# Patient Record
Sex: Female | Born: 1990
Health system: Southern US, Community
[De-identification: ages and names within clinical notes are randomized; demographics above are authoritative.]

## PROBLEM LIST (undated history)

## (undated) DIAGNOSIS — G43909 Migraine, unspecified, not intractable, without status migrainosus: Secondary | ICD-10-CM

## (undated) DIAGNOSIS — R32 Unspecified urinary incontinence: Secondary | ICD-10-CM

## (undated) DIAGNOSIS — J45909 Unspecified asthma, uncomplicated: Secondary | ICD-10-CM

## (undated) DIAGNOSIS — F32A Depression, unspecified: Secondary | ICD-10-CM

## (undated) DIAGNOSIS — F329 Major depressive disorder, single episode, unspecified: Secondary | ICD-10-CM

## (undated) DIAGNOSIS — T7840XA Allergy, unspecified, initial encounter: Secondary | ICD-10-CM

## (undated) DIAGNOSIS — R569 Unspecified convulsions: Secondary | ICD-10-CM

## (undated) DIAGNOSIS — F419 Anxiety disorder, unspecified: Secondary | ICD-10-CM

## (undated) HISTORY — DX: Allergy, unspecified, initial encounter: T78.40XA

## (undated) HISTORY — DX: Anxiety disorder, unspecified: F41.9

## (undated) HISTORY — DX: Unspecified urinary incontinence: R32

## (undated) HISTORY — DX: Major depressive disorder, single episode, unspecified: F32.9

## (undated) HISTORY — DX: Depression, unspecified: F32.A

## (undated) HISTORY — DX: Unspecified convulsions: R56.9

## (undated) HISTORY — PX: WISDOM TOOTH EXTRACTION: SHX21

## (undated) HISTORY — DX: Migraine, unspecified, not intractable, without status migrainosus: G43.909

## (undated) HISTORY — DX: Unspecified asthma, uncomplicated: J45.909

---
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
## 2012-10-18 ENCOUNTER — Other Ambulatory Visit: Payer: Self-pay | Admitting: Family Medicine

## 2012-10-18 DIAGNOSIS — R109 Unspecified abdominal pain: Secondary | ICD-10-CM

## 2012-10-19 ENCOUNTER — Ambulatory Visit
Admission: RE | Admit: 2012-10-19 | Discharge: 2012-10-19 | Disposition: A | Discharge status: Home / Self Care | Payer: BC Managed Care – PPO | Source: Ambulatory Visit | Attending: Family Medicine | Admitting: Family Medicine

## 2012-10-19 DIAGNOSIS — R109 Unspecified abdominal pain: Secondary | ICD-10-CM

## 2012-10-22 ENCOUNTER — Other Ambulatory Visit: Payer: Self-pay

## 2014-10-09 ENCOUNTER — Ambulatory Visit (HOSPITAL_COMMUNITY): Payer: Self-pay | Admitting: Psychiatry

## 2015-01-16 ENCOUNTER — Encounter: Payer: Self-pay | Admitting: Internal Medicine

## 2015-01-16 ENCOUNTER — Other Ambulatory Visit (INDEPENDENT_AMBULATORY_CARE_PROVIDER_SITE_OTHER): Payer: 59

## 2015-01-16 ENCOUNTER — Ambulatory Visit (INDEPENDENT_AMBULATORY_CARE_PROVIDER_SITE_OTHER): Payer: 59 | Admitting: Internal Medicine

## 2015-01-16 VITALS — BP 102/76 | HR 80 | Temp 97.8°F | Resp 16 | Ht 65.0 in | Wt 215.0 lb

## 2015-01-16 DIAGNOSIS — Z0000 Encounter for general adult medical examination without abnormal findings: Secondary | ICD-10-CM | POA: Diagnosis not present

## 2015-01-16 DIAGNOSIS — F4323 Adjustment disorder with mixed anxiety and depressed mood: Secondary | ICD-10-CM | POA: Diagnosis not present

## 2015-01-16 DIAGNOSIS — E669 Obesity, unspecified: Secondary | ICD-10-CM | POA: Insufficient documentation

## 2015-01-16 DIAGNOSIS — R7989 Other specified abnormal findings of blood chemistry: Secondary | ICD-10-CM | POA: Diagnosis not present

## 2015-01-16 LAB — CBC
HCT: 39.9 % (ref 36.0–46.0)
HEMOGLOBIN: 13.6 g/dL (ref 12.0–15.0)
MCHC: 34 g/dL (ref 30.0–36.0)
MCV: 82.4 fl (ref 78.0–100.0)
PLATELETS: 335 10*3/uL (ref 150.0–400.0)
RBC: 4.85 Mil/uL (ref 3.87–5.11)
RDW: 14.1 % (ref 11.5–15.5)
WBC: 9.2 10*3/uL (ref 4.0–10.5)

## 2015-01-16 LAB — COMPREHENSIVE METABOLIC PANEL
ALBUMIN: 3.8 g/dL (ref 3.5–5.2)
ALT: 12 U/L (ref 0–35)
AST: 15 U/L (ref 0–37)
Alkaline Phosphatase: 51 U/L (ref 39–117)
BUN: 8 mg/dL (ref 6–23)
CALCIUM: 9.3 mg/dL (ref 8.4–10.5)
CHLORIDE: 105 meq/L (ref 96–112)
CO2: 22 meq/L (ref 19–32)
Creatinine, Ser: 0.61 mg/dL (ref 0.40–1.20)
GFR: 128.78 mL/min (ref >60.00)
GLUCOSE: 90 mg/dL (ref 70–99)
POTASSIUM: 3.8 meq/L (ref 3.5–5.1)
Sodium: 137 mEq/L (ref 135–145)
TOTAL PROTEIN: 7.1 g/dL (ref 6.0–8.3)
Total Bilirubin: 0.3 mg/dL (ref 0.2–1.2)

## 2015-01-16 LAB — LIPID PANEL
CHOL/HDL RATIO: 4
Cholesterol: 204 mg/dL — ABNORMAL HIGH (ref 0–200)
HDL: 47.8 mg/dL (ref >39.00)
NonHDL: 156.2
Triglycerides: 358 mg/dL — ABNORMAL HIGH (ref 0.0–149.0)
VLDL: 71.6 mg/dL — AB (ref 0.0–40.0)

## 2015-01-16 LAB — LDL CHOLESTEROL, DIRECT: LDL DIRECT: 115 mg/dL

## 2015-01-16 LAB — HEMOGLOBIN A1C: Hgb A1c MFr Bld: 4.7 % (ref 4.6–6.5)

## 2015-01-16 LAB — TSH: TSH: 4.54 u[IU]/mL — ABNORMAL HIGH (ref 0.35–4.50)

## 2015-01-16 MED ORDER — BUPROPION HCL ER (SR) 150 MG PO TB12: 150.0000 mg | ORAL_TABLET | Freq: Two times a day (BID) | ORAL | Status: DC

## 2015-01-16 NOTE — Patient Instructions (Signed)
We have sent in wellbutrin for the anxiety. For the first 5 days take 1 pill in the morning with breakfast. Then take 1 pill with breakfast and 1 pill with dinner. It can take 6-8 weeks for it to get to maximum strength in your body.   It does have a very rare side effect of causing suicidal thoughts and if you get this stop taking the medicine immediately and call our office or seek help.   Come back in about 2-3 months to check on how you are doing. If you have problems or questions before then please call the office.   We will check your blood work today and call you back with the results.   Think about going back to your therapist during this stressful time.  Stress and Stress Management Stress is a normal reaction to life events. It is what you feel when life demands more than you are used to or more than you can handle. Some stress can be useful. For example, the stress reaction can help you catch the last bus of the day, study for a test, or meet a deadline at work. But stress that occurs too often or for too long can cause problems. It can affect your emotional health and interfere with relationships and normal daily activities. Too much stress can weaken your immune system and increase your risk for physical illness. If you already have a medical problem, stress can make it worse. CAUSES  All sorts of life events may cause stress. An event that causes stress for one person may not be stressful for another person. Major life events commonly cause stress. These may be positive or negative. Examples include losing your job, moving into a new home, getting married, having a baby, or losing a loved one. Less obvious life events may also cause stress, especially if they occur day after day or in combination. Examples include working long hours, driving in traffic, caring for children, being in debt, or being in a difficult relationship. SIGNS AND SYMPTOMS Stress may cause emotional symptoms  including, the following:  Anxiety. This is feeling worried, afraid, on edge, overwhelmed, or out of control.  Anger. This is feeling irritated or impatient.  Depression. This is feeling sad, down, helpless, or guilty.  Difficulty focusing, remembering, or making decisions. Stress may cause physical symptoms, including the following:   Aches and pains. These may affect your head, neck, back, stomach, or other areas of your body.  Tight muscles or clenched jaw.  Low energy or trouble sleeping. Stress may cause unhealthy behaviors, including the following:   Eating to feel better (overeating) or skipping meals.  Sleeping too little, too much, or both.  Working too much or putting off tasks (procrastination).  Smoking, drinking alcohol, or using drugs to feel better. DIAGNOSIS  Stress is diagnosed through an assessment by your health care provider. Your health care provider will ask questions about your symptoms and any stressful life events.Your health care provider will also ask about your medical history and may order blood tests or other tests. Certain medical conditions and medicine can cause physical symptoms similar to stress. Mental illness can cause emotional symptoms and unhealthy behaviors similar to stress. Your health care provider may refer you to a mental health professional for further evaluation.  TREATMENT  Stress management is the recommended treatment for stress.The goals of stress management are reducing stressful life events and coping with stress in healthy ways.  Techniques for reducing stressful life events  include the following:  Stress identification. Self-monitor for stress and identify what causes stress for you. These skills may help you to avoid some stressful events.  Time management. Set your priorities, keep a calendar of events, and learn to say "no." These tools can help you avoid making too many commitments. Techniques for coping with stress  include the following:  Rethinking the problem. Try to think realistically about stressful events rather than ignoring them or overreacting. Try to find the positives in a stressful situation rather than focusing on the negatives.  Exercise. Physical exercise can release both physical and emotional tension. The key is to find a form of exercise you enjoy and do it regularly.  Relaxation techniques. These relax the body and mind. Examples include yoga, meditation, tai chi, biofeedback, deep breathing, progressive muscle relaxation, listening to music, being out in nature, journaling, and other hobbies. Again, the key is to find one or more that you enjoy and can do regularly.  Healthy lifestyle. Eat a balanced diet, get plenty of sleep, and do not smoke. Avoid using alcohol or drugs to relax.  Strong support network. Spend time with family, friends, or other people you enjoy being around.Express your feelings and talk things over with someone you trust. Counseling or talktherapy with a mental health professional may be helpful if you are having difficulty managing stress on your own. Medicine is typically not recommended for the treatment of stress.Talk to your health care provider if you think you need medicine for symptoms of stress. HOME CARE INSTRUCTIONS  Keep all follow-up visits as directed by your health care provider.  Take all medicines as directed by your health care provider. SEEK MEDICAL CARE IF:  Your symptoms get worse or you start having new symptoms.  You feel overwhelmed by your problems and can no longer manage them on your own. SEEK IMMEDIATE MEDICAL CARE IF:  You feel like hurting yourself or someone else. Document Released: 01/07/2001 Document Revised: 11/28/2013 Document Reviewed: 03/08/2013 [facility] Patient Information 2015 [place], [place]. This information is not intended to replace advice given to you by your health care provider. Make sure you discuss any  questions you have with your health care provider.

## 2015-01-16 NOTE — Assessment & Plan Note (Signed)
Checking labs today for signs of complications from her weight. BP okay today.

## 2015-01-16 NOTE — Progress Notes (Signed)
Pre visit review using our clinic review tool, if applicable. No additional management support is needed unless otherwise documented below in the visit note. 

## 2015-01-16 NOTE — Assessment & Plan Note (Addendum)
The patient has tried several agents in the past. Will try wellbutrin as she wishes to avoid weight gain. Advised her that she needs to go back to counseling as she is going through a lot right now. Talked to her about the need to avoid medications like xanax and other benzos as she is young and these can be habit forming medicines.

## 2015-01-16 NOTE — Progress Notes (Signed)
   Subjective:    Patient ID: Carolyn Miranda, female    DOB: 1990-08-24, 24 y.o.   MRN: [id number]  HPI The patient is a 24 YO female here for anxiety. She is dealing with several new life stressors right now including loss of a friend, moving back in with her mom, and being jobless right now. She has been tried on zoloft in the past which made her feel like a zombie and prozac which did not do well. See has seen a therapist in the past but has not been in more than 6 months. It has been helpful to her in the past. She does get increasing thoughts and anxiety from life events and sometimes it keeps her from sleeping.   PMH, [facility], social history reviewed and updated during visit.   Review of Systems  Constitutional: Negative for fever, activity change and fatigue.  HENT: Negative.   Eyes: Negative.   Respiratory: Negative for cough, chest tightness, shortness of breath and wheezing.   Cardiovascular: Negative for chest pain, palpitations and leg swelling.  Gastrointestinal: Negative for nausea, abdominal pain, diarrhea, constipation and abdominal distention.  Musculoskeletal: Negative.   Skin: Negative.   Neurological: Negative.   Psychiatric/Behavioral: Positive for sleep disturbance, dysphoric mood and agitation. Negative for suicidal ideas, behavioral problems, self-injury and decreased concentration. The patient is nervous/anxious and is hyperactive.       Objective:   Physical Exam  Constitutional: She is oriented to person, place, and time. She appears well-developed and well-nourished.  Overweight  HENT:  Head: Normocephalic and atraumatic.  Eyes: EOM are normal.  Neck: Normal range of motion.  Cardiovascular: Normal rate and regular rhythm.   Pulmonary/Chest: Effort normal. No respiratory distress. She has no wheezes. She has no rales.  Abdominal: Soft. She exhibits no distension. There is no tenderness. There is no rebound.  Musculoskeletal: She exhibits no edema.    Neurological: She is alert and oriented to person, place, and time. Coordination normal.  Skin: Skin is warm and dry.  Psychiatric: She has a normal mood and affect.   Filed Vitals:   01/16/15 0856  BP: 102/76  Pulse: 80  Temp: 97.8 F (36.6 C)  TempSrc: Oral  Resp: 16  Height: 5\' 5"  (1.651 m)  Weight: 215 lb (97.523 kg)  SpO2: 98%      Assessment & Plan:

## 2015-01-19 ENCOUNTER — Other Ambulatory Visit: Payer: Self-pay | Admitting: Internal Medicine

## 2015-01-19 ENCOUNTER — Telehealth: Payer: Self-pay | Admitting: Internal Medicine

## 2015-01-19 DIAGNOSIS — R7989 Other specified abnormal findings of blood chemistry: Secondary | ICD-10-CM

## 2015-01-19 NOTE — Telephone Encounter (Signed)
Please call patient with her lab results.

## 2015-01-25 ENCOUNTER — Other Ambulatory Visit (INDEPENDENT_AMBULATORY_CARE_PROVIDER_SITE_OTHER): Payer: 59

## 2015-01-25 DIAGNOSIS — R7989 Other specified abnormal findings of blood chemistry: Secondary | ICD-10-CM

## 2015-01-25 LAB — T4, FREE: FREE T4: 0.59 ng/dL — AB (ref 0.60–1.60)

## 2015-02-18 ENCOUNTER — Other Ambulatory Visit: Payer: Self-pay | Admitting: Internal Medicine

## 2015-02-19 ENCOUNTER — Telehealth: Payer: Self-pay | Admitting: Internal Medicine

## 2015-02-19 NOTE — Telephone Encounter (Signed)
Pharmacy has sent a request in for Trinessa. Is this the same as the ortho tri cyclen that she always take? Please advise

## 2015-03-16 ENCOUNTER — Other Ambulatory Visit: Payer: Self-pay | Admitting: Internal Medicine

## 2015-03-19 ENCOUNTER — Ambulatory Visit: Payer: 59 | Admitting: Internal Medicine

## 2015-03-21 ENCOUNTER — Ambulatory Visit: Payer: 59 | Admitting: Internal Medicine

## 2015-04-03 ENCOUNTER — Ambulatory Visit: Payer: 59 | Admitting: Internal Medicine

## 2015-04-12 ENCOUNTER — Other Ambulatory Visit: Payer: Self-pay | Admitting: Internal Medicine

## 2015-04-12 ENCOUNTER — Ambulatory Visit: Payer: 59 | Admitting: Internal Medicine

## 2015-04-20 ENCOUNTER — Encounter: Payer: Self-pay | Admitting: Family

## 2015-04-20 ENCOUNTER — Ambulatory Visit (INDEPENDENT_AMBULATORY_CARE_PROVIDER_SITE_OTHER): Payer: 59 | Admitting: Family

## 2015-04-20 VITALS — BP 138/88 | HR 99 | Temp 97.6°F | Ht 65.0 in | Wt 207.0 lb

## 2015-04-20 DIAGNOSIS — J4520 Mild intermittent asthma, uncomplicated: Secondary | ICD-10-CM | POA: Diagnosis not present

## 2015-04-20 DIAGNOSIS — J069 Acute upper respiratory infection, unspecified: Secondary | ICD-10-CM

## 2015-04-20 DIAGNOSIS — J45909 Unspecified asthma, uncomplicated: Secondary | ICD-10-CM | POA: Insufficient documentation

## 2015-04-20 MED ORDER — ALBUTEROL SULFATE HFA 108 (90 BASE) MCG/ACT IN AERS: 2.0000 | INHALATION_SPRAY | Freq: Four times a day (QID) | RESPIRATORY_TRACT | Status: DC | PRN

## 2015-04-20 MED ORDER — AZITHROMYCIN 250 MG PO TABS
ORAL_TABLET | ORAL | Status: DC
Start: 2015-04-20 — End: 2015-08-24

## 2015-04-20 NOTE — Progress Notes (Signed)
Pre visit review using our clinic review tool, if applicable. No additional management support is needed unless otherwise documented below in the visit note. 

## 2015-04-20 NOTE — Patient Instructions (Addendum)
Thank you for choosing [facility].  Summary/Instructions:  Your prescription(s) have been submitted to your pharmacy or been printed and provided for you. Please take as directed and contact our office if you believe you are having problem(s) with the medication(s) or have any questions.  If your symptoms worsen or fail to improve, please contact our office for further instruction, or in case of emergency go directly to the emergency room at the closest medical facility.   General Recommendations:    Please drink plenty of fluids.  Get plenty of rest   Sleep in humidified air  Use saline nasal sprays  Netti pot   OTC Medications:  Decongestants - helps relieve congestion   Flonase (generic fluticasone) or Nasacort (generic triamcinolone) - please make sure to use the "cross-over" technique at a 45 degree angle towards the opposite eye as opposed to straight up the nasal passageway.   Sudafed (generic pseudoephedrine - Note this is the one that is available behind the pharmacy counter); Products with phenylephrine (-PE) may also be used but is often not as effective as pseudoephedrine.   If you have HIGH BLOOD PRESSURE - Coricidin HBP; AVOID any product that is -D as this contains pseudoephedrine which may increase your blood pressure.  Afrin (oxymetazoline) every 6-8 hours for up to 3 days.   Allergies - helps relieve runny nose, itchy eyes and sneezing   Claritin (generic loratidine), Allegra (fexofenidine), or Zyrtec (generic cyrterizine) for runny nose. These medications should not cause drowsiness.  Note - Benadryl (generic diphenhydramine) may be used however may cause drowsiness  Cough -   Delsym or Robitussin (generic dextromethorphan)  Expectorants - helps loosen mucus to ease removal   Mucinex (generic guaifenesin) as directed on the package.  Headaches / General Aches   Tylenol (generic acetaminophen) - DO NOT EXCEED 3 grams (3,000 mg) in a 24  hour time period  Advil/Motrin (generic ibuprofen)   Sore Throat -   Salt water gargle   Chloraseptic (generic benzocaine) spray or lozenges / Sucrets (generic dyclonine)    Upper Respiratory Infection, Adult An upper respiratory infection (URI) is also sometimes known as the common cold. The upper respiratory tract includes the nose, sinuses, throat, trachea, and bronchi. Bronchi are the airways leading to the lungs. Most people improve within 1 week, but symptoms can last up to 2 weeks. A residual cough may last even longer.  CAUSES Many different viruses can infect the tissues lining the upper respiratory tract. The tissues become irritated and inflamed and often become very moist. Mucus production is also common. A cold is contagious. You can easily spread the virus to others by oral contact. This includes kissing, sharing a glass, coughing, or sneezing. Touching your mouth or nose and then touching a surface, which is then touched by another person, can also spread the virus. SYMPTOMS  Symptoms typically develop 1 to 3 days after you come in contact with a cold virus. Symptoms vary from person to person. They may include:  Runny nose.  Sneezing.  Nasal congestion.  Sinus irritation.  Sore throat.  Loss of voice (laryngitis).  Cough.  Fatigue.  Muscle aches.  Loss of appetite.  Headache.  Low-grade fever. DIAGNOSIS  You might diagnose your own cold based on familiar symptoms, since most people get a cold 2 to 3 times a year. Your caregiver can confirm this based on your exam. Most importantly, your caregiver can check that your symptoms are not due to another disease such as   strep throat, sinusitis, pneumonia, asthma, or epiglottitis. Blood tests, throat tests, and X-rays are not necessary to diagnose a common cold, but they may sometimes be helpful in excluding other more serious diseases. Your caregiver will decide if any further tests are required. RISKS AND  COMPLICATIONS  You may be at risk for a more severe case of the common cold if you smoke cigarettes, have chronic heart disease (such as heart failure) or lung disease (such as asthma), or if you have a weakened immune system. The very young and very old are also at risk for more serious infections. Bacterial sinusitis, middle ear infections, and bacterial pneumonia can complicate the common cold. The common cold can worsen asthma and chronic obstructive pulmonary disease (COPD). Sometimes, these complications can require emergency medical care and may be life-threatening. PREVENTION  The best way to protect against getting a cold is to practice good hygiene. Avoid oral or hand contact with people with cold symptoms. Wash your hands often if contact occurs. There is no clear evidence that vitamin C, vitamin E, echinacea, or exercise reduces the chance of developing a cold. However, it is always recommended to get plenty of rest and practice good nutrition. TREATMENT  Treatment is directed at relieving symptoms. There is no cure. Antibiotics are not effective, because the infection is caused by a virus, not by bacteria. Treatment may include:  Increased fluid intake. Sports drinks offer valuable electrolytes, sugars, and fluids.  Breathing heated mist or steam (vaporizer or shower).  Eating chicken soup or other clear broths, and maintaining good nutrition.  Getting plenty of rest.  Using gargles or lozenges for comfort.  Controlling fevers with ibuprofen or acetaminophen as directed by your caregiver.  Increasing usage of your inhaler if you have asthma. Zinc gel and zinc lozenges, taken in the first 24 hours of the common cold, can shorten the duration and lessen the severity of symptoms. Pain medicines may help with fever, muscle aches, and throat pain. A variety of non-prescription medicines are available to treat congestion and runny nose. Your caregiver can make recommendations and may  suggest nasal or lung inhalers for other symptoms.  HOME CARE INSTRUCTIONS   Only take over-the-counter or prescription medicines for pain, discomfort, or fever as directed by your caregiver.  Use a warm mist humidifier or inhale steam from a shower to increase air moisture. This may keep secretions moist and make it easier to breathe.  Drink enough water and fluids to keep your urine clear or pale yellow.  Rest as needed.  Return to work when your temperature has returned to normal or as your caregiver advises. You may need to stay home longer to avoid infecting others. You can also use a face mask and careful hand washing to prevent spread of the virus. SEEK MEDICAL CARE IF:   After the first few days, you feel you are getting worse rather than better.  You need your caregiver's advice about medicines to control symptoms.  You develop chills, worsening shortness of breath, or brown or red sputum. These may be signs of pneumonia.  You develop yellow or brown nasal discharge or pain in the face, especially when you bend forward. These may be signs of sinusitis.  You develop a fever, swollen neck glands, pain with swallowing, or white areas in the back of your throat. These may be signs of strep throat. SEEK IMMEDIATE MEDICAL CARE IF:   You have a fever.  You develop severe or persistent headache, ear pain,   sinus pain, or chest pain.  You develop wheezing, a prolonged cough, cough up blood, or have a change in your usual mucus (if you have chronic lung disease).  You develop sore muscles or a stiff neck. Document Released: 01/07/2001 Document Revised: 10/06/2011 Document Reviewed: 10/19/2013 [employer] Patient Information 2015 [employer]. This information is not intended to replace advice given to you by your health care provider. Make sure you discuss any questions you have with your health care provider.   

## 2015-04-20 NOTE — Assessment & Plan Note (Signed)
Symptoms and exam consistent with acute upper respiratory infection most likely viral. Prescription for azithromycin provided to patient with instructions for watchful waiting. Continue over-the-counter medications as needed for symptom relief and supportive care. Start antibiotic if symptoms worsen. Follow up if symptoms worsen or fail to improve.

## 2015-04-20 NOTE — Assessment & Plan Note (Signed)
Requests refill of albuterol. Prescription sent to pharmacy.

## 2015-04-20 NOTE — Progress Notes (Signed)
Subjective:    Patient ID: Carolyn Miranda, female    DOB: 11-29-1990, 24 y.o.   MRN: [id number]  Chief Complaint  Patient presents with  . Cough    Congestion, fatigue, and cough x 1 week     HPI:  Carolyn Miranda is a 24 y.o. female who  has a past medical history of Anxiety; Asthma; and Depression. and presents today for an acute office visit.   This is a new problem. Associated symptoms fatigue, congestion, and mild cough that has been going on for about 1 week. Modifying factors include Dayquil which did not help very much. Course of the illness is with improvement since the weekend. Timing of the symptoms is worse in the morning with coughing. Believes it is effecting her asthma which has been worse. Denies any recent antibiotic use.   No Known Allergies   Current Outpatient Prescriptions on File Prior to Visit  Medication Sig Dispense Refill  . ORTHO TRI-CYCLEN, 28, 0.18/0.215/0.25 MG-35 MCG tablet USE AS DIRECTED, NEED APPOINTMENT FOR ADDITONAL REFILLS 28 tablet 0   No current facility-administered medications on file prior to visit.    No past surgical history on file.   Review of Systems  Constitutional: Negative for fever and chills.  HENT: Positive for congestion, rhinorrhea and sneezing. Negative for ear pain, sinus pressure and sore throat.   Respiratory: Positive for shortness of breath. Negative for chest tightness and wheezing.   Cardiovascular: Negative for chest pain, palpitations and leg swelling.  Neurological: Negative for headaches.      Objective:    BP 138/88 mmHg  Pulse 99  Temp(Src) 97.6 F (36.4 C) (Oral)  Ht  (1.651 m)  Wt 207 lb (93.895 kg)  BMI 34.45 kg/m2  SpO2 97%  LMP 04/16/2015 Nursing note and vital signs reviewed.  Physical Exam  Constitutional: She is oriented to person, place, and time. She appears well-developed and well-nourished. No distress.  HENT:  Right Ear: Hearing, tympanic membrane, external ear and ear  canal normal.  Left Ear: Hearing, tympanic membrane, external ear and ear canal normal.  Nose: Nose normal. Right sinus exhibits no maxillary sinus tenderness and no frontal sinus tenderness. Left sinus exhibits no maxillary sinus tenderness and no frontal sinus tenderness.  Mouth/Throat: Uvula is midline, oropharynx is clear and moist and mucous membranes are normal.  Neck: Neck supple.  Cardiovascular: Normal rate, regular rhythm, normal heart sounds and intact distal pulses.   Pulmonary/Chest: Effort normal and breath sounds normal. She has no wheezes.  Lymphadenopathy:    She has no cervical adenopathy.  Neurological: She is alert and oriented to person, place, and time.  Skin: Skin is warm and dry.  Psychiatric: She has a normal mood and affect. Her behavior is normal. Judgment and thought content normal.       Assessment & Plan:   Problem List Items Addressed This Visit      Respiratory   Acute upper respiratory infection - Primary    Symptoms and exam consistent with acute upper respiratory infection most likely viral. Prescription for azithromycin provided to patient with instructions for watchful waiting. Continue over-the-counter medications as needed for symptom relief and supportive care. Start antibiotic if symptoms worsen. Follow up if symptoms worsen or fail to improve.      Relevant Medications   azithromycin (ZITHROMAX) 250 MG tablet   Asthma    Requests refill of albuterol. Prescription sent to pharmacy.       Relevant Medications  albuterol (PROVENTIL HFA;VENTOLIN HFA) 108 (90 BASE) MCG/ACT inhaler

## 2015-04-27 ENCOUNTER — Telehealth: Payer: Self-pay | Admitting: *Deleted

## 2015-04-27 NOTE — Telephone Encounter (Signed)
Called pt again still no answer LMOM if needing please return call. Closing encounter...Raechel Chute

## 2015-04-27 NOTE — Telephone Encounter (Signed)
Left msg on triage Thursday afternoon @ 4:45pm stating not feeling any better wanting to speak with nurse. Called pt this am no answer LMOM RTC...Raechel Chute

## 2015-05-12 ENCOUNTER — Other Ambulatory Visit: Payer: Self-pay | Admitting: Internal Medicine

## 2015-06-10 ENCOUNTER — Other Ambulatory Visit: Payer: Self-pay | Admitting: Internal Medicine

## 2015-08-22 ENCOUNTER — Other Ambulatory Visit: Payer: Self-pay | Admitting: Internal Medicine

## 2015-08-24 ENCOUNTER — Encounter: Payer: Self-pay | Admitting: Internal Medicine

## 2015-08-24 ENCOUNTER — Ambulatory Visit (INDEPENDENT_AMBULATORY_CARE_PROVIDER_SITE_OTHER): Payer: BLUE CROSS/BLUE SHIELD | Admitting: Internal Medicine

## 2015-08-24 VITALS — BP 120/80 | HR 73 | Temp 97.4°F | Ht 65.0 in | Wt 202.0 lb

## 2015-08-24 DIAGNOSIS — Z308 Encounter for other contraceptive management: Secondary | ICD-10-CM | POA: Diagnosis not present

## 2015-08-24 DIAGNOSIS — F4323 Adjustment disorder with mixed anxiety and depressed mood: Secondary | ICD-10-CM | POA: Diagnosis not present

## 2015-08-24 DIAGNOSIS — Z309 Encounter for contraceptive management, unspecified: Secondary | ICD-10-CM | POA: Insufficient documentation

## 2015-08-24 MED ORDER — BUSPIRONE HCL 5 MG PO TABS: 5.0000 mg | ORAL_TABLET | Freq: Two times a day (BID) | ORAL | Status: DC | PRN

## 2015-08-24 NOTE — Progress Notes (Signed)
   Subjective:    Patient ID: Carolyn Miranda, female    DOB: 21-Apr-1991, 25 y.o.   MRN: [id number]  HPI The patient is a 25 YO female coming in to talk about birth control. She is taking OCP pills right now and has a hard time remembering to take them. She has heard about mirena from a a friend and wants to know more about it. Not currently sexually active. No concerns about pregnancy.   Review of Systems  Constitutional: Negative for fever, activity change and fatigue.  Respiratory: Negative for cough, chest tightness, shortness of breath and wheezing.   Cardiovascular: Negative for chest pain, palpitations and leg swelling.  Gastrointestinal: Negative for nausea, abdominal pain, diarrhea, constipation and abdominal distention.  Musculoskeletal: Negative.   Skin: Negative.   Neurological: Negative.   Psychiatric/Behavioral: Positive for dysphoric mood. Negative for suicidal ideas, behavioral problems, self-injury and decreased concentration. The patient is nervous/anxious.       Objective:   Physical Exam  Constitutional: She is oriented to person, place, and time. She appears well-developed and well-nourished.  Overweight  HENT:  Head: Normocephalic and atraumatic.  Eyes: EOM are normal.  Neck: Normal range of motion.  Cardiovascular: Normal rate and regular rhythm.   Pulmonary/Chest: Effort normal. No respiratory distress. She has no wheezes. She has no rales.  Abdominal: Soft. She exhibits no distension. There is no tenderness. There is no rebound.  Musculoskeletal: She exhibits no edema.  Neurological: She is alert and oriented to person, place, and time. Coordination normal.  Skin: Skin is warm and dry.  Psychiatric: She has a normal mood and affect.   Filed Vitals:   08/24/15 1115  BP: 120/80  Pulse: 73  Temp: 97.4 F (36.3 C)  TempSrc: Oral  Height:  (1.651 m)  Weight: 202 lb (91.627 kg)  SpO2: 95%      Assessment & Plan:

## 2015-08-24 NOTE — Progress Notes (Signed)
Pre visit review using our clinic review tool, if applicable. No additional management support is needed unless otherwise documented below in the visit note. 

## 2015-08-24 NOTE — Assessment & Plan Note (Signed)
Currently taking OCP pills but has a hard time remembering to take daily. Talked to her about various other options and information provided. Recommended that she see a gynecologist who could do the IUD or implant if she decides to switch. She will do that.

## 2015-08-24 NOTE — Patient Instructions (Signed)
We have sent in the buspar which you can take as needed for anxiety.   We have given you information about birth control options. It would be a good idea to get a gynecologist to talk to birth controls. [facility] and [facility] are good groups that are taking new patients. They both have websites that have pictures of the doctors.

## 2015-08-24 NOTE — Assessment & Plan Note (Signed)
Did not do well with wellbutrin and does not wish to try additional agent at this time. Rx for buspar for as needed. Recommended to start seeing her counselor again for help.

## 2015-08-27 ENCOUNTER — Telehealth: Payer: Self-pay | Admitting: Internal Medicine

## 2015-08-27 ENCOUNTER — Other Ambulatory Visit: Payer: Self-pay

## 2015-08-27 ENCOUNTER — Other Ambulatory Visit: Payer: Self-pay | Admitting: Internal Medicine

## 2015-08-27 MED ORDER — ORTHO TRI-CYCLEN (28) 0.18/0.215/0.25 MG-35 MCG PO TABS: 1.0000 | ORAL_TABLET | Freq: Every day | ORAL | Status: DC

## 2015-08-27 NOTE — Telephone Encounter (Signed)
erx sent.   My chart message also sent.

## 2015-08-27 NOTE — Telephone Encounter (Signed)
Pt mother called in and said that there birth control needs to called in to pharmacy for the generic and also her med for anxiety   Pharmacy - [facility] [address]

## 2015-08-27 NOTE — Telephone Encounter (Signed)
Medications have already been sent.  My chart message sent stating the same.

## 2015-08-27 NOTE — Telephone Encounter (Signed)
erx resent.   My chart sent with same information.

## 2015-08-27 NOTE — Telephone Encounter (Signed)
Patient called to request that we send buspar and ortho tricyclen to [facility] on [address]. She also states that they would not allow her to get the generics because of the way that we send them in.

## 2015-09-14 ENCOUNTER — Ambulatory Visit (INDEPENDENT_AMBULATORY_CARE_PROVIDER_SITE_OTHER): Payer: BLUE CROSS/BLUE SHIELD | Admitting: Nurse Practitioner

## 2015-09-14 ENCOUNTER — Encounter: Payer: Self-pay | Admitting: Nurse Practitioner

## 2015-09-14 VITALS — BP 124/88 | HR 96 | Temp 97.8°F | Resp 18 | Wt 200.0 lb

## 2015-09-14 DIAGNOSIS — J4520 Mild intermittent asthma, uncomplicated: Secondary | ICD-10-CM | POA: Diagnosis not present

## 2015-09-14 DIAGNOSIS — J069 Acute upper respiratory infection, unspecified: Secondary | ICD-10-CM | POA: Diagnosis not present

## 2015-09-14 MED ORDER — ALBUTEROL SULFATE HFA 108 (90 BASE) MCG/ACT IN AERS: 2.0000 | INHALATION_SPRAY | Freq: Four times a day (QID) | RESPIRATORY_TRACT | Status: DC | PRN

## 2015-09-14 MED ORDER — AZITHROMYCIN 250 MG PO TABS: ORAL_TABLET | ORAL | Status: DC

## 2015-09-14 NOTE — Patient Instructions (Addendum)
Inhaler first!  Z-pack if fever of 100.5 or greater and cough gets worse (Use back up birth control if use the antibiotic)   Continue OTC Nyquil/Dayquil   Seek emergency care if you worsen into an asthma attack or can't complete sentences.

## 2015-09-14 NOTE — Assessment & Plan Note (Addendum)
New onset Will treat conservatively due to probable viral nature Inhaler sent to pharmacy  Mucinex plain and benadryl at night encouraged  Antibiotic prescription sent to pharmacy in case of no improvement or fever within 48 hours. Pt verbalized understanding of need to hold.  FU prn worsening/failure to improve.

## 2015-09-14 NOTE — Progress Notes (Signed)
Patient ID: Carolyn Miranda, female    DOB: 12/14/1990  Age: 25 y.o. MRN: [id number]  CC: Sore Throat   HPI Carolyn Miranda presents for ST x 4 days.   1) Pt reports sneezing, ST Cough- no production  H/o Asthma- needs inhaler refilled  Sleeping okay now  Treatment to date: OTC allergy medications not helpful  [facility] Nyquil/dayniquil   Sick contacts: Denies, maybe 1 friend  History Carolyn Miranda has a past medical history of Anxiety; Asthma; and Depression.   Carolyn Miranda has no past surgical history on file.   Her family history includes Mental illness in her mother.Carolyn Miranda reports that Carolyn Miranda has never smoked. Carolyn Miranda does not have any smokeless tobacco history on file. Carolyn Miranda reports that Carolyn Miranda does not drink alcohol or use illicit drugs.  Outpatient Prescriptions Prior to Visit  Medication Sig Dispense Refill  . busPIRone (BUSPAR) 5 MG tablet Take 1 tablet (5 mg total) by mouth 2 (two) times daily as needed. 60 tablet 0  . ORTHO TRI-CYCLEN, 28, 0.18/0.215/0.25 MG-35 MCG tablet Take 1 tablet by mouth daily. 28 tablet 5  . albuterol (PROVENTIL HFA;VENTOLIN HFA) 108 (90 BASE) MCG/ACT inhaler Inhale 2 puffs into the lungs every 6 (six) hours as needed for wheezing or shortness of breath. 1 Inhaler 1   No facility-administered medications prior to visit.    ROS Review of Systems  Constitutional: Positive for fatigue. Negative for fever, chills and diaphoresis.  HENT: Positive for congestion, postnasal drip, rhinorrhea, sneezing and sore throat. Negative for ear pain, trouble swallowing and voice change.   Eyes: Negative for visual disturbance.  Respiratory: Positive for cough and wheezing. Negative for chest tightness and shortness of breath.   Cardiovascular: Negative for chest pain, palpitations and leg swelling.  Gastrointestinal: Negative for nausea, vomiting and diarrhea.  Skin: Negative for rash.  Neurological: Negative for dizziness and headaches.    Objective:  BP 124/88 mmHg  Pulse 96   Temp(Src) 97.8 F (36.6 C) (Oral)  Resp 18  Wt 200 lb (90.719 kg)  SpO2 97%  Physical Exam  Constitutional: Carolyn Miranda is oriented to person, place, and time. Carolyn Miranda appears well-developed and well-nourished. No distress.  HENT:  Head: Normocephalic and atraumatic.  Right Ear: External ear normal.  Left Ear: External ear normal.  Mouth/Throat: Oropharynx is clear and moist. No oropharyngeal exudate.  TM's clear bilaterally  Eyes: EOM are normal. Pupils are equal, round, and reactive to light. Right eye exhibits no discharge. Left eye exhibits no discharge. No scleral icterus.  Neck: Normal range of motion. Neck supple.  Cardiovascular: Normal rate, regular rhythm and normal heart sounds.  Exam reveals no gallop and no friction rub.   No murmur heard. Pulmonary/Chest: Effort normal. No respiratory distress. Carolyn Miranda has wheezes. Carolyn Miranda has no rales. Carolyn Miranda exhibits no tenderness.  Lymphadenopathy:    Carolyn Miranda has no cervical adenopathy.  Neurological: Carolyn Miranda is alert and oriented to person, place, and time.  Skin: Skin is warm and dry. No rash noted. Carolyn Miranda is not diaphoretic.  Psychiatric: Carolyn Miranda has a normal mood and affect. Her behavior is normal. Judgment and thought content normal.   Assessment & Plan:   Carolyn Miranda was seen today for sore throat.  Diagnoses and all orders for this visit:  Asthma, mild intermittent, uncomplicated -     albuterol (PROVENTIL HFA;VENTOLIN HFA) 108 (90 Base) MCG/ACT inhaler; Inhale 2 puffs into the lungs every 6 (six) hours as needed for wheezing or shortness of breath.  Acute upper respiratory infection  Other orders -  azithromycin (ZITHROMAX) 250 MG tablet; Take 2 tablets by mouth on day 1, take 1 tablet by mouth each day after for 4 days.   I am having Carolyn Miranda start on azithromycin. I am also having her maintain her busPIRone, ORTHO TRI-CYCLEN (28), and albuterol.  Meds ordered this encounter  Medications  . albuterol (PROVENTIL HFA;VENTOLIN HFA) 108 (90 Base)  MCG/ACT inhaler    Sig: Inhale 2 puffs into the lungs every 6 (six) hours as needed for wheezing or shortness of breath.    Dispense:  1 Inhaler    Refill:  1    Order Specific Question:  Supervising Provider    Answer:  Darrick Huntsman, TERESA L [[id number]]  . azithromycin (ZITHROMAX) 250 MG tablet    Sig: Take 2 tablets by mouth on day 1, take 1 tablet by mouth each day after for 4 days.    Dispense:  6 each    Refill:  0    Order Specific Question:  Supervising Provider    Answer:  Sherlene Shams [[id number]]     Follow-up: Return if symptoms worsen or fail to improve.

## 2015-09-14 NOTE — Progress Notes (Signed)
Pre visit review using our clinic review tool, if applicable. No additional management support is needed unless otherwise documented below in the visit note. 

## 2015-11-29 ENCOUNTER — Encounter: Payer: Self-pay | Admitting: Family

## 2015-11-29 ENCOUNTER — Ambulatory Visit (INDEPENDENT_AMBULATORY_CARE_PROVIDER_SITE_OTHER)
Admission: RE | Admit: 2015-11-29 | Discharge: 2015-11-29 | Disposition: A | Discharge status: Home / Self Care | Payer: BLUE CROSS/BLUE SHIELD | Source: Ambulatory Visit | Attending: Family | Admitting: Family

## 2015-11-29 ENCOUNTER — Ambulatory Visit (INDEPENDENT_AMBULATORY_CARE_PROVIDER_SITE_OTHER): Payer: BLUE CROSS/BLUE SHIELD | Admitting: Family

## 2015-11-29 VITALS — BP 112/68 | HR 72 | Temp 98.3°F | Resp 14 | Ht 65.0 in | Wt 197.4 lb

## 2015-11-29 DIAGNOSIS — M542 Cervicalgia: Secondary | ICD-10-CM | POA: Diagnosis not present

## 2015-11-29 MED ORDER — CYCLOBENZAPRINE HCL 10 MG PO TABS: 10.0000 mg | ORAL_TABLET | Freq: Every evening | ORAL | Status: DC | PRN

## 2015-11-29 NOTE — Progress Notes (Signed)
Pre visit review using our clinic review tool, if applicable. No additional management support is needed unless otherwise documented below in the visit note. 

## 2015-11-29 NOTE — Progress Notes (Signed)
Subjective:    Patient ID: Carolyn Miranda, female    DOB: 27-Aug-1990, 25 y.o.   MRN: [id number]   Carolyn Miranda is a 25 y.o. female who presents today for an acute visit.    HPI Comments: Patient here for evaluation of neck pain/sprain, improved, for 4 days. Worse at the end of the day and hurts to turn neck side to side. [place] [employer] [place], denies injury, and pain started the following day. Worries that it is posture related from large breasts. C/o back pain from breasts and would like breast reduction at some point. Doesn't think she sleep wrong on her neck. Heat and ice, NSAIDs, have helped with neck pain.    Does not drink alcohol. No history of diabetes.  Past Medical History  Diagnosis Date  . Anxiety   . Asthma   . Depression    Allergies: Review of patient's allergies indicates no known allergies. Current Outpatient Prescriptions on File Prior to Visit  Medication Sig Dispense Refill  . albuterol (PROVENTIL HFA;VENTOLIN HFA) 108 (90 Base) MCG/ACT inhaler Inhale 2 puffs into the lungs every 6 (six) hours as needed for wheezing or shortness of breath. 1 Inhaler 1  . ORTHO TRI-CYCLEN, 28, 0.18/0.215/0.25 MG-35 MCG tablet Take 1 tablet by mouth daily. 28 tablet 5  . azithromycin (ZITHROMAX) 250 MG tablet Take 2 tablets by mouth on day 1, take 1 tablet by mouth each day after for 4 days. (Patient not taking: Reported on 11/29/2015) 6 each 0  . busPIRone (BUSPAR) 5 MG tablet Take 1 tablet (5 mg total) by mouth 2 (two) times daily as needed. (Patient not taking: Reported on 11/29/2015) 60 tablet 0   No current facility-administered medications on file prior to visit.    Social History  Substance Use Topics  . Smoking status: Never Smoker   . Smokeless tobacco: None  . Alcohol Use: No    Review of Systems  Constitutional: Negative for fever and chills.  HENT: Negative for congestion.   Respiratory: Negative for cough.   Musculoskeletal: Positive for  neck pain and neck stiffness.  Neurological: Negative for dizziness, syncope, light-headedness and headaches.      Objective:    BP 112/68 mmHg  Pulse 72  Temp(Src) 98.3 F (36.8 C) (Oral)  Resp 14  Ht 5\' 5"  (1.651 m)  Wt 197 lb 6.4 oz (89.54 kg)  BMI 32.85 kg/m2  SpO2 98%  LMP 11/22/2015   Physical Exam  Constitutional: She appears well-developed and well-nourished.  Eyes: Conjunctivae are normal.  Neck: Normal range of motion and full passive range of motion without pain. Neck supple. Muscular tenderness present. No spinous process tenderness present. No rigidity. No edema, no erythema and normal range of motion present.    Tenderness with palpation right trapezius. Mild lordosis of cervical spine.  Cardiovascular: Normal rate, regular rhythm, normal heart sounds and normal pulses.   Pulmonary/Chest: Effort normal and breath sounds normal. She has no wheezes. She has no rhonchi. She has no rales.  Neurological: She is alert.  Skin: Skin is warm and dry.  Psychiatric: She has a normal mood and affect. Her speech is normal and behavior is normal. Thought content normal.  Vitals reviewed.      Assessment & Plan:   1. Neck pain Working diagnosis of muscle spasm. Patient and I agreed to treat with trial of muscle relaxant at bedtime and exercise. We decided to get cervical spine x-rays due to suspected lordosis  of cervical spine seen on physical exam, likely exacerbated by large breasts.  -Cervical spine x-rays - cyclobenzaprine (FLEXERIL) 10 MG tablet; Take 1 tablet (10 mg total) by mouth at bedtime as needed for muscle spasms.  Dispense: 14 tablet; Refill: 0 - [facility] Cervical Spine Complete; Future   I am having Ms. Morre maintain her busPIRone, ORTHO TRI-CYCLEN (28), albuterol, and azithromycin.   No orders of the defined types were placed in this encounter.     Start medications as prescribed and explained to patient on After Visit Summary ( AVS). Risks, benefits,  and alternatives of the medications and treatment plan prescribed today were discussed, and patient expressed understanding.   Education regarding symptom management and diagnosis given to patient.   Follow-up:Plan follow-up as discussed or as needed if any worsening symptoms or change in condition. No Follow-up on file. Follow-up with PCP for discussion of breast reduction.  Continue to follow with Myrlene Broker, MD for routine health maintenance.   Carolyn How and I agreed with plan.   Rennie Plowman, FNP

## 2015-11-29 NOTE — Patient Instructions (Addendum)
Schedule appt with PCP for discussion of breast reduction.   If there is no improvement in your symptoms, or if there is any worsening of symptoms, or if you have any additional concerns, please return for re-evaluation; or, if we are closed, consider going to the Emergency Room for evaluation if symptoms urgent.  Muscle Cramps and Spasms Muscle cramps and spasms occur when a muscle or muscles tighten and you have no control over this tightening (involuntary muscle contraction). They are a common problem and can develop in any muscle. The most common place is in the calf muscles of the leg. Both muscle cramps and muscle spasms are involuntary muscle contractions, but they also have differences:   Muscle cramps are sporadic and painful. They may last a few seconds to a quarter of an hour. Muscle cramps are often more forceful and last longer than muscle spasms.  Muscle spasms may or may not be painful. They may also last just a few seconds or much longer. CAUSES  It is uncommon for cramps or spasms to be due to a serious underlying problem. In many cases, the cause of cramps or spasms is unknown. Some common causes are:   Overexertion.   Overuse from repetitive motions (doing the same thing over and over).   Remaining in a certain position for a long period of time.   Improper preparation, form, or technique while performing a sport or activity.   Dehydration.   Injury.   Side effects of some medicines.   Abnormally low levels of the salts and ions in your blood (electrolytes), especially potassium and calcium. This could happen if you are taking water pills (diuretics) or you are pregnant.  Some underlying medical problems can make it more likely to develop cramps or spasms. These include, but are not limited to:   Diabetes.   Parkinson disease.   Hormone disorders, such as thyroid problems.   Alcohol abuse.   Diseases specific to muscles, joints, and bones.    Blood vessel disease where not enough blood is getting to the muscles.  HOME CARE INSTRUCTIONS   Stay well hydrated. Drink enough water and fluids to keep your urine clear or pale yellow.  It may be helpful to massage, stretch, and relax the affected muscle.  For tight or tense muscles, use a warm towel, heating pad, or hot shower water directed to the affected area.  If you are sore or have pain after a cramp or spasm, applying ice to the affected area may relieve discomfort.  Put ice in a plastic bag.  Place a towel between your skin and the bag.  Leave the ice on for 15-20 minutes, 03-04 times a day.  Medicines used to treat a known cause of cramps or spasms may help reduce their frequency or severity. Only take over-the-counter or prescription medicines as directed by your caregiver. SEEK MEDICAL CARE IF:  Your cramps or spasms get more severe, more frequent, or do not improve over time.  MAKE SURE YOU:   Understand these instructions.  Will watch your condition.  Will get help right away if you are not doing well or get worse.   This information is not intended to replace advice given to you by your health care provider. Make sure you discuss any questions you have with your health care provider.   Document Released: 01/03/2002 Document Revised: 11/08/2012 Document Reviewed: 06/30/2012 Elsevier Interactive Patient Education 2016 [employer] Cervical Strain and Sprain With Rehab Cervical strain and sprain  are injuries that commonly occur with "whiplash" injuries. Whiplash occurs when the neck is forcefully whipped backward or forward, such as during a motor vehicle accident or during contact sports. The muscles, ligaments, tendons, discs, and nerves of the neck are susceptible to injury when this occurs. RISK FACTORS Risk of having a whiplash injury increases if:  Osteoarthritis of the spine.  Situations that make head or neck accidents or trauma more  likely.  High-risk sports (football, rugby, wrestling, hockey, auto racing, gymnastics, diving, contact karate, or boxing).  Poor strength and flexibility of the neck.  Previous neck injury.  Poor tackling technique.  Improperly fitted or padded equipment. SYMPTOMS   Pain or stiffness in the front or back of neck or both.  Symptoms may present immediately or up to 24 hours after injury.  Dizziness, headache, nausea, and vomiting.  Muscle spasm with soreness and stiffness in the neck.  Tenderness and swelling at the injury site. PREVENTION  Learn and use proper technique (avoid tackling with the head, spearing, and head-butting; use proper falling techniques to avoid landing on the head).  Warm up and stretch properly before activity.  Maintain physical fitness:  Strength, flexibility, and endurance.  Cardiovascular fitness.  Wear properly fitted and padded protective equipment, such as padded soft collars, for participation in contact sports. PROGNOSIS  Recovery from cervical strain and sprain injuries is dependent on the extent of the injury. These injuries are usually curable in 1 week to 3 months with appropriate treatment.  RELATED COMPLICATIONS   Temporary numbness and weakness may occur if the nerve roots are damaged, and this may persist until the nerve has completely healed.  Chronic pain due to frequent recurrence of symptoms.  Prolonged healing, especially if activity is resumed too soon (before complete recovery). TREATMENT  Treatment initially involves the use of ice and medication to help reduce pain and inflammation. It is also important to perform strengthening and stretching exercises and modify activities that worsen symptoms so the injury does not get worse. These exercises may be performed at home or with a therapist. For patients who experience severe symptoms, a soft, padded collar may be recommended to be worn around the neck.  Improving your  posture may help reduce symptoms. Posture improvement includes pulling your chin and abdomen in while sitting or standing. If you are sitting, sit in a firm chair with your buttocks against the back of the chair. While sleeping, try replacing your pillow with a small towel rolled to 2 inches in diameter, or use a cervical pillow or soft cervical collar. Poor sleeping positions delay healing.  For patients with nerve root damage, which causes numbness or weakness, the use of a cervical traction apparatus may be recommended. Surgery is rarely necessary for these injuries. However, cervical strain and sprains that are present at birth (congenital) may require surgery. MEDICATION   If pain medication is necessary, nonsteroidal anti-inflammatory medications, such as aspirin and ibuprofen, or other minor pain relievers, such as acetaminophen, are often recommended.  Do not take pain medication for 7 days before surgery.  Prescription pain relievers may be given if deemed necessary by your caregiver. Use only as directed and only as much as you need. HEAT AND COLD:   Cold treatment (icing) relieves pain and reduces inflammation. Cold treatment should be applied for 10 to 15 minutes every 2 to 3 hours for inflammation and pain and immediately after any activity that aggravates your symptoms. Use ice packs or an ice massage.  Heat treatment may be used prior to performing the stretching and strengthening activities prescribed by your caregiver, physical therapist, or athletic trainer. Use a heat pack or a warm soak. SEEK MEDICAL CARE IF:   Symptoms get worse or do not improve in 2 weeks despite treatment.  New, unexplained symptoms develop (drugs used in treatment may produce side effects). EXERCISES RANGE OF MOTION (ROM) AND STRETCHING EXERCISES - Cervical Strain and Sprain These exercises may help you when beginning to rehabilitate your injury. In order to successfully resolve your symptoms, you must  improve your posture. These exercises are designed to help reduce the forward-head and rounded-shoulder posture which contributes to this condition. Your symptoms may resolve with or without further involvement from your physician, physical therapist or athletic trainer. While completing these exercises, remember:   Restoring tissue flexibility helps normal motion to return to the joints. This allows healthier, less painful movement and activity.  An effective stretch should be held for at least 20 seconds, although you may need to begin with shorter hold times for comfort.  A stretch should never be painful. You should only feel a gentle lengthening or release in the stretched tissue. STRETCH- Axial Extensors  Lie on your back on the floor. You may bend your knees for comfort. Place a rolled-up hand towel or dish towel, about 2 inches in diameter, under the part of your head that makes contact with the floor.  Gently tuck your chin, as if trying to make a "double chin," until you feel a gentle stretch at the base of your head.  Hold __________ seconds. Repeat __________ times. Complete this exercise __________ times per day.  STRETCH - Axial Extension   Stand or sit on a firm surface. Assume a good posture: chest up, shoulders drawn back, abdominal muscles slightly tense, knees unlocked (if standing) and feet hip width apart.  Slowly retract your chin so your head slides back and your chin slightly lowers. Continue to look straight ahead.  You should feel a gentle stretch in the back of your head. Be certain not to feel an aggressive stretch since this can cause headaches later.  Hold for __________ seconds. Repeat __________ times. Complete this exercise __________ times per day. STRETCH - Cervical Side Bend   Stand or sit on a firm surface. Assume a good posture: chest up, shoulders drawn back, abdominal muscles slightly tense, knees unlocked (if standing) and feet hip width  apart.  Without letting your nose or shoulders move, slowly tip your right / left ear to your shoulder until your feel a gentle stretch in the muscles on the opposite side of your neck.  Hold __________ seconds. Repeat __________ times. Complete this exercise __________ times per day. STRETCH - Cervical Rotators   Stand or sit on a firm surface. Assume a good posture: chest up, shoulders drawn back, abdominal muscles slightly tense, knees unlocked (if standing) and feet hip width apart.  Keeping your eyes level with the ground, slowly turn your head until you feel a gentle stretch along the back and opposite side of your neck.  Hold __________ seconds. Repeat __________ times. Complete this exercise __________ times per day. RANGE OF MOTION - Neck Circles   Stand or sit on a firm surface. Assume a good posture: chest up, shoulders drawn back, abdominal muscles slightly tense, knees unlocked (if standing) and feet hip width apart.  Gently roll your head down and around from the back of one shoulder to the back of the other.  The motion should never be forced or painful.  Repeat the motion 10-20 times, or until you feel the neck muscles relax and loosen. Repeat __________ times. Complete the exercise __________ times per day. STRENGTHENING EXERCISES - Cervical Strain and Sprain These exercises may help you when beginning to rehabilitate your injury. They may resolve your symptoms with or without further involvement from your physician, physical therapist, or athletic trainer. While completing these exercises, remember:   Muscles can gain both the endurance and the strength needed for everyday activities through controlled exercises.  Complete these exercises as instructed by your physician, physical therapist, or athletic trainer. Progress the resistance and repetitions only as guided.  You may experience muscle soreness or fatigue, but the pain or discomfort you are trying to eliminate  should never worsen during these exercises. If this pain does worsen, stop and make certain you are following the directions exactly. If the pain is still present after adjustments, discontinue the exercise until you can discuss the trouble with your clinician. STRENGTH - Cervical Flexors, Isometric  Face a wall, standing about 6 inches away. Place a small pillow, a ball about 6-8 inches in diameter, or a folded towel between your forehead and the wall.  Slightly tuck your chin and gently push your forehead into the soft object. Push only with mild to moderate intensity, building up tension gradually. Keep your jaw and forehead relaxed.  Hold 10 to 20 seconds. Keep your breathing relaxed.  Release the tension slowly. Relax your neck muscles completely before you start the next repetition. Repeat __________ times. Complete this exercise __________ times per day. STRENGTH- Cervical Lateral Flexors, Isometric   Stand about 6 inches away from a wall. Place a small pillow, a ball about 6-8 inches in diameter, or a folded towel between the side of your head and the wall.  Slightly tuck your chin and gently tilt your head into the soft object. Push only with mild to moderate intensity, building up tension gradually. Keep your jaw and forehead relaxed.  Hold 10 to 20 seconds. Keep your breathing relaxed.  Release the tension slowly. Relax your neck muscles completely before you start the next repetition. Repeat __________ times. Complete this exercise __________ times per day. STRENGTH - Cervical Extensors, Isometric   Stand about 6 inches away from a wall. Place a small pillow, a ball about 6-8 inches in diameter, or a folded towel between the back of your head and the wall.  Slightly tuck your chin and gently tilt your head back into the soft object. Push only with mild to moderate intensity, building up tension gradually. Keep your jaw and forehead relaxed.  Hold 10 to 20 seconds. Keep your  breathing relaxed.  Release the tension slowly. Relax your neck muscles completely before you start the next repetition. Repeat __________ times. Complete this exercise __________ times per day. POSTURE AND BODY MECHANICS CONSIDERATIONS - Cervical Strain and Sprain Keeping correct posture when sitting, standing or completing your activities will reduce the stress put on different body tissues, allowing injured tissues a chance to heal and limiting painful experiences. The following are general guidelines for improved posture. Your physician or physical therapist will provide you with any instructions specific to your needs. While reading these guidelines, remember:  The exercises prescribed by your provider will help you have the flexibility and strength to maintain correct postures.  The correct posture provides the optimal environment for your joints to work. All of your joints have less wear and tear  when properly supported by a spine with good posture. This means you will experience a healthier, less painful body.  Correct posture must be practiced with all of your activities, especially prolonged sitting and standing. Correct posture is as important when doing repetitive low-stress activities (typing) as it is when doing a single heavy-load activity (lifting). PROLONGED STANDING WHILE SLIGHTLY LEANING FORWARD When completing a task that requires you to lean forward while standing in one place for a long time, place either foot up on a stationary 2- to 4-inch high object to help maintain the best posture. When both feet are on the ground, the low back tends to lose its slight inward curve. If this curve flattens (or becomes too large), then the back and your other joints will experience too much stress, fatigue more quickly, and can cause pain.  RESTING POSITIONS Consider which positions are most painful for you when choosing a resting position. If you have pain with flexion-based activities  (sitting, bending, stooping, squatting), choose a position that allows you to rest in a less flexed posture. You would want to avoid curling into a fetal position on your side. If your pain worsens with extension-based activities (prolonged standing, working overhead), avoid resting in an extended position such as sleeping on your stomach. Most people will find more comfort when they rest with their spine in a more neutral position, neither too rounded nor too arched. Lying on a non-sagging bed on your side with a pillow between your knees, or on your back with a pillow under your knees will often provide some relief. Keep in mind, being in any one position for a prolonged period of time, no matter how correct your posture, can still lead to stiffness. WALKING Walk with an upright posture. Your ears, shoulders, and hips should all line up. OFFICE WORK When working at a desk, create an environment that supports good, upright posture. Without extra support, muscles fatigue and lead to excessive strain on joints and other tissues. CHAIR:  A chair should be able to slide under your desk when your back makes contact with the back of the chair. This allows you to work closely.  The chair's height should allow your eyes to be level with the upper part of your monitor and your hands to be slightly lower than your elbows.  Body position:  Your feet should make contact with the floor. If this is not possible, use a foot rest.  Keep your ears over your shoulders. This will reduce stress on your neck and low back.   This information is not intended to replace advice given to you by your health care provider. Make sure you discuss any questions you have with your health care provider.   Document Released: 07/14/2005 Document Revised: 08/04/2014 Document Reviewed: 10/26/2008 Elsevier Interactive Patient Education [employer]

## 2015-12-27 ENCOUNTER — Encounter: Payer: Self-pay | Admitting: Internal Medicine

## 2015-12-27 ENCOUNTER — Ambulatory Visit (INDEPENDENT_AMBULATORY_CARE_PROVIDER_SITE_OTHER): Payer: BLUE CROSS/BLUE SHIELD | Admitting: Internal Medicine

## 2015-12-27 VITALS — BP 112/60 | HR 88 | Temp 98.4°F | Resp 12 | Ht 65.0 in | Wt 199.0 lb

## 2015-12-27 DIAGNOSIS — M542 Cervicalgia: Secondary | ICD-10-CM

## 2015-12-27 NOTE — Patient Instructions (Signed)
The two good plastic surgeons in town are: You can call either one or both to set up a consultation.   Teena Iraniavid M. Odis LusterBowers M.D.  5.0 (2)  Plastic Surgeon  [address]  [phone]  Dr. Maxie BarbHarrill C. Izora Ribasoley, MD  5.0 (4)  Plastic Surgeon  [address]  [phone]

## 2015-12-27 NOTE — Progress Notes (Signed)
Pre visit review using our clinic review tool, if applicable. No additional management support is needed unless otherwise documented below in the visit note. 

## 2015-12-27 NOTE — Assessment & Plan Note (Signed)
Resultant from her breasts. Given information about breast reduction surgery for her consideration as well as information about some plastic surgeons and informed that she can schedule consultation with them to proceed. If needed we can provide a letter of medical necessity.

## 2015-12-27 NOTE — Progress Notes (Signed)
   Subjective:    Patient ID: Carolyn Miranda, female    DOB: 1991-02-14, 25 y.o.   MRN: [id number]  HPI The patient is a 25 YO female coming in for consult about breast reduction surgery. She is having some neck pain and she thinks that her posture is worse because of it. She is not able to wear normal clothes and this is a QOL issue for her. She has lost some weight in the last couple of years which has not helped with her breast size.   Review of Systems  Constitutional: Negative for fever, activity change and fatigue.  Respiratory: Negative for cough, chest tightness, shortness of breath and wheezing.   Cardiovascular: Negative for chest pain, palpitations and leg swelling.  Gastrointestinal: Negative for nausea, abdominal pain, diarrhea, constipation and abdominal distention.  Musculoskeletal: Positive for myalgias, back pain and neck pain.  Skin: Negative.   Neurological: Negative.   Psychiatric/Behavioral: Positive for dysphoric mood. Negative for suicidal ideas, behavioral problems, self-injury and decreased concentration. The patient is nervous/anxious.       Objective:   Physical Exam  Constitutional: She is oriented to person, place, and time. She appears well-developed and well-nourished.  Overweight  HENT:  Head: Normocephalic and atraumatic.  Eyes: EOM are normal.  Neck: Normal range of motion.  Cardiovascular: Normal rate and regular rhythm.   Pulmonary/Chest: Effort normal. No respiratory distress. She has no wheezes. She has no rales.  Abdominal: Soft. She exhibits no distension. There is no tenderness. There is no rebound.  Musculoskeletal: She exhibits tenderness. She exhibits no edema.  Tenderness in the neck and upper back muscles  Neurological: She is alert and oriented to person, place, and time. Coordination normal.  Skin: Skin is warm and dry.  Psychiatric: She has a normal mood and affect.   Filed Vitals:   12/27/15 1536  BP: 112/60  Pulse: 88  Temp:  98.4 F (36.9 C)  TempSrc: Oral  Resp: 12  Height: 5\' 5"  (1.651 m)  Weight: 199 lb (90.266 kg)  SpO2: 98%      Assessment & Plan:

## 2016-02-03 ENCOUNTER — Other Ambulatory Visit: Payer: Self-pay | Admitting: Internal Medicine

## 2016-03-20 ENCOUNTER — Ambulatory Visit (INDEPENDENT_AMBULATORY_CARE_PROVIDER_SITE_OTHER): Payer: BLUE CROSS/BLUE SHIELD | Admitting: Internal Medicine

## 2016-03-20 ENCOUNTER — Encounter: Payer: Self-pay | Admitting: Internal Medicine

## 2016-03-20 ENCOUNTER — Other Ambulatory Visit: Payer: BLUE CROSS/BLUE SHIELD

## 2016-03-20 VITALS — BP 124/86 | HR 78 | Temp 98.3°F | Resp 16 | Ht 65.0 in | Wt 205.0 lb

## 2016-03-20 DIAGNOSIS — Z113 Encounter for screening for infections with a predominantly sexual mode of transmission: Secondary | ICD-10-CM

## 2016-03-20 DIAGNOSIS — N9089 Other specified noninflammatory disorders of vulva and perineum: Secondary | ICD-10-CM

## 2016-03-20 MED ORDER — HYDROCORTISONE 2 % EX LOTN: TOPICAL_LOTION | CUTANEOUS | 0 refills | Status: DC

## 2016-03-20 NOTE — Progress Notes (Signed)
Pre visit review using our clinic review tool, if applicable. No additional management support is needed unless otherwise documented below in the visit note. 

## 2016-03-20 NOTE — Progress Notes (Signed)
   Subjective:    Patient ID: Carolyn Miranda, female    DOB: Dec 13, 1990, 25 y.o.   MRN: [id number]  HPI The patient is a 25 YO female coming in for irritation and discharge around her labia. Started about 1 week ago after new partner experience without condom. She has been having redness on her labia. She has been bathing and not as much discharge. Still pain and irritation of the labia since that time. The discharge was clear and no odor to it with some greenish color at times.   Review of Systems  Constitutional: Negative.   Respiratory: Negative.   Cardiovascular: Negative.   Gastrointestinal: Negative.   Genitourinary: Positive for vaginal discharge and vaginal pain. Negative for difficulty urinating, dyspareunia, frequency, genital sores, pelvic pain and urgency.      Objective:   Physical Exam  Constitutional: She appears well-developed and well-nourished.  HENT:  Head: Normocephalic and atraumatic.  Eyes: EOM are normal.  Cardiovascular: Normal rate and regular rhythm.   Pulmonary/Chest: Effort normal and breath sounds normal.  Abdominal: Soft. She exhibits no distension. There is no tenderness. There is no rebound.  Genitourinary: Vagina normal.  Genitourinary Comments: Left labia with some swelling and redness, no discharge externally or in the vagina on exam. No LAD inguinal region. No cervical motion tenderness.   Skin: Skin is warm and dry.   Vitals:   03/20/16 0838  BP: 124/86  Pulse: 78  Resp: 16  Temp: 98.3 F (36.8 C)  TempSrc: Oral  SpO2: 98%  Weight: 205 lb (93 kg)  Height: 5\' 5"  (1.651 m)      Assessment & Plan:

## 2016-03-20 NOTE — Patient Instructions (Addendum)
We have sent in a lotion to use on the affected areas. Apply twice a day and this should be better within a week. If not better call us back.   We are checking the urine for STD screening. The best way to avoid risk of STDs is by using condoms every time.   You should still get in with a gynecologist for routine care. You can contact any that you are comfortable with.

## 2016-03-20 NOTE — Assessment & Plan Note (Signed)
Checking GC/chlamydia screening. Reminded to use protection with each sexual encounter. Rx for hydrocortisone lotion for the irritation of the labia. No discharge on exam of the external genitalia or vaginal area today.

## 2016-03-21 ENCOUNTER — Other Ambulatory Visit: Payer: Self-pay | Admitting: Internal Medicine

## 2016-03-21 ENCOUNTER — Other Ambulatory Visit: Payer: BLUE CROSS/BLUE SHIELD

## 2016-03-21 ENCOUNTER — Ambulatory Visit (INDEPENDENT_AMBULATORY_CARE_PROVIDER_SITE_OTHER): Payer: BLUE CROSS/BLUE SHIELD

## 2016-03-21 DIAGNOSIS — A549 Gonococcal infection, unspecified: Secondary | ICD-10-CM

## 2016-03-21 DIAGNOSIS — Z113 Encounter for screening for infections with a predominantly sexual mode of transmission: Secondary | ICD-10-CM

## 2016-03-21 LAB — GC/CHLAMYDIA PROBE AMP
CT PROBE, AMP APTIMA: NOT DETECTED
GC Probe RNA: DETECTED — AB

## 2016-03-21 MED ORDER — CEFTRIAXONE SODIUM 250 MG IJ SOLR
250.0000 mg | Freq: Once | INTRAMUSCULAR | Status: AC
  Administered 2016-03-21: 250 mg via INTRAMUSCULAR

## 2016-03-21 MED ORDER — AZITHROMYCIN 500 MG PO TABS: 1000.0000 mg | ORAL_TABLET | Freq: Once | ORAL | 0 refills | Status: AC

## 2016-03-21 NOTE — Progress Notes (Signed)
Medical treatment/procedure(s) were performed by non-physician practitioner and as supervising physician I was immediately available for consultation/collaboration. I agree with above. Elizabeth A Crawford, MD  

## 2016-03-22 LAB — HIV ANTIBODY (ROUTINE TESTING W REFLEX): HIV 1&2 Ab, 4th Generation: NONREACTIVE

## 2016-04-18 ENCOUNTER — Ambulatory Visit (INDEPENDENT_AMBULATORY_CARE_PROVIDER_SITE_OTHER): Payer: BLUE CROSS/BLUE SHIELD | Admitting: Internal Medicine

## 2016-04-18 ENCOUNTER — Encounter: Payer: Self-pay | Admitting: Internal Medicine

## 2016-04-18 DIAGNOSIS — F4323 Adjustment disorder with mixed anxiety and depressed mood: Secondary | ICD-10-CM

## 2016-04-18 DIAGNOSIS — J4520 Mild intermittent asthma, uncomplicated: Secondary | ICD-10-CM | POA: Diagnosis not present

## 2016-04-18 MED ORDER — ALBUTEROL SULFATE HFA 108 (90 BASE) MCG/ACT IN AERS: 2.0000 | INHALATION_SPRAY | Freq: Four times a day (QID) | RESPIRATORY_TRACT | 1 refills | Status: DC | PRN

## 2016-04-18 MED ORDER — CITALOPRAM HYDROBROMIDE 20 MG PO TABS: 20.0000 mg | ORAL_TABLET | Freq: Every day | ORAL | 0 refills | Status: DC

## 2016-04-18 NOTE — Progress Notes (Signed)
   Subjective:    Patient ID: Carolyn Miranda, female    DOB: January 10, 1991, 25 y.o.   MRN: [id number]  HPI The patient is a 25 YO female coming in for follow up of her asthma and adjustment disorder. She is using buspar for the anxiety and failed trial of wellbutrin. She did not want to try another agent. She was going to see her counselor again but has not done that. She is now having severe symptoms with that and daily problems with getting out of bed. No motivation to go to her classes or do her work. She cannot hold down a stable job. She has had problems with this since childhood and has had thoughts of suicide in the past but not tried. She is not thinking about that much right now. No SI/HI.  She is using an albuterol inhaler prn for the asthma. Denies night time symptoms. Uses a couple times per week. Allergy season is worse for her. She gets out of breath with walking short distances. She uses the albuterol inhaler but it does not always seem to help. Not having much   Review of Systems  Constitutional: Negative for activity change, fatigue and fever.  HENT: Negative.   Eyes: Negative.   Respiratory: Positive for shortness of breath and wheezing. Negative for cough and chest tightness.   Cardiovascular: Negative for chest pain, palpitations and leg swelling.  Gastrointestinal: Negative for abdominal distention, abdominal pain, constipation, diarrhea and nausea.  Musculoskeletal: Negative.   Skin: Negative.   Neurological: Negative.   Psychiatric/Behavioral: Positive for behavioral problems, decreased concentration and dysphoric mood. Negative for self-injury and suicidal ideas. The patient is nervous/anxious.       Objective:   Physical Exam  Constitutional: She appears well-developed and well-nourished.  HENT:  Head: Normocephalic and atraumatic.  Eyes: EOM are normal.  Cardiovascular: Normal rate and regular rhythm.   Pulmonary/Chest: Effort normal and breath sounds normal.    Abdominal: Soft. She exhibits no distension. There is no tenderness. There is no rebound.  Musculoskeletal: She exhibits no edema.  Neurological: Coordination normal.  Skin: Skin is warm and dry.  Psychiatric:  Distraught during the visit   Vitals:   04/18/16 1305  BP: 110/78  Pulse: 78  Resp: 16  Temp: 97.9 F (36.6 C)  TempSrc: Oral  SpO2: 98%  Weight: 204 lb 1.9 oz (92.6 kg)  Height: 5\' 5"  (1.651 m)      Assessment & Plan:

## 2016-04-18 NOTE — Assessment & Plan Note (Addendum)
This is severe and she does struggle with anxiety and depression. She is going to start seeing a Veterinary surgeoncounselor. We will start celexa for her symptoms and continue the buspar.

## 2016-04-18 NOTE — Progress Notes (Signed)
Pre visit review using our clinic review tool, if applicable. No additional management support is needed unless otherwise documented below in the visit note. 

## 2016-04-18 NOTE — Patient Instructions (Signed)
We have given you a sample of the breo inhaler which is what you will use every day.   Use 1 inhalation once a day with the breo.   We have sent in the refill of the albuterol to use as needed.   We have sent in celexa to start. Take 1 pill daily for 2 weeks, if you are doing well on that and are still having symptoms increase to 2 pills a day. It can take 3-4 weeks to get to full benefit.

## 2016-04-19 NOTE — Assessment & Plan Note (Signed)
Given sample of breo and refill of the albuterol. She will call back with how the breo is working.

## 2016-04-24 ENCOUNTER — Ambulatory Visit (INDEPENDENT_AMBULATORY_CARE_PROVIDER_SITE_OTHER): Payer: BLUE CROSS/BLUE SHIELD | Admitting: Internal Medicine

## 2016-04-24 ENCOUNTER — Encounter: Payer: Self-pay | Admitting: Internal Medicine

## 2016-04-24 DIAGNOSIS — F4323 Adjustment disorder with mixed anxiety and depressed mood: Secondary | ICD-10-CM | POA: Diagnosis not present

## 2016-04-24 MED ORDER — BUSPIRONE HCL 10 MG PO TABS: 10.0000 mg | ORAL_TABLET | Freq: Two times a day (BID) | ORAL | 6 refills | Status: DC | PRN

## 2016-04-24 NOTE — Progress Notes (Signed)
   Subjective:    Patient ID: Carolyn Miranda, female    DOB: 1991-04-22, 25 y.o.   MRN: [id number]  HPI The patient is a 25 YO female coming in for side effect from celexa. 1 hour after first dose she started feeling bad. She stopped taking it and has not had any since. She felt bad for several days after taking it. Wants to try something else for her depression but is scared about side effects. Has taken buspar in the past with good success for sleep and some anxiety relief. Denies SI/HI.   Review of Systems  Constitutional: Negative for activity change, fatigue and fever.  Respiratory: Negative for cough, chest tightness, shortness of breath and wheezing.   Cardiovascular: Negative for chest pain, palpitations and leg swelling.  Gastrointestinal: Positive for diarrhea. Negative for abdominal distention, abdominal pain, constipation and nausea.  Musculoskeletal: Negative.   Skin: Negative.   Neurological: Negative.   Psychiatric/Behavioral: Positive for behavioral problems, decreased concentration and dysphoric mood. Negative for self-injury and suicidal ideas. The patient is nervous/anxious.       Objective:   Physical Exam  Constitutional: She appears well-developed and well-nourished.  HENT:  Head: Normocephalic and atraumatic.  Eyes: EOM are normal.  Cardiovascular: Normal rate and regular rhythm.   Pulmonary/Chest: Effort normal and breath sounds normal.  Abdominal: Soft. She exhibits no distension. There is no tenderness. There is no rebound.  Musculoskeletal: She exhibits no edema.  Neurological: Coordination normal.  Skin: Skin is warm and dry.  Psychiatric:  Distraught during the visit   Vitals:   04/24/16 1405  BP: 114/70  Pulse: (!) 108  Resp: 16  Temp: 98.3 F (36.8 C)  TempSrc: Oral  SpO2: 98%  Weight: 201 lb (91.2 kg)  Height: 5\' 4"  (1.626 m)      Assessment & Plan:

## 2016-04-24 NOTE — Patient Instructions (Signed)
We have sent in the buspar 10 mg that you can start taking in the evening. Once you are on this for about 1 week you can start taking it twice a day to see if this helps level you out more.

## 2016-04-24 NOTE — Assessment & Plan Note (Signed)
Side effect from celexa, she would like to try something else. Increase buspar to 10 mg BID and she needs to see her counselor. She has not since last visit.

## 2016-04-24 NOTE — Progress Notes (Signed)
Pre visit review using our clinic review tool, if applicable. No additional management support is needed unless otherwise documented below in the visit note. 

## 2016-05-15 ENCOUNTER — Other Ambulatory Visit: Payer: Self-pay | Admitting: Internal Medicine

## 2017-01-13 ENCOUNTER — Other Ambulatory Visit: Payer: Self-pay | Admitting: Internal Medicine

## 2017-01-13 NOTE — Telephone Encounter (Signed)
Will need follow up - 3 months sent.

## 2017-01-13 NOTE — Telephone Encounter (Signed)
Routing to greg---last OV addressing contraception was jan/2017---please advise thanks

## 2017-01-13 NOTE — Telephone Encounter (Signed)
Left message advising patient of gregs note/instructions

## 2017-04-02 ENCOUNTER — Other Ambulatory Visit: Payer: BLUE CROSS/BLUE SHIELD

## 2017-04-02 ENCOUNTER — Encounter: Payer: Self-pay | Admitting: Internal Medicine

## 2017-04-02 ENCOUNTER — Ambulatory Visit (INDEPENDENT_AMBULATORY_CARE_PROVIDER_SITE_OTHER): Payer: BLUE CROSS/BLUE SHIELD | Admitting: Internal Medicine

## 2017-04-02 VITALS — BP 124/78 | HR 90 | Temp 98.0°F | Ht 64.0 in | Wt 213.0 lb

## 2017-04-02 DIAGNOSIS — Z202 Contact with and (suspected) exposure to infections with a predominantly sexual mode of transmission: Secondary | ICD-10-CM

## 2017-04-02 NOTE — Patient Instructions (Addendum)
We will check you for the STDs today and call you back with the results.   Dr. Odis LusterBowers is a plastic surgeon that does breast reduction. [phone] at [address], [phone]

## 2017-04-03 ENCOUNTER — Telehealth: Payer: Self-pay | Admitting: Internal Medicine

## 2017-04-03 LAB — GC/CHLAMYDIA PROBE AMP
CHLAMYDIA, DNA PROBE: POSITIVE — AB
Neisseria gonorrhoeae by PCR: NEGATIVE

## 2017-04-03 LAB — HIV ANTIBODY (ROUTINE TESTING W REFLEX): HIV 1&2 Ab, 4th Generation: NONREACTIVE

## 2017-04-03 LAB — PREGNANCY, URINE: Preg Test, Ur: NEGATIVE

## 2017-04-03 NOTE — Telephone Encounter (Signed)
Patient is calling again to follow up on results.

## 2017-04-03 NOTE — Telephone Encounter (Signed)
All results are not available yet, but pregnancy negative and HIV negative.

## 2017-04-03 NOTE — Telephone Encounter (Signed)
Please advise thanks.

## 2017-04-03 NOTE — Telephone Encounter (Signed)
Patient contacted and informed not all results were in. Patient stated understanding that they are just nervous and will continue to look at my chart/wait for our call

## 2017-04-03 NOTE — Telephone Encounter (Signed)
Pt called checking on the results from her visit yesterday. She would like a call with them when they are available.

## 2017-04-05 ENCOUNTER — Other Ambulatory Visit: Payer: Self-pay | Admitting: Family

## 2017-04-05 DIAGNOSIS — Z202 Contact with and (suspected) exposure to infections with a predominantly sexual mode of transmission: Secondary | ICD-10-CM | POA: Insufficient documentation

## 2017-04-05 NOTE — Progress Notes (Signed)
   Subjective:    Patient ID: Carolyn Miranda, female    DOB: Dec 20, 1990, 26 y.o.   MRN: [id number]  HPI The patient is a 26 YO female coming in for STD exposure. She is not sure if she is pregnant. She had a period last month but it was very light and that was unusual for her. She did not use protection. She is having a lot of stress right now and is having more anxiety and depression. She is seeing psychiatry and counseling but they are not working yet. Denies discharge or drainage. No fevers or chills.   Review of Systems  Constitutional: Negative.   Respiratory: Negative.   Cardiovascular: Negative.   Gastrointestinal: Negative.   Genitourinary: Negative for difficulty urinating, dyspareunia, dysuria, frequency, genital sores, hematuria and menstrual problem.  Musculoskeletal: Negative.   Neurological: Negative.       Objective:   Physical Exam  Constitutional: She is oriented to person, place, and time. She appears well-developed and well-nourished.  HENT:  Head: Normocephalic and atraumatic.  Eyes: EOM are normal.  Cardiovascular: Normal rate and regular rhythm.   Pulmonary/Chest: Effort normal and breath sounds normal.  Abdominal: Soft.  Genitourinary:  Genitourinary Comments: Declines vaginal exam  Neurological: She is alert and oriented to person, place, and time.  Skin: Skin is warm and dry.   Vitals:   04/02/17 1609  BP: 124/78  Pulse: 90  Temp: 98 F (36.7 C)  TempSrc: Oral  SpO2: 98%  Weight: 213 lb (96.6 kg)  Height: 5\' 4"  (1.626 m)      Assessment & Plan:

## 2017-04-05 NOTE — Assessment & Plan Note (Signed)
Checking GC/Chlamydia and HIV and pregnancy. Treat as appropriate. Reminded about the need for safe sex to avoid STD or pregnancy.

## 2017-04-06 ENCOUNTER — Other Ambulatory Visit: Payer: Self-pay

## 2017-04-06 ENCOUNTER — Other Ambulatory Visit: Payer: Self-pay | Admitting: Internal Medicine

## 2017-04-06 MED ORDER — NORGESTIM-ETH ESTRAD TRIPHASIC 0.18/0.215/0.25 MG-35 MCG PO TABS: 1.0000 | ORAL_TABLET | Freq: Every day | ORAL | 0 refills | Status: DC

## 2017-04-06 MED ORDER — AZITHROMYCIN 250 MG PO TABS: 1000.0000 mg | ORAL_TABLET | Freq: Once | ORAL | 0 refills | Status: AC

## 2017-04-09 ENCOUNTER — Telehealth: Payer: Self-pay | Admitting: Internal Medicine

## 2017-04-09 MED ORDER — AZITHROMYCIN 250 MG PO TABS: 1000.0000 mg | ORAL_TABLET | Freq: Once | ORAL | 0 refills | Status: AC

## 2017-04-09 NOTE — Telephone Encounter (Signed)
Has resent it in, needs to take all at once.

## 2017-04-09 NOTE — Telephone Encounter (Signed)
Pt informed of Rx sent in. Confirmed pharmacy, she will take all at once.

## 2017-04-09 NOTE — Telephone Encounter (Signed)
Pt called stating she read the directions for azithromycin (ZITHROMAX) 250 MG tablet  Wrong, instead of taking all at once she took one a day, she would like to know what she needs to do, please call back in regard

## 2017-04-12 IMAGING — DX DG CERVICAL SPINE COMPLETE 4+V
5 series · 5 of 5 positions shown · non-contrast
Comparison: None

CLINICAL DATA: Daily neck stiffness and pain with nocturnal pain at
night for the past 4 days without known injury.

EXAM:
CERVICAL SPINE - COMPLETE 4+ VIEW

[c-spine lat]
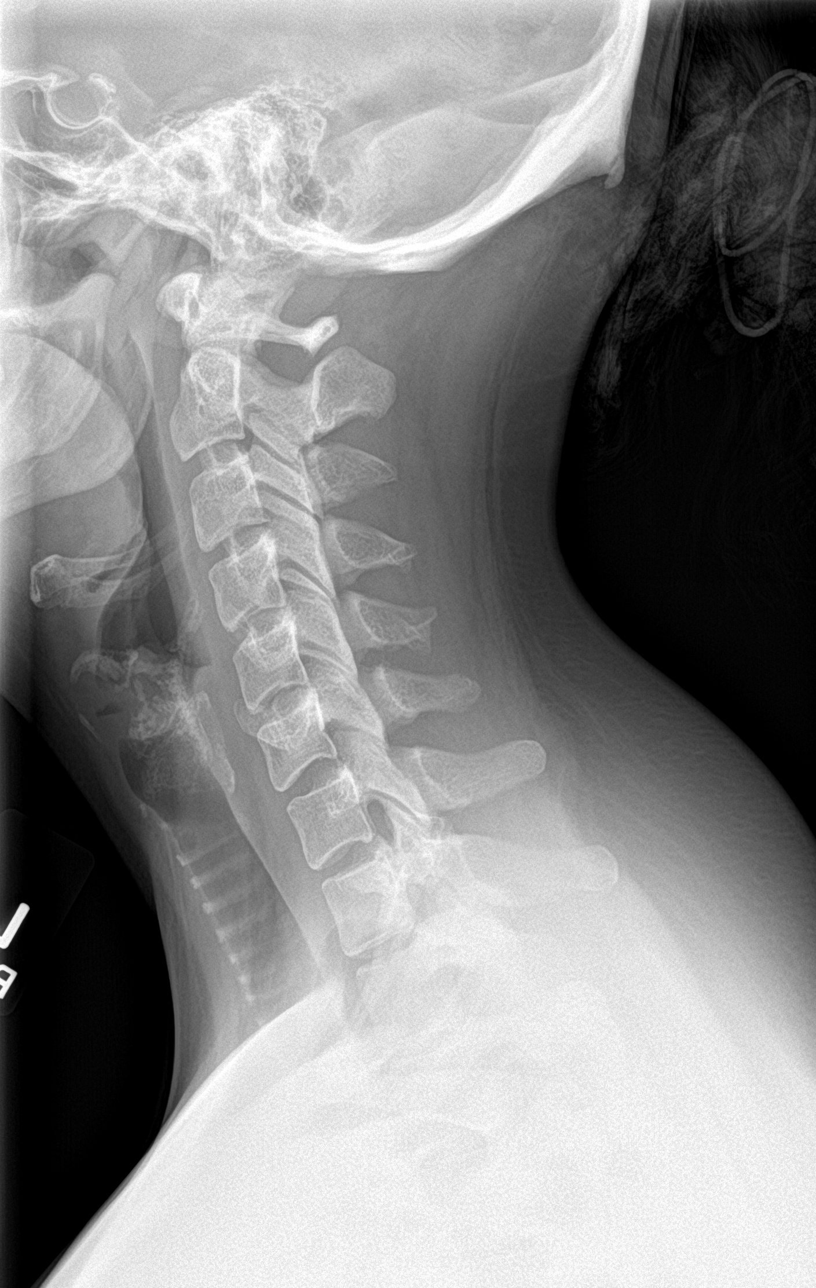

[c-spine obl (1 of 2)]
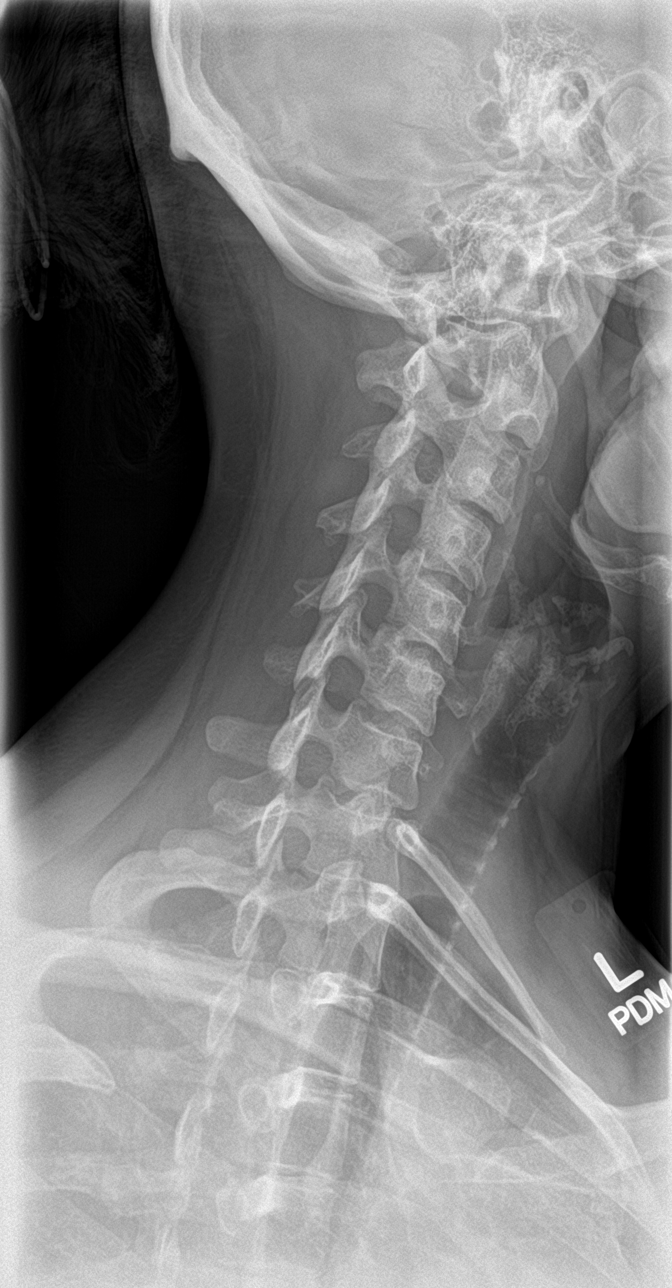

[c-spine obl (2 of 2)]
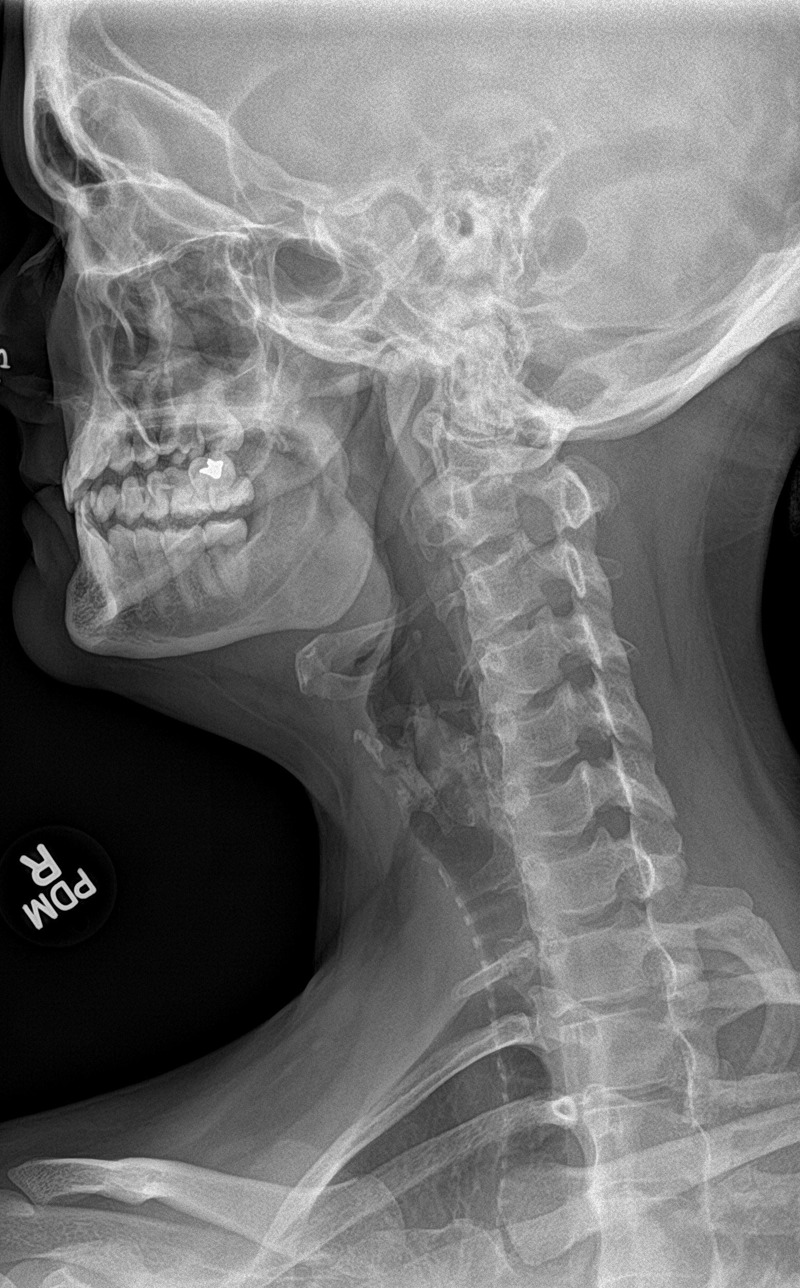

[c-spine ap]
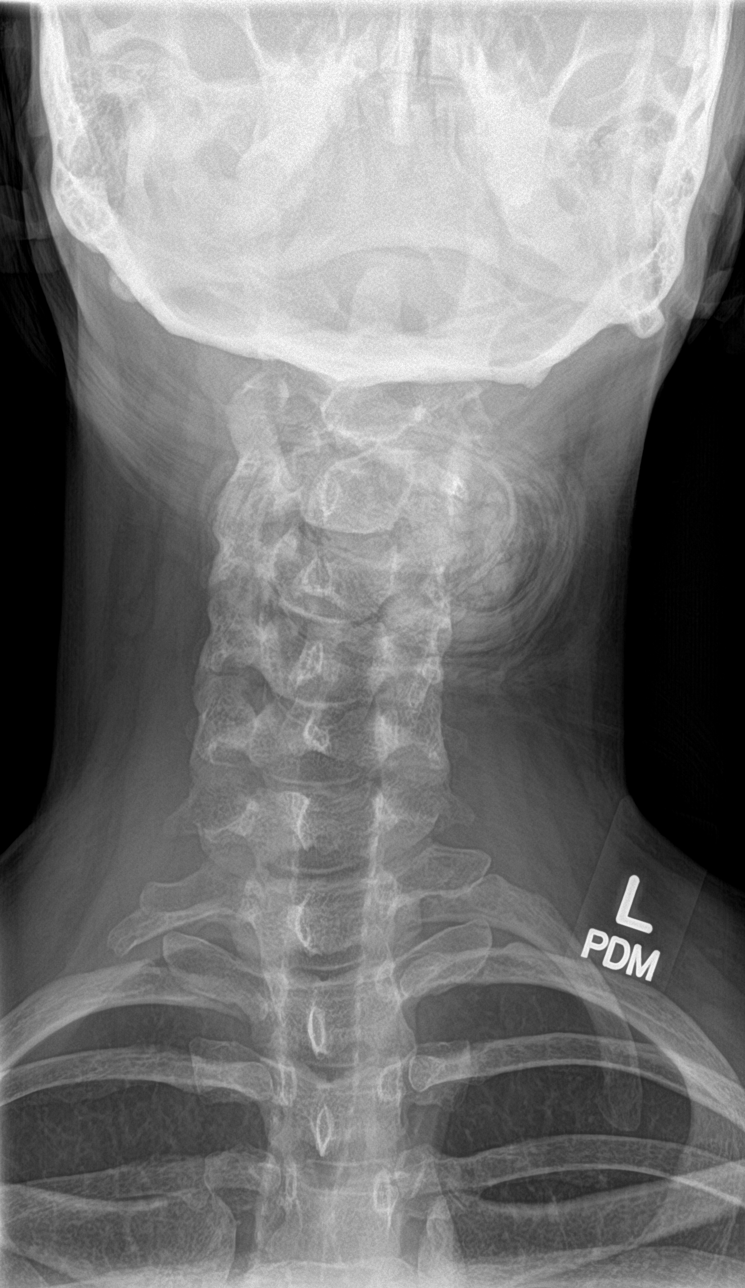

[c-spine open mouth]
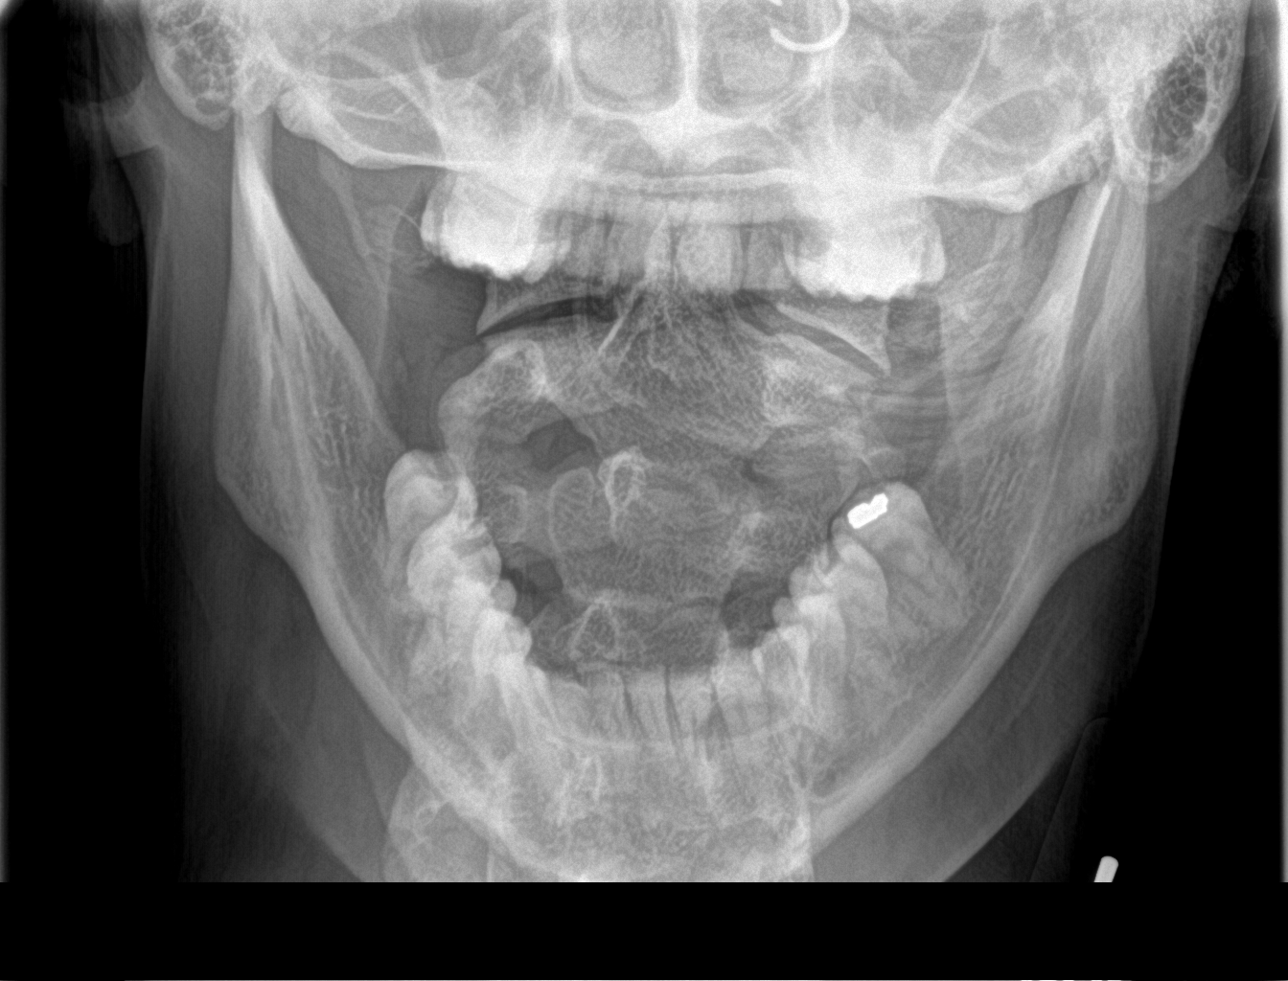

[5 of 5 positions shown; findings below may reference images not displayed]

FINDINGS: The cervical vertebral bodies are preserved in height. There is very
mild loss of the normal cervical lordosis. The disc space heights
are well maintained. There is no significant bony encroachment upon
the neural foramina. There is no perched facet. The spinous
processes and odontoid are intact. The prevertebral soft tissue
spaces are normal.
IMPRESSION: Mild loss of the normal cervical lordosis suggests muscle spasm. No
acute bony abnormality is observed.

## 2017-04-13 ENCOUNTER — Telehealth: Payer: Self-pay | Admitting: Internal Medicine

## 2017-04-13 ENCOUNTER — Telehealth: Payer: Self-pay

## 2017-04-13 NOTE — Telephone Encounter (Signed)
Health dept notified via fax of chlamydia diagnosis--fax number [phone]---attached office visit notes and labs

## 2017-04-13 NOTE — Telephone Encounter (Signed)
Is faxing over a release of information.  Would like to know if patient was treated for chlamydia by our office.

## 2017-04-13 NOTE — Telephone Encounter (Signed)
Health dept. form, office notes and lab diagnosis faxed to health dept---forms on Carolyn Miranda's desk

## 2017-05-12 NOTE — Telephone Encounter (Signed)
[facility] Department of Social Services is now requesting this information.  Will fax a release of information over.  Fax number is [phone].  Ph# [phone].

## 2017-06-05 ENCOUNTER — Ambulatory Visit: Payer: BLUE CROSS/BLUE SHIELD | Admitting: Internal Medicine

## 2017-06-05 ENCOUNTER — Encounter: Payer: Self-pay | Admitting: Internal Medicine

## 2017-06-05 DIAGNOSIS — M542 Cervicalgia: Secondary | ICD-10-CM

## 2017-06-05 MED ORDER — CYCLOBENZAPRINE HCL 5 MG PO TABS: 5.0000 mg | ORAL_TABLET | Freq: Three times a day (TID) | ORAL | 1 refills | Status: DC | PRN

## 2017-06-05 MED ORDER — NYSTATIN-TRIAMCINOLONE 100000-0.1 UNIT/GM-% EX OINT: 1.0000 "application " | TOPICAL_OINTMENT | Freq: Two times a day (BID) | CUTANEOUS | 0 refills | Status: DC

## 2017-06-05 NOTE — Progress Notes (Signed)
   Subjective:    Patient ID: Carolyn Miranda, female    DOB: June 25, 1991, 26 y.o.   MRN: [id number]  HPI The patient is a 26 YO female coming in for back and neck pain due to oversized breasts. She has seen the plastic surgeon and they are willing to do the procedure but the insurance company has not approved it. She has been taking motrin everyday for the pain with some success. She is concerned about the long term side effects of this. Denies numbness in her arms.   Review of Systems  Constitutional: Negative.   HENT: Negative.   Eyes: Negative.   Respiratory: Negative for cough, chest tightness and shortness of breath.   Cardiovascular: Negative for chest pain, palpitations and leg swelling.  Gastrointestinal: Negative for abdominal distention, abdominal pain, constipation, diarrhea, nausea and vomiting.  Musculoskeletal: Positive for arthralgias and myalgias.  Neurological: Negative.       Objective:   Physical Exam  Constitutional: She is oriented to person, place, and time. She appears well-developed and well-nourished.  HENT:  Head: Normocephalic and atraumatic.  Eyes: EOM are normal.  Neck: Normal range of motion.  Cardiovascular: Normal rate and regular rhythm.  Pulmonary/Chest: Effort normal and breath sounds normal. No respiratory distress. She has no wheezes. She has no rales.  Abdominal: Soft. Bowel sounds are normal. She exhibits no distension. There is no tenderness. There is no rebound.  Musculoskeletal: She exhibits no edema.  Neurological: She is alert and oriented to person, place, and time. Coordination normal.  Skin: Skin is warm and dry.   Vitals:   06/05/17 0954  BP: 122/80  Pulse: 87  Temp: 97.9 F (36.6 C)  TempSrc: Oral  SpO2: 99%  Weight: 220 lb (99.8 kg)  Height: 5\' 4"  (1.626 m)      Assessment & Plan:

## 2017-06-05 NOTE — Assessment & Plan Note (Signed)
Rx for flexeril and letter written to try to appeal the decision about coverage for breast reduction surgery.

## 2017-06-05 NOTE — Patient Instructions (Signed)
We have given you a letter today to help get the surgery approved.   We have sent in the cream as well as the muscle relaxer.

## 2017-07-08 HISTORY — PX: BREAST REDUCTION SURGERY: SHX8

## 2017-09-23 ENCOUNTER — Ambulatory Visit (INDEPENDENT_AMBULATORY_CARE_PROVIDER_SITE_OTHER): Payer: Self-pay | Admitting: Internal Medicine

## 2017-09-23 ENCOUNTER — Encounter: Payer: Self-pay | Admitting: Internal Medicine

## 2017-09-23 DIAGNOSIS — H6691 Otitis media, unspecified, right ear: Secondary | ICD-10-CM

## 2017-09-23 MED ORDER — AMOXICILLIN-POT CLAVULANATE 875-125 MG PO TABS: 1.0000 | ORAL_TABLET | Freq: Two times a day (BID) | ORAL | 0 refills | Status: DC

## 2017-09-23 NOTE — Patient Instructions (Signed)
We have sent in the augmentin to take 1 pill twice a day for 1 week.    

## 2017-09-23 NOTE — Progress Notes (Signed)
   Subjective:    Patient ID: Carolyn Miranda, female    DOB: April 14, 1991, 27 y.o.   MRN: [id number]  HPI The patient is a 27 YO female coming in for 1-2 weeks of ear ache and sinus pressure. Taking otc claritin and could medicine dayquil and nyquil without relief. She is having some chills and fevers. Exposed to sick contact. Denies headaches. Some body aches but minimal. Fatigue. Having some sore throat and drainage. Overall worsening since onset.   Review of Systems  Constitutional: Positive for activity change, appetite change and chills. Negative for fatigue, fever and unexpected weight change.  HENT: Positive for congestion, ear discharge, ear pain, postnasal drip, rhinorrhea and sinus pressure. Negative for sinus pain, sneezing, sore throat, tinnitus, trouble swallowing and voice change.   Eyes: Negative.   Respiratory: Positive for cough. Negative for chest tightness, shortness of breath and wheezing.   Cardiovascular: Negative.   Gastrointestinal: Negative.   Musculoskeletal: Positive for back pain.  Neurological: Negative.       Objective:   Physical Exam  Constitutional: She is oriented to person, place, and time. She appears well-developed and well-nourished.  HENT:  Head: Normocephalic and atraumatic.  Oropharynx with redness and clear drainage, nose with swollen turbinates, TMs normal left, right TM with bulging and cloudy fluid  Eyes: EOM are normal.  Neck: Normal range of motion. No thyromegaly present.  Cardiovascular: Normal rate and regular rhythm.  Pulmonary/Chest: Effort normal and breath sounds normal. No respiratory distress. She has no wheezes. She has no rales.  Abdominal: Soft.  Musculoskeletal: She exhibits tenderness.  Lymphadenopathy:    She has no cervical adenopathy.  Neurological: She is alert and oriented to person, place, and time.  Skin: Skin is warm and dry.   Vitals:   09/23/17 1008  BP: 110/80  Pulse: 89  Temp: 97.7 F (36.5 C)  TempSrc:  Oral  SpO2: 98%  Weight: 222 lb (100.7 kg)  Height: 5\' 4"  (1.626 m)      Assessment & Plan:

## 2017-09-24 DIAGNOSIS — H6691 Otitis media, unspecified, right ear: Secondary | ICD-10-CM | POA: Insufficient documentation

## 2017-09-24 NOTE — Assessment & Plan Note (Signed)
Rx for augmentin and encouraged zyrtec.

## 2017-10-02 ENCOUNTER — Ambulatory Visit (INDEPENDENT_AMBULATORY_CARE_PROVIDER_SITE_OTHER): Payer: Self-pay | Admitting: Internal Medicine

## 2017-10-02 ENCOUNTER — Encounter: Payer: Self-pay | Admitting: Internal Medicine

## 2017-10-02 DIAGNOSIS — H6691 Otitis media, unspecified, right ear: Secondary | ICD-10-CM

## 2017-10-02 MED ORDER — FLUTICASONE PROPIONATE 50 MCG/ACT NA SUSP: 2.0000 | Freq: Every day | NASAL | 6 refills | Status: DC

## 2017-10-02 NOTE — Progress Notes (Signed)
   Subjective:    Patient ID: Carolyn Miranda, female    DOB: 07-05-1991, 27 y.o.   MRN: [id number]  HPI The patient is a 27 YO female coming in for right ear pain. She finished the augmentin and most of her sinus symptoms disappeared. She denies fevers or chills. No cough or SOB. She is overall feeling better. Denies left ear pain. Denies drainage. No headaches. She is not taking anything for this otc. She is still having a lot of stress lately.   Review of Systems  Constitutional: Negative for activity change, appetite change, chills, fatigue, fever and unexpected weight change.  HENT: Positive for congestion, ear pain, hearing loss, postnasal drip and rhinorrhea. Negative for ear discharge, sinus pressure, sinus pain, sneezing, sore throat, tinnitus, trouble swallowing and voice change.   Eyes: Negative.   Respiratory: Negative for cough, chest tightness, shortness of breath and wheezing.   Cardiovascular: Negative.   Gastrointestinal: Negative.   Neurological: Negative.       Objective:   Physical Exam  Constitutional: She is oriented to person, place, and time. She appears well-developed and well-nourished.  HENT:  Head: Normocephalic and atraumatic.  Oropharynx with redness and clear drainage, nose with swollen turbinates, TMs normal bilaterally  Eyes: EOM are normal.  Neck: Normal range of motion. No thyromegaly present.  Cardiovascular: Normal rate and regular rhythm.  Pulmonary/Chest: Effort normal and breath sounds normal. No respiratory distress. She has no wheezes. She has no rales.  Abdominal: Soft.  Lymphadenopathy:    She has no cervical adenopathy.  Neurological: She is alert and oriented to person, place, and time.  Skin: Skin is warm and dry.   Vitals:   10/02/17 1048  BP: 126/90  Pulse: 77  Temp: 97.8 F (36.6 C)  TempSrc: Oral  SpO2: 99%  Weight: 221 lb (100.2 kg)  Height: 5\' 4"  (1.626 m)      Assessment & Plan:

## 2017-10-02 NOTE — Assessment & Plan Note (Signed)
Resolved with augmentin but still persistent drainage. Rx for flonase and can use zyrtec otc.

## 2017-10-02 NOTE — Patient Instructions (Addendum)
I would have you start taking zyrtec or flonase to help with the drainage.   Use 2 sprays of flonase in each nostril once a day for the next 2 weeks to help.

## 2017-12-29 ENCOUNTER — Telehealth: Payer: Self-pay | Admitting: Internal Medicine

## 2017-12-29 NOTE — Telephone Encounter (Signed)
Patient would need to be seen for this. Advised that she be seen at [facility] or [facility] for a cheaper option.

## 2017-12-29 NOTE — Telephone Encounter (Signed)
Copied from CRM [id number]. Topic: Inquiry >> Dec 29, 2017  8:22 AM Maia Pettiesrtiz, Kristie S wrote: Reason for CRM: Pt called stating her left eye is red and dry inside. She has allergies but thinks this is pink eye. It was crusty when she woke up this morning. Pt does not have health insurance right now. She is asking if something can be sent in for her. She states having pink eye in March and it is about the same. Please call pt to advise.  [id number] [place] - [phone] [facility] [address] [facility] [address] [phone] (Phone) [phone] (Fax)

## 2018-03-25 ENCOUNTER — Ambulatory Visit (INDEPENDENT_AMBULATORY_CARE_PROVIDER_SITE_OTHER): Payer: Self-pay | Admitting: Internal Medicine

## 2018-03-25 ENCOUNTER — Encounter: Payer: Self-pay | Admitting: Internal Medicine

## 2018-03-25 VITALS — BP 130/88 | HR 87 | Temp 98.1°F | Ht 64.0 in | Wt 220.0 lb

## 2018-03-25 DIAGNOSIS — J301 Allergic rhinitis due to pollen: Secondary | ICD-10-CM

## 2018-03-25 MED ORDER — BENZONATATE 200 MG PO CAPS: 200.0000 mg | ORAL_CAPSULE | Freq: Three times a day (TID) | ORAL | 0 refills | Status: DC | PRN

## 2018-03-25 MED ORDER — FLUTICASONE PROPIONATE 50 MCG/ACT NA SUSP: 2.0000 | Freq: Every day | NASAL | 6 refills | Status: AC

## 2018-03-25 MED FILL — BENZONATATE 200 MG CAPS: 200 | 20 days supply | Qty: 60 | Fill #0

## 2018-03-25 MED FILL — FLUTICASONE PROP 50 MCG SPR: 50 | 30 days supply | Qty: 16 | Fill #0

## 2018-03-25 NOTE — Progress Notes (Signed)
   Subjective:    Patient ID: Carolyn Miranda, female    DOB: 1991-05-10, 27 y.o.   MRN: [id number]  HPI The patient is a 27 YO female coming in for sore throat and cold symptoms. Started 4-5 days ago. Has tried nothing over the counter. Overall it is stable since onset. Denies fevers or chills. Denies ear pain. Does have sore throat. Has been out of flonase but thinks that would help. Coughing and is worried about getting an asthma flare if this worsens. She has used albuterol some but not often recently. She is needing work note as she has started a new job in the last 2 months and she was out yesterday and today and her work requires a note if out more than 1 day.  Review of Systems  Constitutional: Positive for activity change and appetite change. Negative for chills, fatigue, fever and unexpected weight change.  HENT: Positive for congestion, postnasal drip and rhinorrhea. Negative for ear discharge, ear pain, sinus pressure, sinus pain, sneezing, sore throat, tinnitus, trouble swallowing and voice change.   Eyes: Negative.   Respiratory: Positive for cough. Negative for chest tightness, shortness of breath and wheezing.   Cardiovascular: Negative.   Gastrointestinal: Negative.   Musculoskeletal: Negative.   Neurological: Negative.       Objective:   Physical Exam  Constitutional: She is oriented to person, place, and time. She appears well-developed and well-nourished.  HENT:  Head: Normocephalic and atraumatic.  Oropharynx with redness and clear drainage, nose with swollen turbinates, TMs normal bilaterally  Eyes: EOM are normal.  Neck: Normal range of motion. No thyromegaly present.  Cardiovascular: Normal rate and regular rhythm.  Pulmonary/Chest: Effort normal and breath sounds normal. No respiratory distress. She has no wheezes. She has no rales.  Abdominal: Soft.  Musculoskeletal: She exhibits tenderness.  Lymphadenopathy:    She has no cervical adenopathy.  Neurological:  She is alert and oriented to person, place, and time.  Skin: Skin is warm and dry.   Vitals:   03/25/18 1004  BP: 130/88  Pulse: 87  Temp: 98.1 F (36.7 C)  TempSrc: Oral  SpO2: 98%  Weight: 220 lb (99.8 kg)  Height: 5\' 4"  (1.626 m)      Assessment & Plan:

## 2018-03-25 NOTE — Patient Instructions (Signed)
We have sent in flonase to use for the allergies. Also try zyrtec (cetirizine) daily to help with the drainage.   We have sent in tessalon perles to use for coughing up to 3 times per day.

## 2018-03-26 DIAGNOSIS — J309 Allergic rhinitis, unspecified: Secondary | ICD-10-CM | POA: Insufficient documentation

## 2018-03-26 NOTE — Assessment & Plan Note (Signed)
Rx for tessalon perles for cough. Has albuterol at home. Refill flonase. Advised to start zyrtec as well. No indication for steroids or antibiotics today.

## 2018-03-30 ENCOUNTER — Telehealth: Payer: Self-pay | Admitting: Internal Medicine

## 2018-03-30 NOTE — Telephone Encounter (Signed)
Copied from CRM [id number]. Topic: Inquiry >> Mar 30, 2018 12:46 PM Carolyn Miranda wrote: Reason for CRM: pt called and is still experiencing the same symptoms and problems from her last visit on the 29th; pt would like to have a medication called in to help w/ that; contact to advise

## 2018-03-30 NOTE — Telephone Encounter (Signed)
Called patient to get more information. Has she been taking the Zyrtec, flonase, and benzonatate pearls. What exactly are the symptoms patient is having.

## 2018-04-13 ENCOUNTER — Ambulatory Visit: Payer: Self-pay | Admitting: Internal Medicine

## 2018-04-15 ENCOUNTER — Telehealth: Payer: Self-pay | Admitting: Internal Medicine

## 2018-04-15 DIAGNOSIS — J45909 Unspecified asthma, uncomplicated: Secondary | ICD-10-CM

## 2018-04-15 NOTE — Telephone Encounter (Signed)
Copied from CRM [id number]. Topic: General - Other >> Apr 15, 2018 11:21 AM Gaynelle AduPoole, Shalonda wrote: Reason for CRM: patient is requesting labs for allergy testing.   LOV: 03/25/18 was about allergy, Please advise if patient needs another appointment.

## 2018-04-15 NOTE — Telephone Encounter (Signed)
Patient on the fence about seeing a specialist afraid it will cost more to see them but stated that if Dr. Okey Duprerawford wants to put a referral for an allergist in that she would be willing to do so because she has been having a lot of skin issues. Patient aware PCP is gone till first

## 2018-04-15 NOTE — Telephone Encounter (Signed)
Referral placed.

## 2018-04-15 NOTE — Telephone Encounter (Signed)
Generally the scratch test is more accurate and is done at the allergy office. Would she like to go see them for this?

## 2018-06-03 ENCOUNTER — Ambulatory Visit: Payer: Self-pay | Admitting: Allergy

## 2019-08-01 ENCOUNTER — Encounter (HOSPITAL_COMMUNITY): Payer: Self-pay | Admitting: Emergency Medicine

## 2019-08-01 ENCOUNTER — Other Ambulatory Visit: Payer: Self-pay

## 2019-08-01 ENCOUNTER — Emergency Department (HOSPITAL_COMMUNITY)
Admission: EM | Admit: 2019-08-01 | Discharge: 2019-08-01 | Disposition: A | Discharge status: Home / Self Care | Payer: BC Managed Care – PPO | Attending: Emergency Medicine | Admitting: Emergency Medicine

## 2019-08-01 ENCOUNTER — Emergency Department (HOSPITAL_COMMUNITY): Payer: BC Managed Care – PPO

## 2019-08-01 DIAGNOSIS — G930 Cerebral cysts: Secondary | ICD-10-CM | POA: Insufficient documentation

## 2019-08-01 DIAGNOSIS — R569 Unspecified convulsions: Secondary | ICD-10-CM | POA: Diagnosis present

## 2019-08-01 DIAGNOSIS — R110 Nausea: Secondary | ICD-10-CM | POA: Diagnosis not present

## 2019-08-01 LAB — URINALYSIS, ROUTINE W REFLEX MICROSCOPIC
Bilirubin Urine: NEGATIVE
Glucose, UA: NEGATIVE mg/dL
Ketones, ur: NEGATIVE mg/dL
Nitrite: NEGATIVE
Protein, ur: NEGATIVE mg/dL
Specific Gravity, Urine: 1.017 (ref 1.005–1.030)
pH: 5 (ref 5.0–8.0)

## 2019-08-01 LAB — I-STAT BETA HCG BLOOD, ED (MC, WL, AP ONLY): I-stat hCG, quantitative: <5 m[IU]/mL (ref <5)

## 2019-08-01 LAB — CBC WITH DIFFERENTIAL/PLATELET
Abs Immature Granulocytes: 0.06 10*3/uL (ref 0.00–0.07)
Basophils Absolute: 0 10*3/uL (ref 0.0–0.1)
Basophils Relative: 0 %
Eosinophils Absolute: 0.2 10*3/uL (ref 0.0–0.5)
Eosinophils Relative: 2 %
HCT: 42.2 % (ref 36.0–46.0)
Hemoglobin: 13.3 g/dL (ref 12.0–15.0)
Immature Granulocytes: 1 %
Lymphocytes Relative: 16 %
Lymphs Abs: 2 10*3/uL (ref 0.7–4.0)
MCH: 28.5 pg (ref 26.0–34.0)
MCHC: 31.5 g/dL (ref 30.0–36.0)
MCV: 90.6 fL (ref 80.0–100.0)
Monocytes Absolute: 0.7 10*3/uL (ref 0.1–1.0)
Monocytes Relative: 6 %
Neutro Abs: 9.5 10*3/uL — ABNORMAL HIGH (ref 1.7–7.7)
Neutrophils Relative %: 75 %
Platelets: 354 10*3/uL (ref 150–400)
RBC: 4.66 MIL/uL (ref 3.87–5.11)
RDW: 13.8 % (ref 11.5–15.5)
WBC: 12.5 10*3/uL — ABNORMAL HIGH (ref 4.0–10.5)
nRBC: 0 % (ref 0.0–0.2)

## 2019-08-01 LAB — COMPREHENSIVE METABOLIC PANEL
ALT: 29 U/L (ref 0–44)
AST: 35 U/L (ref 15–41)
Albumin: 3.4 g/dL — ABNORMAL LOW (ref 3.5–5.0)
Alkaline Phosphatase: 59 U/L (ref 38–126)
Anion gap: 12 (ref 5–15)
BUN: 14 mg/dL (ref 6–20)
CO2: 23 mmol/L (ref 22–32)
Calcium: 8.9 mg/dL (ref 8.9–10.3)
Chloride: 105 mmol/L (ref 98–111)
Creatinine, Ser: 0.7 mg/dL (ref 0.44–1.00)
GFR calc Af Amer: >60 mL/min (ref >60)
GFR calc non Af Amer: >60 mL/min (ref >60)
Glucose, Bld: 78 mg/dL (ref 70–99)
Potassium: 4.4 mmol/L (ref 3.5–5.1)
Sodium: 140 mmol/L (ref 135–145)
Total Bilirubin: 0.2 mg/dL — ABNORMAL LOW (ref 0.3–1.2)
Total Protein: 6.5 g/dL (ref 6.5–8.1)

## 2019-08-01 LAB — RAPID URINE DRUG SCREEN, HOSP PERFORMED
Amphetamines: NOT DETECTED
Barbiturates: NOT DETECTED
Benzodiazepines: NOT DETECTED
Cocaine: NOT DETECTED
Opiates: NOT DETECTED
Tetrahydrocannabinol: POSITIVE — AB

## 2019-08-01 LAB — MAGNESIUM: Magnesium: 2.2 mg/dL (ref 1.7–2.4)

## 2019-08-01 MED ORDER — ONDANSETRON 4 MG PO TBDP
4.0000 mg | ORAL_TABLET | Freq: Once | ORAL | Status: AC
  Administered 2019-08-01: 4 mg via ORAL
  Filled 2019-08-01: qty 1

## 2019-08-01 MED ORDER — ACETAMINOPHEN 325 MG PO TABS
650.0000 mg | ORAL_TABLET | Freq: Once | ORAL | Status: AC
  Administered 2019-08-01: 650 mg via ORAL
  Filled 2019-08-01: qty 2

## 2019-08-01 MED ORDER — SODIUM CHLORIDE 0.9 % IV BOLUS
1000.0000 mL | Freq: Once | INTRAVENOUS | Status: AC
  Administered 2019-08-01: 1000 mL via INTRAVENOUS

## 2019-08-01 NOTE — ED Triage Notes (Signed)
Pt arrives per  [facility] , with reports  Of witnessed sz like activity , s posturing of arms and legs foaming at the mouth was postictal  l on ems arrival now aaox4 , no hx  Of sz no h/a , no IV no fall in incontinence

## 2019-08-01 NOTE — ED Notes (Signed)
Walked patient to the bathroom patient did well 

## 2019-08-01 NOTE — ED Notes (Signed)
Patient is now back in bed with family at bedside 

## 2019-08-01 NOTE — ED Provider Notes (Signed)
[facility] EMERGENCY DEPARTMENT Provider Note   CSN: [id number] Arrival date & time: 08/01/19  1001     History Chief Complaint  Patient presents with  . Seizures    Carolyn Miranda is a 29 y.o. female.  HPI  29 year old female presents after a seizure.  Witnessed by mom.  Patient last remembers sitting in a chair and then waking up in the ambulance.  Apparently she had stiffening of all 4 extremities and was foaming at the mouth.  Was then postictal.  No prior history of seizures.  No recent illness such as fevers.  She gets headaches occasionally but no new or daily headache.  She is a little nauseated after waking up but no other complaints.  Did not bite her tongue. No numbness or weakness in the extremities. Has occasional alcohol use. Does not feel like she's getting poor sleep. No family history of seizures.  Past Medical History:  Diagnosis Date  . Anxiety   . Asthma   . Depression     Patient Active Problem List   Diagnosis Date Noted  . Allergic rhinitis 03/26/2018  . Neck pain 12/27/2015  . Encounter for birth control 08/24/2015  . Asthma 04/20/2015  . Adjustment disorder with mixed anxiety and depressed mood 01/16/2015  . Obesity 01/16/2015    Past Surgical History:  Procedure Laterality Date  . BREAST REDUCTION SURGERY Bilateral 07/08/2017     OB History   No obstetric history on file.     Family History  Problem Relation Age of Onset  . Mental illness Mother     Social History   Tobacco Use  . Smoking status: Never Smoker  . Smokeless tobacco: Never Used  Substance Use Topics  . Alcohol use: No    Alcohol/week: 0.0 standard drinks  . Drug use: No    Home Medications Prior to Admission medications   Medication Sig Start Date End Date Taking? Authorizing Provider  albuterol (PROVENTIL HFA;VENTOLIN HFA) 108 (90 Base) MCG/ACT inhaler Inhale 2 puffs into the lungs every 6 (six) hours as needed for wheezing or shortness of  breath. 04/18/16   Myrlene Broker, MD  benzonatate (TESSALON) 200 MG capsule Take 1 capsule (200 mg total) by mouth 3 (three) times daily as needed for cough. 03/25/18   Myrlene Broker, MD  fluticasone Aleda Grana) 50 MCG/ACT nasal spray Place 2 sprays into both nostrils daily. 03/25/18   Myrlene Broker, MD    Allergies    Celexa [citalopram hydrobromide]  Review of Systems   Review of Systems  Constitutional: Negative for fever.  Gastrointestinal: Positive for nausea. Negative for vomiting.  Musculoskeletal: Negative for neck pain.  Neurological: Positive for seizures. Negative for headaches.  Psychiatric/Behavioral: Negative for sleep disturbance.  All other systems reviewed and are negative.   Physical Exam Updated Vital Signs BP 128/79 (BP Location: Left Arm)   Pulse 90   Temp 99.6 F (37.6 C) (Oral)   Resp 18   SpO2 99%   Physical Exam Vitals and nursing note reviewed.  Constitutional:      General: She is not in acute distress.    Appearance: She is well-developed. She is not ill-appearing or diaphoretic.  HENT:     Head: Normocephalic and atraumatic.     Right Ear: External ear normal.     Left Ear: External ear normal.     Nose: Nose normal.  Eyes:     General:        Right  eye: No discharge.        Left eye: No discharge.     Extraocular Movements: Extraocular movements intact.     Pupils: Pupils are equal, round, and reactive to light.  Cardiovascular:     Rate and Rhythm: Normal rate and regular rhythm.     Heart sounds: Normal heart sounds.  Pulmonary:     Effort: Pulmonary effort is normal.     Breath sounds: Normal breath sounds.  Abdominal:     Palpations: Abdomen is soft.     Tenderness: There is no abdominal tenderness.  Musculoskeletal:     Cervical back: Normal range of motion and neck supple. No rigidity.  Skin:    General: Skin is warm and dry.  Neurological:     Mental Status: She is alert.     Comments: CN 3-12 grossly  intact. 5/5 strength in all 4 extremities. Grossly normal sensation. Normal finger to nose.   Psychiatric:        Mood and Affect: Mood is not anxious.     ED Results / Procedures / Treatments   Labs (all labs ordered are listed, but only abnormal results are displayed) Labs Reviewed  CBC WITH DIFFERENTIAL/PLATELET - Abnormal; Notable for the following components:      Result Value   WBC 12.5 (*)    Neutro Abs 9.5 (*)    All other components within normal limits  COMPREHENSIVE METABOLIC PANEL - Abnormal; Notable for the following components:   Albumin 3.4 (*)    Total Bilirubin 0.2 (*)    All other components within normal limits  RAPID URINE DRUG SCREEN, HOSP PERFORMED - Abnormal; Notable for the following components:   Tetrahydrocannabinol POSITIVE (*)    All other components within normal limits  URINALYSIS, ROUTINE W REFLEX MICROSCOPIC - Abnormal; Notable for the following components:   APPearance HAZY (*)    Hgb urine dipstick MODERATE (*)    Leukocytes,Ua TRACE (*)    Bacteria, UA RARE (*)    All other components within normal limits  MAGNESIUM  I-STAT BETA HCG BLOOD, ED ([facility], [facility], [facility] ONLY)    EKG None  Radiology CT HEAD WO CONTRAST  Result Date: 08/01/2019 CLINICAL DATA:  Seizure and headache EXAM: CT HEAD WITHOUT CONTRAST TECHNIQUE: Contiguous axial images were obtained from the base of the skull through the vertex without intravenous contrast. COMPARISON:  None. FINDINGS: Brain: The ventricles are normal in size and configuration. There is mild frontal atrophy for age bilaterally, however. Cisterna magna is rather prominent, an apparent anatomic variant. There is a suspected arachnoid cyst in the posterior fossa region measuring 3.0 x 2.4 cm. No other evidence of mass. There is no subdural or epidural fluid collection. No midline shift. No hemorrhage. Brain parenchyma appears unremarkable. No evident acute infarct. Vascular: No hyperdense vessel. No appreciable vascular  calcification. Skull: Bony calvarium appears intact. Sinuses/Orbits: There is a retention cyst in the right sphenoid sinus region. There is mucosal thickening in multiple ethmoid air cells. Other visualized paranasal sinuses are clear. Orbits appear symmetric bilaterally. Other: Mastoid air cells are clear. IMPRESSION: 1. Mild frontal atrophy for age. Ventricles normal in size and configuration. 2. Right-sided posterior fossa arachnoid cyst. No other evident mass. No hemorrhage or midline shift. 3.  Brain parenchyma appears unremarkable. 4.  There are areas of paranasal sinus disease. Electronically Signed   By: Bretta Bang III M.D.   On: 08/01/2019 12:37    Procedures Procedures (including critical care time)  Medications  Ordered in ED Medications  sodium chloride 0.9 % bolus 1,000 mL (0 mLs Intravenous Stopped 08/01/19 1513)  acetaminophen (TYLENOL) tablet 650 mg (650 mg Oral Given 08/01/19 1139)  ondansetron (ZOFRAN-ODT) disintegrating tablet 4 mg (4 mg Oral Given 08/01/19 1140)    ED Course  I have reviewed the triage vital signs and the nursing notes.  Pertinent labs & imaging results that were available during my care of the patient were reviewed by me and considered in my medical decision making (see chart for details).    MDM Rules/Calculators/A&P                      Patient presents with a first-time seizure.  Labs are overall reassuring.  CT shows an arachnoid cyst without other acute complication.  I discussed with neurology, Dr. Lorraine Lax, who has viewed the CT and feels this is unlikely to be causing her seizure today.  Would be okay with neurology outpatient follow-up.  Given this is first-time seizure and no clear cause, will hold on antiepileptics.  Patient instructed not to drive.  She otherwise has been referred to neurology and appears stable for discharge.  She does note an on and off headache and I think her headache today is from the seizure, though she was warned to watch  for worsening headaches. Final Clinical Impression(s) / ED Diagnoses Final diagnoses:  Seizure ([place])  Arachnoid cyst    Rx / DC Orders ED Discharge Orders         Ordered    Ambulatory referral to Neurology    Comments: An appointment is requested in approximately: 2 weeks   08/01/19 1247           Sherwood Gambler, MD 08/01/19 [phone]

## 2019-08-01 NOTE — Discharge Instructions (Signed)
-   According to [place] law, you can not drive unless you are seizure / syncope free for at least 6 months and under physician's care.  °  °- Please maintain precautions. Do not participate in activities where a loss of awareness could harm you or someone else. No swimming alone, no tub bathing, no hot tubs, no driving, no operating motorized vehicles (cars, ATVs, motocycles, etc), lawnmowers, power tools or firearms. No standing at heights, such as rooftops, ladders or stairs. Avoid hot objects such as stoves, heaters, open fires. Wear a helmet when riding a bicycle, scooter, skateboard, etc. and avoid areas of traffic. Set your water heater to 120 degrees or less.  °

## 2019-08-01 NOTE — ED Notes (Signed)
Patient verbalizes understanding of discharge instructions. Opportunity for questioning and answers were provided. Armband removed by staff, pt discharged from ED.  Ambulatory by self   

## 2019-08-02 ENCOUNTER — Ambulatory Visit: Payer: Self-pay | Admitting: Diagnostic Neuroimaging

## 2019-09-12 ENCOUNTER — Ambulatory Visit: Payer: Self-pay | Admitting: Diagnostic Neuroimaging

## 2019-11-01 DIAGNOSIS — Z5181 Encounter for therapeutic drug level monitoring: Secondary | ICD-10-CM | POA: Insufficient documentation

## 2019-11-02 DIAGNOSIS — R569 Unspecified convulsions: Secondary | ICD-10-CM | POA: Insufficient documentation

## 2020-12-26 ENCOUNTER — Other Ambulatory Visit (HOSPITAL_COMMUNITY): Payer: Self-pay

## 2020-12-26 MED ORDER — TOPIRAMATE 100 MG PO TABS
200.0000 mg | ORAL_TABLET | Freq: Every day | ORAL | 1 refills | Status: DC
  Filled 2020-12-26 – 2020-12-27 (×2): qty 180, 90d supply, fill #0
  Filled 2021-04-02: qty 180, 90d supply, fill #1

## 2020-12-27 ENCOUNTER — Other Ambulatory Visit (HOSPITAL_COMMUNITY): Payer: Self-pay

## 2021-01-11 ENCOUNTER — Other Ambulatory Visit (HOSPITAL_COMMUNITY): Payer: Self-pay

## 2021-01-11 MED ORDER — ALBUTEROL SULFATE HFA 108 (90 BASE) MCG/ACT IN AERS
1.0000 | INHALATION_SPRAY | RESPIRATORY_TRACT | 3 refills | Status: AC
  Filled 2021-01-11: qty 18, 33d supply, fill #0

## 2021-01-16 ENCOUNTER — Other Ambulatory Visit (HOSPITAL_COMMUNITY): Payer: Self-pay

## 2021-01-21 ENCOUNTER — Other Ambulatory Visit (HOSPITAL_COMMUNITY): Payer: Self-pay

## 2021-02-25 ENCOUNTER — Other Ambulatory Visit (HOSPITAL_COMMUNITY): Payer: Self-pay

## 2021-02-26 ENCOUNTER — Other Ambulatory Visit (HOSPITAL_COMMUNITY): Payer: Self-pay

## 2021-02-26 MED ORDER — CLINDAMYCIN PHOSPHATE 1 % EX GEL
CUTANEOUS | 3 refills | Status: AC
  Filled 2021-02-26: qty 30, 15d supply, fill #0

## 2021-02-26 MED ORDER — DOXYCYCLINE HYCLATE 100 MG PO TABS
100.0000 mg | ORAL_TABLET | Freq: Every day | ORAL | 0 refills | Status: DC
  Filled 2021-02-26: qty 14, 7d supply, fill #0

## 2021-04-02 ENCOUNTER — Other Ambulatory Visit (HOSPITAL_COMMUNITY): Payer: Self-pay

## 2021-04-03 ENCOUNTER — Other Ambulatory Visit (HOSPITAL_COMMUNITY): Payer: Self-pay

## 2021-07-02 ENCOUNTER — Other Ambulatory Visit (HOSPITAL_COMMUNITY): Payer: Self-pay

## 2021-07-02 MED ORDER — METHYLPREDNISOLONE 4 MG PO TBPK
ORAL_TABLET | ORAL | 0 refills | Status: DC
  Filled 2021-07-02: qty 21, 6d supply, fill #0

## 2021-07-03 ENCOUNTER — Other Ambulatory Visit (HOSPITAL_COMMUNITY): Payer: Self-pay

## 2021-07-10 ENCOUNTER — Other Ambulatory Visit (HOSPITAL_COMMUNITY): Payer: Self-pay

## 2021-07-11 ENCOUNTER — Other Ambulatory Visit (HOSPITAL_COMMUNITY): Payer: Self-pay

## 2021-07-11 MED ORDER — TOPIRAMATE 100 MG PO TABS
200.0000 mg | ORAL_TABLET | Freq: Every day | ORAL | 0 refills | Status: DC
  Filled 2021-07-11 (×3): qty 180, 90d supply, fill #0

## 2021-10-15 ENCOUNTER — Other Ambulatory Visit (HOSPITAL_COMMUNITY): Payer: Self-pay

## 2021-10-16 ENCOUNTER — Other Ambulatory Visit (HOSPITAL_COMMUNITY): Payer: Self-pay

## 2021-10-16 MED ORDER — TOPIRAMATE 100 MG PO TABS
ORAL_TABLET | ORAL | 0 refills | Status: DC
  Filled 2021-10-16 (×2): qty 180, 90d supply, fill #0

## 2021-10-16 MED ORDER — TOPIRAMATE 100 MG PO TABS
200.0000 mg | ORAL_TABLET | Freq: Every day | ORAL | 0 refills | Status: DC
  Filled 2021-10-16 – 2022-08-07 (×4): qty 180, 90d supply, fill #0

## 2021-10-22 ENCOUNTER — Other Ambulatory Visit (HOSPITAL_COMMUNITY): Payer: Self-pay

## 2021-10-22 MED ORDER — TOPIRAMATE 100 MG PO TABS
ORAL_TABLET | ORAL | 1 refills | Status: DC
  Filled 2021-10-22 – 2022-01-20 (×2): qty 180, 90d supply, fill #0
  Filled 2022-04-30: qty 180, 90d supply, fill #1

## 2021-12-05 ENCOUNTER — Other Ambulatory Visit (HOSPITAL_COMMUNITY): Payer: Self-pay

## 2021-12-05 MED ORDER — DOXYCYCLINE HYCLATE 100 MG PO TABS
ORAL_TABLET | ORAL | 0 refills | Status: DC
  Filled 2021-12-05: qty 14, 7d supply, fill #0

## 2021-12-31 ENCOUNTER — Other Ambulatory Visit (HOSPITAL_COMMUNITY): Payer: Self-pay

## 2021-12-31 MED ORDER — ALBUTEROL SULFATE HFA 108 (90 BASE) MCG/ACT IN AERS
INHALATION_SPRAY | RESPIRATORY_TRACT | 0 refills | Status: DC
  Filled 2021-12-31: qty 8.5, 30d supply, fill #0

## 2022-01-20 ENCOUNTER — Other Ambulatory Visit (HOSPITAL_COMMUNITY): Payer: Self-pay

## 2022-04-02 ENCOUNTER — Encounter: Payer: Self-pay | Admitting: Family Medicine

## 2022-04-02 ENCOUNTER — Ambulatory Visit (INDEPENDENT_AMBULATORY_CARE_PROVIDER_SITE_OTHER): Payer: Commercial Managed Care - HMO | Admitting: Family Medicine

## 2022-04-02 VITALS — BP 114/78 | HR 70 | Temp 97.8°F | Ht 65.0 in | Wt 214.0 lb

## 2022-04-02 DIAGNOSIS — M25551 Pain in right hip: Secondary | ICD-10-CM | POA: Diagnosis not present

## 2022-04-02 DIAGNOSIS — M79641 Pain in right hand: Secondary | ICD-10-CM | POA: Diagnosis not present

## 2022-04-02 DIAGNOSIS — M25552 Pain in left hip: Secondary | ICD-10-CM

## 2022-04-02 DIAGNOSIS — G8929 Other chronic pain: Secondary | ICD-10-CM

## 2022-04-02 DIAGNOSIS — M549 Dorsalgia, unspecified: Secondary | ICD-10-CM | POA: Insufficient documentation

## 2022-04-02 DIAGNOSIS — M79642 Pain in left hand: Secondary | ICD-10-CM

## 2022-04-02 DIAGNOSIS — J4520 Mild intermittent asthma, uncomplicated: Secondary | ICD-10-CM | POA: Diagnosis not present

## 2022-04-02 DIAGNOSIS — M797 Fibromyalgia: Secondary | ICD-10-CM | POA: Insufficient documentation

## 2022-04-02 DIAGNOSIS — R569 Unspecified convulsions: Secondary | ICD-10-CM

## 2022-04-02 DIAGNOSIS — F4323 Adjustment disorder with mixed anxiety and depressed mood: Secondary | ICD-10-CM

## 2022-04-02 DIAGNOSIS — E669 Obesity, unspecified: Secondary | ICD-10-CM | POA: Diagnosis not present

## 2022-04-02 DIAGNOSIS — R102 Pelvic and perineal pain: Secondary | ICD-10-CM

## 2022-04-02 LAB — CBC WITH DIFFERENTIAL/PLATELET
Basophils Absolute: 0 10*3/uL (ref 0.0–0.1)
Basophils Relative: 0.1 % (ref 0.0–3.0)
Eosinophils Absolute: 0.3 10*3/uL (ref 0.0–0.7)
Eosinophils Relative: 5.8 % — ABNORMAL HIGH (ref 0.0–5.0)
HCT: 43 % (ref 36.0–46.0)
Hemoglobin: 14.3 g/dL (ref 12.0–15.0)
Lymphocytes Relative: 34.5 % (ref 12.0–46.0)
Lymphs Abs: 1.8 10*3/uL (ref 0.7–4.0)
MCHC: 33.3 g/dL (ref 30.0–36.0)
MCV: 87.5 fl (ref 78.0–100.0)
Monocytes Absolute: 0.3 10*3/uL (ref 0.1–1.0)
Monocytes Relative: 5.8 % (ref 3.0–12.0)
Neutro Abs: 2.8 10*3/uL (ref 1.4–7.7)
Neutrophils Relative %: 53.8 % (ref 43.0–77.0)
Platelets: 258 10*3/uL (ref 150.0–400.0)
RBC: 4.91 Mil/uL (ref 3.87–5.11)
RDW: 13.5 % (ref 11.5–15.5)
WBC: 5.2 10*3/uL (ref 4.0–10.5)

## 2022-04-02 LAB — COMPREHENSIVE METABOLIC PANEL
ALT: 11 U/L (ref 0–35)
AST: 17 U/L (ref 0–37)
Albumin: 4.1 g/dL (ref 3.5–5.2)
Alkaline Phosphatase: 59 U/L (ref 39–117)
BUN: 10 mg/dL (ref 6–23)
CO2: 20 mEq/L (ref 19–32)
Calcium: 8.9 mg/dL (ref 8.4–10.5)
Chloride: 109 mEq/L (ref 96–112)
Creatinine, Ser: 0.58 mg/dL (ref 0.40–1.20)
GFR: 121.31 mL/min (ref >60.00)
Glucose, Bld: 82 mg/dL (ref 70–99)
Potassium: 3.8 mEq/L (ref 3.5–5.1)
Sodium: 137 mEq/L (ref 135–145)
Total Bilirubin: 0.5 mg/dL (ref 0.2–1.2)
Total Protein: 6.8 g/dL (ref 6.0–8.3)

## 2022-04-02 LAB — C-REACTIVE PROTEIN: CRP: <1 mg/dL (ref 0.5–20.0)

## 2022-04-02 LAB — SEDIMENTATION RATE: Sed Rate: 5 mm/hr (ref 0–20)

## 2022-04-02 LAB — TSH: TSH: 2.39 u[IU]/mL (ref 0.35–5.50)

## 2022-04-02 LAB — T4, FREE: Free T4: 0.75 ng/dL (ref 0.60–1.60)

## 2022-04-02 NOTE — Patient Instructions (Signed)
Please go downstairs for labs before you leave today.   I have referred you to psychiatry and they should call you soon.   We will be in touch with your lab results and the next step.

## 2022-04-02 NOTE — Assessment & Plan Note (Signed)
Continue seeing OB/GYN for this

## 2022-04-02 NOTE — Assessment & Plan Note (Signed)
Check labs due to chronic pain.

## 2022-04-02 NOTE — Assessment & Plan Note (Signed)
Managed by [facility] neurology.

## 2022-04-02 NOTE — Assessment & Plan Note (Signed)
Controlled. Rarely needs albuterol.  

## 2022-04-02 NOTE — Assessment & Plan Note (Signed)
Will check labs to look for underlying complications

## 2022-04-02 NOTE — Progress Notes (Signed)
New Patient Office Visit  Subjective    Patient ID: Carolyn Miranda, female    DOB: [birth year]  Age: 31 y.o. MRN: [id number]  CC:  Chief Complaint  Patient presents with   Establish Care    Chronic pain, can sometimes be her whole body but mostly centers around back and hip. Has flare ups when she is stressed, and there are instances where her bones and joints "lock up" on her, relys heavily on heating and ice packs. Also goes to chiropractor couple times a month which helps some but states when she has the pain it is excruciating pain where she is in tears as it feels her body is 52 years old.  Grandmother has fibromyalgia so she is concerned.    Depression    HPI Carolyn Miranda presents to establish care  Other providers:  Neurologist - she was seeing Dr. Lenise Arena and then insurance changed so she is now seeing [facility] neurology for seizures.  OB/GYN- [facility]   States she was diagnosed with epilepsy. Taking Topamax 200 mg at bedtime.   Asthma - triggers are temperature changes and humidity. Albuterol as needed. Controlled.   Anxiety and depression- has not seen psychiatrist since 2021. Is not currently on medication.  Anxiety is her biggest issue.   Chronic pain - states she has had pain most of her life. Back pain first, hip pain, pelvic floor pain, hand pain,  States she feels like her joints freeze up at times.  Denies erythema, increased warmth or edema of joints.   Takes ibuprofen before work, and then [employer], [facility] gummies other times.    States she gets brain fog.   Breast reduction at age 89.   Heavy periods.  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     Outpatient Encounter Medications as of 04/02/2022  Medication Sig   albuterol (PROVENTIL HFA;VENTOLIN HFA) 108 (90 Base) MCG/ACT inhaler Inhale 2 puffs into the lungs every 6 (six) hours as needed for wheezing or shortness of breath.    albuterol (VENTOLIN HFA) 108 (90 Base) MCG/ACT inhaler Inhale 1 puff into the lungs every 4 (four) hours.   clindamycin (CLINDAGEL) 1 % gel APPLY A THIN LAYER TO THE AFFECTED AREA(S) BY TOPICAL ROUTE 2 TIMES PER DAY   topiramate (TOPAMAX) 100 MG tablet Take 2 tablets (200 mg total) by mouth daily for seizure control   topiramate (TOPAMAX) 100 MG tablet Take 2 tablets by mouth daily at night   fluticasone (FLONASE) 50 MCG/ACT nasal spray Place 2 sprays into both nostrils daily. (Patient not taking: Reported on 04/02/2022)   [DISCONTINUED] albuterol (VENTOLIN HFA) 108 (90 Base) MCG/ACT inhaler Inhale 1 to 2 puffs every 4 to 6 hours as needed for wheezing for up to 30 days   [DISCONTINUED] benzonatate (TESSALON) 200 MG capsule Take 1 capsule (200 mg total) by mouth 3 (three) times daily as needed for cough.   [DISCONTINUED] doxycycline (VIBRA-TABS) 100 MG tablet Take 1 tablet (100 mg total) by mouth twice daily for 7 days   [DISCONTINUED] doxycycline (VIBRA-TABS) 100 MG tablet Take 1 tablet twice a day by mouth for 7 days.   [DISCONTINUED] methylPREDNISolone (MEDROL DOSEPAK) 4 MG TBPK tablet Use as directed within package   [DISCONTINUED] topiramate (TOPAMAX) 100 MG tablet Take 2 tablets by mouth daily for control of seizures.  Patient must have appointment for refills   [DISCONTINUED] topiramate (TOPAMAX) 100 MG  tablet Take 2 tablets (200 mg total) by mouth daily for control of seizures   No facility-administered encounter medications on file as of 04/02/2022.    Past Medical History:  Diagnosis Date   Anxiety    Asthma    Depression     Past Surgical History:  Procedure Laterality Date   BREAST REDUCTION SURGERY Bilateral 07/08/2017    Family History  Problem Relation Age of Onset   Mental illness Mother     Social History   Socioeconomic History   Marital status: Single    Spouse name: Not on file   Number of children: Not on file   Years of education: Not on file   Highest  education level: Not on file  Occupational History   Not on file  Tobacco Use   Smoking status: Never   Smokeless tobacco: Never  Substance and Sexual Activity   Alcohol use: No    Alcohol/week: 0.0 standard drinks of alcohol   Drug use: No   Sexual activity: Not on file  Other Topics Concern   Not on file  Social History Narrative   Not on file   Social Determinants of Health   Financial Resource Strain: Not on file  Food Insecurity: Not on file  Transportation Needs: Not on file  Physical Activity: Not on file  Stress: Not on file  Social Connections: Not on file  Intimate Partner Violence: Not on file    ROS Pertinent positives and negatives in the history of present illness.      Objective    BP 114/78 (BP Location: Left Arm, Patient Position: Sitting, Cuff Size: Large)   Pulse 70   Temp 97.8 F (36.6 C) (Temporal)   Ht 5\' 5"  (1.651 m)   Wt 214 lb (97.1 kg)   SpO2 96%   BMI 35.61 kg/m   Physical Exam Constitutional:      General: She is not in acute distress.    Appearance: She is not ill-appearing.  Cardiovascular:     Rate and Rhythm: Normal rate and regular rhythm.  Pulmonary:     Effort: Pulmonary effort is normal.     Breath sounds: Normal breath sounds.  Musculoskeletal:     Cervical back: Normal range of motion and neck supple.  Neurological:     Mental Status: She is alert and oriented to person, place, and time.  Psychiatric:        Mood and Affect: Mood normal.        Behavior: Behavior normal.        Thought Content: Thought content normal.         Assessment & Plan:   Problem List Items Addressed This Visit       Respiratory   Asthma    Controlled. Rarely needs albuterol.         Other   Adjustment disorder with mixed anxiety and depressed mood - Primary    Referral to psychiatrist. Also recommend counseling.       Relevant Orders   Ambulatory referral to Psychiatry   Bilateral hand pain    Check labs due to  chronic pain.       Relevant Orders   Sedimentation rate (Completed)   Rheumatoid factor   ANA   C-reactive protein (Completed)   Obesity (BMI 30-39.9)    Will check labs to look for underlying complications      Relevant Orders   CBC with Differential/Platelet (Completed)   Comprehensive metabolic panel (Completed)  TSH (Completed)   T4, free (Completed)   Other chronic pain    Unclear etiology. Will check labs including ANA, RF, sed rate, CRP, CBC, CMP      Relevant Orders   TSH (Completed)   T4, free (Completed)   Sedimentation rate (Completed)   Rheumatoid factor   ANA   C-reactive protein (Completed)   Pelvic pain    Continue seeing OB/GYN for this      Seizures [facility])    Managed by [facility] neurology.       Other Visit Diagnoses     Bilateral hip pain       Relevant Orders   Sedimentation rate (Completed)   Rheumatoid factor   ANA   C-reactive protein (Completed)       Return in about 4 weeks (around 04/30/2022) for multiple concerns .   Hetty Blend, NP-C

## 2022-04-02 NOTE — Assessment & Plan Note (Signed)
Unclear etiology. Will check labs including ANA, RF, sed rate, CRP, CBC, CMP

## 2022-04-02 NOTE — Assessment & Plan Note (Signed)
Referral to psychiatrist. Also recommend counseling.

## 2022-04-08 ENCOUNTER — Other Ambulatory Visit: Payer: Self-pay | Admitting: Family Medicine

## 2022-04-08 ENCOUNTER — Telehealth: Payer: Self-pay | Admitting: Family Medicine

## 2022-04-08 DIAGNOSIS — R760 Raised antibody titer: Secondary | ICD-10-CM

## 2022-04-08 DIAGNOSIS — M79641 Pain in right hand: Secondary | ICD-10-CM

## 2022-04-08 DIAGNOSIS — G8929 Other chronic pain: Secondary | ICD-10-CM

## 2022-04-08 LAB — ANA: Anti Nuclear Antibody (ANA): POSITIVE — AB

## 2022-04-08 LAB — RHEUMATOID FACTOR: Rheumatoid fact SerPl-aCnc: <14 IU/mL (ref <14)

## 2022-04-08 LAB — ANTI-NUCLEAR AB-TITER (ANA TITER): ANA Titer 1: 1:160 {titer} — ABNORMAL HIGH

## 2022-04-08 NOTE — Telephone Encounter (Signed)
Patient would like someone to call her to discuss her latest labs - please advise

## 2022-04-08 NOTE — Progress Notes (Signed)
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 I will refer her to rheumatology for further evaluation and treatment as needed.

## 2022-04-10 NOTE — Telephone Encounter (Signed)
Called pt with results.

## 2022-04-30 ENCOUNTER — Other Ambulatory Visit (HOSPITAL_COMMUNITY): Payer: Self-pay

## 2022-05-01 ENCOUNTER — Ambulatory Visit: Payer: Commercial Managed Care - HMO | Admitting: Family Medicine

## 2022-05-05 ENCOUNTER — Other Ambulatory Visit (HOSPITAL_COMMUNITY): Payer: Self-pay

## 2022-05-05 ENCOUNTER — Encounter (HOSPITAL_COMMUNITY): Payer: Self-pay | Admitting: Psychiatry

## 2022-05-05 ENCOUNTER — Ambulatory Visit (HOSPITAL_BASED_OUTPATIENT_CLINIC_OR_DEPARTMENT_OTHER): Payer: Commercial Managed Care - HMO | Admitting: Psychiatry

## 2022-05-05 DIAGNOSIS — F4010 Social phobia, unspecified: Secondary | ICD-10-CM | POA: Diagnosis not present

## 2022-05-05 DIAGNOSIS — F411 Generalized anxiety disorder: Secondary | ICD-10-CM | POA: Diagnosis not present

## 2022-05-05 MED ORDER — PROPRANOLOL HCL 10 MG PO TABS
10.0000 mg | ORAL_TABLET | Freq: Two times a day (BID) | ORAL | 1 refills | Status: DC | PRN
  Filled 2022-05-05: qty 60, 30d supply, fill #0

## 2022-05-05 MED ORDER — DULOXETINE HCL 30 MG PO CPEP
30.0000 mg | ORAL_CAPSULE | Freq: Every day | ORAL | 1 refills | Status: DC
  Filled 2022-05-05: qty 30, 30d supply, fill #0
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
## 2022-05-06 ENCOUNTER — Other Ambulatory Visit (HOSPITAL_COMMUNITY): Payer: Self-pay

## 2022-05-20 ENCOUNTER — Ambulatory Visit: Payer: Commercial Managed Care - HMO | Admitting: Family Medicine

## 2022-05-27 ENCOUNTER — Ambulatory Visit: Payer: Commercial Managed Care - HMO | Admitting: Family Medicine

## 2022-06-04 ENCOUNTER — Ambulatory Visit (INDEPENDENT_AMBULATORY_CARE_PROVIDER_SITE_OTHER): Payer: Commercial Managed Care - HMO | Admitting: Family Medicine

## 2022-06-04 ENCOUNTER — Other Ambulatory Visit (HOSPITAL_COMMUNITY): Payer: Self-pay

## 2022-06-04 ENCOUNTER — Ambulatory Visit (HOSPITAL_BASED_OUTPATIENT_CLINIC_OR_DEPARTMENT_OTHER): Payer: Commercial Managed Care - HMO | Admitting: Psychiatry

## 2022-06-04 ENCOUNTER — Encounter (HOSPITAL_COMMUNITY): Payer: Self-pay | Admitting: Psychiatry

## 2022-06-04 ENCOUNTER — Encounter: Payer: Self-pay | Admitting: Family Medicine

## 2022-06-04 VITALS — BP 100/72 | HR 75 | Temp 98.0°F | Ht 65.0 in | Wt 218.0 lb

## 2022-06-04 DIAGNOSIS — R569 Unspecified convulsions: Secondary | ICD-10-CM | POA: Diagnosis not present

## 2022-06-04 DIAGNOSIS — F4010 Social phobia, unspecified: Secondary | ICD-10-CM

## 2022-06-04 DIAGNOSIS — G8929 Other chronic pain: Secondary | ICD-10-CM

## 2022-06-04 DIAGNOSIS — F4323 Adjustment disorder with mixed anxiety and depressed mood: Secondary | ICD-10-CM

## 2022-06-04 DIAGNOSIS — F411 Generalized anxiety disorder: Secondary | ICD-10-CM | POA: Diagnosis not present

## 2022-06-04 DIAGNOSIS — K219 Gastro-esophageal reflux disease without esophagitis: Secondary | ICD-10-CM

## 2022-06-04 DIAGNOSIS — E669 Obesity, unspecified: Secondary | ICD-10-CM

## 2022-06-04 DIAGNOSIS — R760 Raised antibody titer: Secondary | ICD-10-CM

## 2022-06-04 MED ORDER — DULOXETINE HCL 60 MG PO CPEP
60.0000 mg | ORAL_CAPSULE | Freq: Every day | ORAL | 1 refills | Status: DC
  Filled 2022-06-04: qty 30, 30d supply, fill #0
  Filled 2022-07-07: qty 30, 30d supply, fill #1

## 2022-06-04 MED ORDER — DULOXETINE HCL 60 MG PO CPEP: 60.0000 mg | ORAL_CAPSULE | Freq: Every day | ORAL | 1 refills | Status: DC

## 2022-06-04 NOTE — Progress Notes (Signed)
Subjective:     Patient ID: Carolyn Miranda, female    DOB: 31-Oct-1990, 31 y.o.   MRN: [id number]  Chief Complaint  Patient presents with   Follow-up    4 week f/u, was able to see psyh and they put her on cymbalta. Feels a good difference     HPI Patient is in today for a follow up on multiple conditions including positive ANA with chronic pain.  Positive ANA with a titer 1:160. She has an appointment with rheumatology next month.   Neurologist - she was seeing Carolyn Miranda and then insurance changed so she is now seeing Carolyn Miranda neurology for seizures.  OB/GYN- [facility]   Seeing psychiatrist now. Taking Cymbalta. Her pain has improved slightly.   Occasionally has an upset "sour" stomach with acid reflux. This if new.  Not taking anything for it.   Denies fever, chills, dizziness, chest pain, palpitations, shortness of breath,  V/D, urinary symptoms.     Health Maintenance Due  Topic Date Due   TETANUS/TDAP  Never done   PAP SMEAR-Modifier  09/12/2015   COVID-19 Vaccine (4 - Pfizer risk series) 10/18/2020   INFLUENZA VACCINE  Never done    Past Medical History:  Diagnosis Date   Allergies    Anxiety    Asthma    Depression    Migraines    Seizure ([facility])    Urine incontinence     Past Surgical History:  Procedure Laterality Date   BREAST REDUCTION Miranda Bilateral 07/08/2017    Family History  Problem Relation Age of Onset   Mental illness Mother    Drug abuse Mother    Miscarriages / [place] Mother    Depression Mother    Arthritis Mother    Diabetes Father    Arthritis Maternal Grandmother    Asthma Maternal Grandmother     Social History   Socioeconomic History   Marital status: Single    Spouse name: Not on file   Number of children: Not on file   Years of education: Not on file   Highest education level: Not on file  Occupational History   Not on file  Tobacco Use   Smoking status: Never   Smokeless tobacco: Never   Substance and Sexual Activity   Alcohol use: No    Alcohol/week: 0.0 standard drinks of alcohol   Drug use: No   Sexual activity: Not on file  Other Topics Concern   Not on file  Social History Narrative   Not on file   Social Determinants of Health   Financial Resource Strain: Not on file  Food Insecurity: Not on file  Transportation Needs: Not on file  Physical Activity: Not on file  Stress: Not on file  Social Connections: Not on file  Intimate Partner Violence: Not on file    Outpatient Medications Prior to Visit  Medication Sig Dispense Refill   albuterol (PROVENTIL HFA;VENTOLIN HFA) 108 (90 Base) MCG/ACT inhaler Inhale 2 puffs into the lungs every 6 (six) hours as needed for wheezing or shortness of breath. 1 Inhaler 1   albuterol (VENTOLIN HFA) 108 (90 Base) MCG/ACT inhaler Inhale 1 puff into the lungs every 4 (four) hours. 18 g 3   clindamycin (CLINDAGEL) 1 % gel APPLY A THIN LAYER TO THE AFFECTED AREA(S) BY TOPICAL ROUTE 2 TIMES PER DAY 30 g 3   DULoxetine (CYMBALTA) 30 MG capsule Take 1 capsule (30 mg total) by mouth daily. 30 capsule 1   fluticasone (  FLONASE) 50 MCG/ACT nasal spray Place 2 sprays into both nostrils daily. 16 g 6   topiramate (TOPAMAX) 100 MG tablet Take 2 tablets (200 mg total) by mouth daily for seizure control 180 tablet 0   topiramate (TOPAMAX) 100 MG tablet Take 2 tablets by mouth daily at night 180 tablet 1   propranolol (INDERAL) 10 MG tablet Take 1 tablet (10 mg total) by mouth 2 (two) times daily as needed (anxiety). (Patient not taking: Reported on 06/04/2022) 60 tablet 1   No facility-administered medications prior to visit.    Allergies  Allergen Reactions   Bupropion Nausea And Vomiting   Lamotrigine Rash    Rash on left side of face Rash on left side of face Rash on left side of face Rash on left side of face    Celexa [Citalopram Hydrobromide] Diarrhea and Nausea And Vomiting   Other     Dairy     ROS     Objective:     Physical Exam Constitutional:      General: She is not in acute distress.    Appearance: She is not ill-appearing.  Cardiovascular:     Rate and Rhythm: Normal rate.  Pulmonary:     Effort: Pulmonary effort is normal.  Musculoskeletal:     Cervical back: Normal range of motion.  Neurological:     Mental Status: She is alert and oriented to person, place, and time.  Psychiatric:        Mood and Affect: Mood normal.        Behavior: Behavior normal.     BP 100/72 (BP Location: Left Arm, Patient Position: Sitting, Cuff Size: Large)   Pulse 75   Temp 98 F (36.7 C) (Temporal)   Ht 5\' 5"  (1.651 m)   Wt 218 lb (98.9 kg)   SpO2 97%   BMI 36.28 kg/m  Wt Readings from Last 3 Encounters:  06/04/22 218 lb (98.9 kg)  04/02/22 214 lb (97.1 kg)  03/25/18 220 lb (99.8 kg)       Assessment & Plan:   Problem List Items Addressed This Visit       Digestive   Gastroesophageal reflux disease     Other   Adjustment disorder with mixed anxiety and depressed mood   Antinuclear antibody (ANA) titer greater than 1:80   Obesity (BMI 30-39.9)   Other chronic pain - Primary   Seizures ([place])   Reviewed recent labs in person with patient including positive ANA.  She has upcoming appointment with rheumatology. She is now under the care of a psychiatrist and has an appointment later today.  Recently started Cymbalta.  Chronic pain has slightly improved. She is also under the care of other specialist including neurology and OB/GYN. Discussed trying Pepcid over-the-counter for acid reflux.  Counseling on lifestyle modifications to reduce symptoms.  Handout was provided on acid reflux and food choices.  Answered all of her questions. Follow-up as needed or in 6 months.  I am having Carolyn Miranda maintain her albuterol, fluticasone, albuterol, clindamycin, topiramate, topiramate, DULoxetine, and propranolol.  No orders of the defined types were placed in this encounter.

## 2022-06-04 NOTE — Progress Notes (Signed)
Carolyn Miranda/PA/NP OP Progress Note  06/04/2022 1:25 PM Carolyn Miranda  MRN:  [id number]  Visit Diagnosis:    ICD-10-CM   1. Social anxiety disorder  F40.10     2. GAD (generalized anxiety disorder)  F41.1       Assessment: Carolyn Miranda is a 31 y.o. y.o. female with a history of anxiety, a seizure disorder, and chronic pain who presented to [facility] for initial evaluation on 05/05/22.    During initial evaluation patient reported symptoms of anxiety including constant worry, racing thoughts, ruminations on past interactions, feeling overwhelmed, and impending dread for social interactions.  Patient endorsed experiencing restlessness secondary to her anxiety.  Patient denied any history of mania, psychosis, paranoia, SI/HI, or delusions.  Patient questioned diagnosis of ADHD and reported having done well in both school growing up and work more recently.  Of note patient was diagnosed with a seizure disorder in 2021 and there is concern of a possible autoimmune condition due to her elevated ANA.  She is scheduled with a rheumatologist for this and her chronic pain in December.  Patient met criteria for diagnosis of generalized anxiety disorder and social anxiety disorder and would benefit from medication management and connection with therapy.  While diagnosis of ADHD cannot be ruled out it does seem less likely as potential symptoms are more likely to be related to her anxiety.  Carolyn Miranda presents for follow-up evaluation. Today, 06/04/22, patient reports some improvement in her constant worry, racing thoughts, impending dread, and overall anxiety in social situations.  She has also noticed a decreased in her pain symptoms since starting the Cymbalta.  Patient reports some increased and constipation and was recommended to try using a stool softener regularly.  Patient has used the propranolol on a few different occasions and found good benefit in  increasing her anxiety.  At times the dose can be over sedating we discussed the possibility of decreasing to 5 mg.  Patient will consider but wanted to continue at the 10 mg dose at this time.  We will increase Cymbalta to 60 mg daily and follow-up in a month.    Plan: - Increase Cymbalta 60 mg daily - Continue propranolol 10 mg BID prn for anxiety - Topamax 200 mg QD for seizures managed by her neurologist - CBC, CMP, TSH, ANA reviewed - Therapy referral, patient will reach out again - Follow up in a month   Chief Complaint:  Chief Complaint  Patient presents with   Follow-up   Medication Refill   HPI: Patient presents reporting she has been a bit flustered today.  She found out this morning that her boyfriend lost his job unexpectedly and this has brought on increased stress.  Financially he has been covering a bit more of the rent and she does not make enough to cover it without his support.  Patient has been processing this though and is starting to look at the silver lining.  For instance she knows that her boyfriend did not like his job and he will receive severance from this.  She is hopeful he will be able to find something more in line with what he is interested in.  Regarding her mood patient reports that her anxiety symptoms have felt a bit better over the past month while her depression has been about the same.  She has found the Cymbalta to help with her impending anxiety prior to going to work and with the interaction  she has with her supervisor.  She has also found benefit in the pain management as she has been able to be more active lately compared to the past.  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  [facility] has noted some increased constipation with the Cymbalta and we discussed the possibility of taking a stool softener regularly or as needed to help that.  Regarding the propranolol  [name] reports having tried on a few different occasions.  She notes that it really helped manage increased anxiety she had about some social situations.  At times however it is over sedating especially if she takes it at night.  We discussed the option of decreasing the dose to 5 mg however patient felt that the 10 mg dose was sufficient for now.  Patient was interested in increasing Cymbalta today and we discussed the risk and benefits of going up to 60 mg daily.  Patient had called for a therapist but has not heard back she plans to follow up again.  Past Psychiatric History: Has been on medications in the past. Has tried Wellbutrin, Zoloft, possibly Prozac. Lamictal got a rash. Tried BuSpar which she think helped some.  Patient had a therapist in the past which she thought was helpful.  Increase Cymbalta to 60 mg daily on 06/04/2022.  Past Medical History:  Past Medical History:  Diagnosis Date   Allergies    Anxiety    Asthma    Depression    Migraines    Seizure ([place])    Urine incontinence     Past Surgical History:  Procedure Laterality Date   BREAST REDUCTION SURGERY Bilateral 07/08/2017    Family Psychiatric History: Denies  Family History:  Family History  Problem Relation Age of Onset   Mental illness Mother    Drug abuse Mother    Miscarriages / [place] Mother    Depression Mother    Arthritis Mother    Diabetes Father    Arthritis Maternal Grandmother    Asthma Maternal Grandmother     Social History:  Social History   Socioeconomic History   Marital status: Single    Spouse name: Not on file   Number of children: Not on file   Years of education: Not on file   Highest education level: Not on file  Occupational History   Not on file  Tobacco Use   Smoking status: Never   Smokeless tobacco: Never  Substance and Sexual Activity   Alcohol use: No    Alcohol/week: 0.0 standard drinks of alcohol   Drug use: No   Sexual activity: Not on file  Other  Topics Concern   Not on file  Social History Narrative   Not on file   Social Determinants of Health   Financial Resource Strain: Not on file  Food Insecurity: Not on file  Transportation Needs: Not on file  Physical Activity: Not on file  Stress: Not on file  Social Connections: Not on file    Allergies:  Allergies  Allergen Reactions   Bupropion Nausea And Vomiting   Lamotrigine Rash    Rash on left side of face Rash on left side of face Rash on left side of face Rash on left side of face    Celexa [Citalopram Hydrobromide] Diarrhea and Nausea And Vomiting   Other     Dairy     Current Medications: Current Outpatient Medications  Medication Sig Dispense Refill   albuterol (PROVENTIL HFA;VENTOLIN HFA) 108 (90 Base) MCG/ACT inhaler Inhale 2 puffs into the lungs every 6 (six) hours as needed for wheezing or shortness of breath. 1 Inhaler 1   albuterol (VENTOLIN HFA) 108 (90 Base) MCG/ACT inhaler Inhale 1 puff into the lungs every 4 (four) hours. 18 g 3   clindamycin (CLINDAGEL) 1 % gel APPLY A THIN LAYER TO THE AFFECTED AREA(S) BY TOPICAL ROUTE 2 TIMES PER DAY 30 g 3   DULoxetine (CYMBALTA) 30 MG capsule Take 1 capsule (30 mg total) by mouth daily. 30 capsule 1   fluticasone (FLONASE) 50 MCG/ACT nasal spray Place 2 sprays into both nostrils daily. 16 g 6   propranolol (INDERAL) 10 MG tablet Take 1 tablet (10 mg total) by mouth 2 (two) times daily as needed (anxiety). (Patient not taking: Reported on 06/04/2022) 60 tablet 1   topiramate (TOPAMAX) 100 MG tablet Take 2 tablets (200 mg total) by mouth daily for seizure control 180 tablet 0   topiramate (TOPAMAX) 100 MG tablet Take 2 tablets by mouth daily at night 180 tablet 1   No current facility-administered medications for this visit.     Musculoskeletal: Strength & Muscle Tone: within normal limits Gait & Station: normal Patient leans: N/A  Psychiatric Specialty Exam: Review of Systems  There were no vitals taken  for this visit.There is no height or weight on file to calculate BMI.  General Appearance: Fairly Groomed  Eye Contact:  Good  Speech:  Clear and Coherent and Normal Rate  Volume:  Normal  Mood:  Depressed and Euthymic  Affect:  Constricted  Thought Process:  Coherent and Linear  Orientation:  Full (Time, Place, and Person)  Thought Content: Logical   Suicidal Thoughts:  No  Homicidal Thoughts:  No  Memory:  Immediate;   Good  Judgement:  Good  Insight:  Fair  Psychomotor Activity:  Normal  Concentration:  Concentration: Good  Recall:  Good  Fund of Knowledge: Good  Language: Good  Akathisia:  NA    AIMS (if indicated): not done  Assets:  Communication Skills Desire for Improvement Housing Social Support  ADL's:  Intact  Cognition: WNL  Sleep:  Good   Metabolic Disorder Labs: Lab Results  Component Value Date   HGBA1C 4.7 01/16/2015   No results found for: "PROLACTIN" Lab Results  Component Value Date   CHOL 204 (H) 01/16/2015   TRIG 358.0 (H) 01/16/2015   HDL 47.80 01/16/2015   CHOLHDL 4 01/16/2015   VLDL 71.6 (H) 01/16/2015   Lab Results  Component Value Date   TSH 2.39 04/02/2022   TSH 4.54 (H) 01/16/2015    Therapeutic Level Labs: No results found for: "LITHIUM" No results found for: "VALPROATE" No results found for: "CBMZ"   Screenings: PHQ2-9    Flowsheet Row Office Visit from 05/05/2022 in [facility]  PHQ-2 Total Score 4  PHQ-9 Total Score 11       Collaboration of Care: Collaboration of Care: Medication Management AEB medication prescription and Primary Care Provider AEB chart review  Patient/Guardian was advised Release of Information must be obtained prior to any record release in order to collaborate their care with an outside provider. Patient/Guardian was advised if they have not already done so to contact the registration department to sign all necessary forms in order for [place] to release  information regarding their care.   Consent: Patient/Guardian gives verbal consent for treatment and assignment of benefits  for services provided during this visit. Patient/Guardian expressed understanding and agreed to proceed.    Vista Mink, Miranda 06/04/2022, 1:25 PM

## 2022-06-04 NOTE — Patient Instructions (Signed)
Try taking over the counter Pepcid for acid reflux.   Avoid eating and laying down.  Avoid overeating.  Look for triggers and try to avoid spicy, acidic and fried foods.    Food Choices for Gastroesophageal Reflux Disease, Adult When you have gastroesophageal reflux disease (GERD), the foods you eat and your eating habits are very important. Choosing the right foods can help ease the discomfort of GERD. Consider working with a dietitian to help you make healthy food choices. What are tips for following this plan? Reading food labels Look for foods that are low in saturated fat. Foods that have less than 5% of daily value (DV) of fat and 0 g of trans fats may help with your symptoms. Cooking Cook foods using methods other than frying. This may include baking, steaming, grilling, or broiling. These are all methods that do not need a lot of fat for cooking. To add flavor, try to use herbs that are low in spice and acidity. Meal planning  Choose healthy foods that are low in fat, such as fruits, vegetables, whole grains, low-fat dairy products, lean meats, fish, and poultry. Eat frequent, small meals instead of three large meals each day. Eat your meals slowly, in a relaxed setting. Avoid bending over or lying down until 2-3 hours after eating. Limit high-fat foods such as fatty meats or fried foods. Limit your intake of fatty foods, such as oils, butter, and shortening. Avoid the following as told by your health care provider: Foods that cause symptoms. These may be different for different people. Keep a food diary to keep track of foods that cause symptoms. Alcohol. Drinking large amounts of liquid with meals. Eating meals during the 2-3 hours before bed. Lifestyle Maintain a healthy weight. Ask your health care provider what weight is healthy for you. If you need to lose weight, work with your health care provider to do so safely. Exercise for at least 30 minutes on 5 or more days each  week, or as told by your health care provider. Avoid wearing clothes that fit tightly around your waist and chest. Do not use any products that contain nicotine or tobacco. These products include cigarettes, chewing tobacco, and vaping devices, such as e-cigarettes. If you need help quitting, ask your health care provider. Sleep with the head of your bed raised. Use a wedge under the mattress or blocks under the bed frame to raise the head of the bed. Chew sugar-free gum after mealtimes. What foods should I eat?  Eat a healthy, well-balanced diet of fruits, vegetables, whole grains, low-fat dairy products, lean meats, fish, and poultry. Each person is different. Foods that may trigger symptoms in one person may not trigger any symptoms in another person. Work with your health care provider to identify foods that are safe for you. The items listed above may not be a complete list of recommended foods and beverages. Contact a dietitian for more information. What foods should I avoid? Limiting some of these foods may help manage the symptoms of GERD. Everyone is different. Consult a dietitian or your health care provider to help you identify the exact foods to avoid, if any. Fruits Any fruits prepared with added fat. Any fruits that cause symptoms. For some people this may include citrus fruits, such as oranges, grapefruit, pineapple, and lemons. Vegetables Deep-fried vegetables. [place] fries. Any vegetables prepared with added fat. Any vegetables that cause symptoms. For some people, this may include tomatoes and tomato products, chili peppers, onions and garlic,  and horseradish. Grains Pastries or quick breads with added fat. Meats and other proteins High-fat meats, such as fatty beef or pork, hot dogs, ribs, ham, sausage, salami, and bacon. Fried meat or protein, including fried fish and fried chicken. Nuts and nut butters, in large amounts. Dairy Whole milk and chocolate milk. Sour cream. Cream.  Ice cream. Cream cheese. Milkshakes. Fats and oils Butter. Margarine. Shortening. Ghee. Beverages Coffee and tea, with or without caffeine. Carbonated beverages. Sodas. Energy drinks. Fruit juice made with acidic fruits, such as orange or grapefruit. Tomato juice. Alcoholic drinks. Sweets and desserts Chocolate and cocoa. Donuts. Seasonings and condiments Pepper. Peppermint and spearmint. Added salt. Any condiments, herbs, or seasonings that cause symptoms. For some people, this may include curry, hot sauce, or vinegar-based salad dressings. The items listed above may not be a complete list of foods and beverages to avoid. Contact a dietitian for more information. Questions to ask your health care provider Diet and lifestyle changes are usually the first steps that are taken to manage symptoms of GERD. If diet and lifestyle changes do not improve your symptoms, talk with your health care provider about taking medicines. Where to find more information International Foundation for Gastrointestinal Disorders: aboutgerd.org Summary When you have gastroesophageal reflux disease (GERD), food and lifestyle choices may be very helpful in easing the discomfort of GERD. Eat frequent, small meals instead of three large meals each day. Eat your meals slowly, in a relaxed setting. Avoid bending over or lying down until 2-3 hours after eating. Limit high-fat foods such as fatty meats or fried foods. This information is not intended to replace advice given to you by your health care provider. Make sure you discuss any questions you have with your health care provider. Document Revised: 01/23/2020 Document Reviewed: 01/23/2020 Elsevier Patient Education  2023 [employer].

## 2022-06-06 ENCOUNTER — Other Ambulatory Visit (HOSPITAL_COMMUNITY): Payer: Self-pay

## 2022-07-01 NOTE — Progress Notes (Unsigned)
Office Visit Note  Patient: Carolyn Miranda             Date of Birth: Aug 20, 1990           MRN: [id number]             PCP: Girtha Rm, NP-C Referring: Girtha Rm, NP-C Visit Date: 07/02/2022 Occupation: [employer]  Subjective:   History of Present Illness: Carolyn Miranda is a 31 y.o. female here for evaluation of positive ANA associated with chronic pain in multiple areas worst in back and hips discussed with PCP office and somewhat episodic. Reports family history of fibromyalgia.***   Last 1 year everything worse Standing 8 hrs job intolerable edema 1/2 to knee Fatigue, concentration Sleep disturbances Left hip and low back Scattered rashes coming and going? Family history of FMS  ***  Labs reviewed ANA 1:160 nucleolar RF neg ESR 5 CRP <1   Activities of Daily Living:  Patient reports morning stiffness for a couple of  hours.   Patient Reports nocturnal pain.  Difficulty dressing/grooming: Denies Difficulty climbing stairs: Denies Difficulty getting out of chair: Denies Difficulty using hands for taps, buttons, cutlery, and/or writing: Denies  Review of Systems  Constitutional:  Positive for fatigue.  HENT:  Positive for mouth dryness. Negative for mouth sores.   Eyes:  Positive for dryness.  Respiratory:  Positive for shortness of breath.   Cardiovascular:  Negative for chest pain and palpitations.  Gastrointestinal:  Positive for constipation. Negative for blood in stool and diarrhea.  Endocrine: Negative for increased urination.  Genitourinary:  Negative for involuntary urination.  Musculoskeletal:  Positive for joint pain, joint pain, joint swelling, myalgias, muscle weakness, morning stiffness, muscle tenderness and myalgias. Negative for gait problem.  Skin:  Positive for rash. Negative for color change, hair loss and sensitivity to sunlight.  Allergic/Immunologic: Negative for susceptible to infections.  Neurological:  Negative for  dizziness and headaches.  Hematological:  Negative for swollen glands.  Psychiatric/Behavioral:  Positive for sleep disturbance. Negative for depressed mood. The patient is nervous/anxious.     PMFS History:  Patient Active Problem List   Diagnosis Date Noted   Gastroesophageal reflux disease 06/04/2022   Social anxiety disorder 05/05/2022   GAD (generalized anxiety disorder) 05/05/2022   Antinuclear antibody (ANA) titer greater than 1:80 04/08/2022   Back pain 04/02/2022   Other chronic pain 04/02/2022   Pelvic pain 04/02/2022   Bilateral hand pain 04/02/2022   Seizures ([place]) 11/02/2019   Encounter for monitoring anticonvulsant therapy 11/01/2019   Allergic rhinitis 03/26/2018   Neck pain 12/27/2015   Encounter for birth control 08/24/2015   Asthma 04/20/2015   Adjustment disorder with mixed anxiety and depressed mood 01/16/2015   Obesity (BMI 30-39.9) 01/16/2015    Past Medical History:  Diagnosis Date   Allergies    Anxiety    Asthma    Depression    Migraines    Seizure ([place])    Urine incontinence     Family History  Problem Relation Age of Onset   Mental illness Mother    Drug abuse Mother        overmedicating rx by psych   Miscarriages / [place] Mother    Depression Mother    Arthritis Mother    Diabetes Father    Arthritis Maternal Grandmother    Asthma Maternal Grandmother    Past Surgical History:  Procedure Laterality Date   BREAST REDUCTION SURGERY Bilateral 07/08/2017   WISDOM TOOTH EXTRACTION  Social History   Social History Narrative   Not on file   Immunization History  Administered Date(s) Administered   PFIZER Comirnaty(Gray Top)Covid-19 Tri-Sucrose Vaccine 08/23/2020   PFIZER(Purple Top)SARS-COV-2 Vaccination 01/09/2020, 01/30/2020   PPD Test 03/01/2019     Objective: Vital Signs: BP 113/83 (BP Location: Left Arm, Patient Position: Sitting, Cuff Size: Normal)   Pulse 80   Resp 17   Ht 5' 4.5" (1.638 m)   Wt 216 lb 6.4 oz  (98.2 kg)   BMI 36.57 kg/m    Physical Exam Constitutional:      Appearance: She is obese.  HENT:     Mouth/Throat:     Mouth: Mucous membranes are moist.     Pharynx: Oropharynx is clear.  Eyes:     Conjunctiva/sclera: Conjunctivae normal.  Cardiovascular:     Rate and Rhythm: Normal rate and regular rhythm.  Pulmonary:     Effort: Pulmonary effort is normal.     Breath sounds: Normal breath sounds.  Musculoskeletal:     Right lower leg: No edema.     Left lower leg: No edema.  Skin:    General: Skin is warm and dry.     Findings: No rash.  Neurological:     Mental Status: She is alert.      Musculoskeletal Exam:  Neck full ROM no tenderness Shoulders full ROM no tenderness or swelling Elbows full ROM no tenderness or swelling Wrists full ROM no tenderness or swelling Fingers full ROM no tenderness or swelling Mild diffuse back tendenress to pressure in upper back and shoulders, worse at low back midline and bilateral worse on left L>R lateral hip tenderness to pressure, full ROM intact Knees full ROM no tenderness or swelling Ankles full ROM no tenderness or swelling   Investigation: No additional findings.  Imaging: No results found.  Recent Labs: Lab Results  Component Value Date   WBC 5.2 04/02/2022   HGB 14.3 04/02/2022   PLT 258.0 04/02/2022   NA 137 04/02/2022   K 3.8 04/02/2022   CL 109 04/02/2022   CO2 20 04/02/2022   GLUCOSE 82 04/02/2022   BUN 10 04/02/2022   CREATININE 0.58 04/02/2022   BILITOT 0.5 04/02/2022   ALKPHOS 59 04/02/2022   AST 17 04/02/2022   ALT 11 04/02/2022   PROT 6.8 04/02/2022   ALBUMIN 4.1 04/02/2022   CALCIUM 8.9 04/02/2022   GFRAA >60 08/01/2019    Speciality Comments: No specialty comments available.  Procedures:  No procedures performed Allergies: Bupropion, Lamotrigine, Celexa [citalopram hydrobromide], and Other   Assessment / Plan:     Visit Diagnoses: Antinuclear antibody (ANA) titer greater than 1:80  - Plan: Anti-scleroderma antibody, RNP Antibody, Anti-Smith antibody, Sjogrens syndrome-A extractable nuclear antibody, Anti-DNA antibody, double-stranded, C3 and C4, Cyclic citrul peptide antibody, IgG  Other chronic pain  Bilateral hand pain  Orders: Orders Placed This Encounter  Procedures   Anti-scleroderma antibody   RNP Antibody   Anti-Smith antibody   Sjogrens syndrome-A extractable nuclear antibody   Anti-DNA antibody, double-stranded   C3 and C4   Cyclic citrul peptide antibody, IgG   No orders of the defined types were placed in this encounter.   Face-to-face time spent with patient was *** minutes. Greater than 50% of time was spent in counseling and coordination of care.  Follow-Up Instructions: Return in about 2 weeks (around 07/16/2022) for New pt FMS/+ANA f/u 2wks.   Collier Salina, MD  Note - This record has been created using  Editor, commissioning.  Chart creation errors have been sought, but may not always  have been located. Such creation errors do not reflect on  the standard of medical care.

## 2022-07-02 ENCOUNTER — Ambulatory Visit: Payer: Commercial Managed Care - HMO | Attending: Internal Medicine | Admitting: Internal Medicine

## 2022-07-02 ENCOUNTER — Encounter: Payer: Self-pay | Admitting: Internal Medicine

## 2022-07-02 VITALS — BP 113/83 | HR 80 | Resp 17 | Ht 64.5 in | Wt 216.4 lb

## 2022-07-02 DIAGNOSIS — M79641 Pain in right hand: Secondary | ICD-10-CM

## 2022-07-02 DIAGNOSIS — R760 Raised antibody titer: Secondary | ICD-10-CM | POA: Diagnosis not present

## 2022-07-02 DIAGNOSIS — G8929 Other chronic pain: Secondary | ICD-10-CM | POA: Diagnosis not present

## 2022-07-02 DIAGNOSIS — M79642 Pain in left hand: Secondary | ICD-10-CM | POA: Diagnosis not present

## 2022-07-02 NOTE — Patient Instructions (Signed)
I recommend checking out the [employer] patient-centered guide for fibromyalgia and chronic pain management: fibroguide.med.[employer].edu   

## 2022-07-04 LAB — ANTI-DNA ANTIBODY, DOUBLE-STRANDED: ds DNA Ab: <1 IU/mL

## 2022-07-04 LAB — ANTI-SCLERODERMA ANTIBODY: Scleroderma (Scl-70) (ENA) Antibody, IgG: <1 AI

## 2022-07-04 LAB — ANTI-SMITH ANTIBODY: ENA SM Ab Ser-aCnc: <1 AI

## 2022-07-04 LAB — RNP ANTIBODY: Ribonucleic Protein(ENA) Antibody, IgG: <1 AI

## 2022-07-04 LAB — C3 AND C4
C3 Complement: 141 mg/dL (ref 83–193)
C4 Complement: 26 mg/dL (ref 15–57)

## 2022-07-04 LAB — SJOGRENS SYNDROME-A EXTRACTABLE NUCLEAR ANTIBODY: SSA (Ro) (ENA) Antibody, IgG: <1 AI

## 2022-07-04 LAB — CYCLIC CITRUL PEPTIDE ANTIBODY, IGG: Cyclic Citrullin Peptide Ab: <16 UNITS

## 2022-07-07 ENCOUNTER — Other Ambulatory Visit (HOSPITAL_COMMUNITY): Payer: Self-pay

## 2022-07-07 ENCOUNTER — Other Ambulatory Visit (HOSPITAL_COMMUNITY): Payer: Self-pay | Admitting: *Deleted

## 2022-07-07 DIAGNOSIS — F4010 Social phobia, unspecified: Secondary | ICD-10-CM

## 2022-07-07 DIAGNOSIS — F411 Generalized anxiety disorder: Secondary | ICD-10-CM

## 2022-07-07 MED ORDER — DULOXETINE HCL 60 MG PO CPEP
60.0000 mg | ORAL_CAPSULE | Freq: Every day | ORAL | 1 refills | Status: DC
  Filled 2022-07-07 – 2022-08-07 (×3): qty 30, 30d supply, fill #0
  Filled 2022-08-07 (×2): qty 30, 30d supply, fill #1

## 2022-07-07 MED ORDER — PROPRANOLOL HCL 10 MG PO TABS
10.0000 mg | ORAL_TABLET | Freq: Two times a day (BID) | ORAL | 2 refills | Status: DC | PRN
  Filled 2022-07-07: qty 60, 30d supply, fill #0

## 2022-07-07 NOTE — Progress Notes (Signed)
Lab results from our visit are all negative so I don't see evidence of an autoimmune problem causing her symptoms. I suspect she has at least some of the problems coming from fibromyalgia. I recommend a sleep study if she has never had one because of the fatigue and sleep disruption worsens pain symptoms. We can refer her if this has not been checked out.

## 2022-07-09 ENCOUNTER — Other Ambulatory Visit: Payer: Self-pay | Admitting: *Deleted

## 2022-07-09 ENCOUNTER — Ambulatory Visit (HOSPITAL_COMMUNITY): Payer: Commercial Managed Care - HMO | Admitting: Psychiatry

## 2022-07-09 DIAGNOSIS — G8929 Other chronic pain: Secondary | ICD-10-CM

## 2022-08-04 ENCOUNTER — Ambulatory Visit: Payer: Commercial Managed Care - HMO | Admitting: Internal Medicine

## 2022-08-07 ENCOUNTER — Other Ambulatory Visit (HOSPITAL_COMMUNITY): Payer: Self-pay

## 2022-08-07 ENCOUNTER — Other Ambulatory Visit (HOSPITAL_BASED_OUTPATIENT_CLINIC_OR_DEPARTMENT_OTHER): Payer: Self-pay

## 2022-08-08 ENCOUNTER — Other Ambulatory Visit (HOSPITAL_BASED_OUTPATIENT_CLINIC_OR_DEPARTMENT_OTHER): Payer: Self-pay

## 2022-08-28 NOTE — Progress Notes (Deleted)
Office Visit Note  Patient: Carolyn Miranda             Date of Birth: 1991-04-16           MRN: [id number]             PCP: Girtha Rm, NP-C Referring: Girtha Rm, NP-C Visit Date: 08/29/2022   Subjective:  No chief complaint on file.   History of Present Illness: Carolyn Miranda is a 32 y.o. female here for follow up ***   Previous HPI 07/02/22 [name] is a 32 y.o. female here for evaluation of positive ANA associated with chronic pain in multiple areas worst in back and hips discussed with PCP office and somewhat episodic. Reports family history of fibromyalgia.  She has had ongoing symptoms somewhat chronically but particularly notices problems being worse within the past 1 year.  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  Also experiencing increases in left hip and low back pain often this is the most painful area.  Does awaken sometimes at nighttime and has to reposition associated with the left hip pain.  Outside of the pedal edema she does not see a lot of visible joint swelling associated with the pain.  She is also noticed frequent sleep disturbances and has fatigue on a daily basis.  Has difficulty with concentration and short-term memory and tasks.  She has noticed mildly erythematous rashes coming and going transiently throughout frequently on her extremities these are itchy and resolved with no residual skin changes or hyperpigmentation.  She has not noticed ulcers or sores in the mouth or nose.  Does not describe frequent cervical axillary or inguinal lymphadenopathy.  She has occasionally had transient redness in her fingers but no typical Raynaud's symptoms.   Labs reviewed ANA 1:160 nucleolar RF neg ESR 5 CRP <1   No Rheumatology ROS completed.   PMFS History:  Patient Active  Problem List   Diagnosis Date Noted   Gastroesophageal reflux disease 06/04/2022   Social anxiety disorder 05/05/2022   GAD (generalized anxiety disorder) 05/05/2022   Antinuclear antibody (ANA) titer greater than 1:80 04/08/2022   Back pain 04/02/2022   Other chronic pain 04/02/2022   Pelvic pain 04/02/2022   Bilateral hand pain 04/02/2022   Seizures ([place]) 11/02/2019   Encounter for monitoring anticonvulsant therapy 11/01/2019   Allergic rhinitis 03/26/2018   Neck pain 12/27/2015   Encounter for birth control 08/24/2015   Asthma 04/20/2015   Adjustment disorder with mixed anxiety and depressed mood 01/16/2015   Obesity (BMI 30-39.9) 01/16/2015    Past Medical History:  Diagnosis Date   Allergies    Anxiety    Asthma    Depression    Migraines    Seizure ([place])    Urine incontinence     Family History  Problem Relation Age of Onset   Mental illness Mother    Drug abuse Mother        overmedicating rx by psych   Miscarriages / [place] Mother    Depression Mother    Arthritis Mother    Diabetes Father    Arthritis Maternal Grandmother    Asthma Maternal Grandmother    Past Surgical History:  Procedure Laterality Date   BREAST REDUCTION SURGERY Bilateral 07/08/2017   WISDOM TOOTH EXTRACTION  Social History   Social History Narrative   Not on file   Immunization History  Administered Date(s) Administered   PFIZER Comirnaty(Gray Top)Covid-19 Tri-Sucrose Vaccine 08/23/2020   PFIZER(Purple Top)SARS-COV-2 Vaccination 01/09/2020, 01/30/2020   PPD Test 03/01/2019     Objective: Vital Signs: There were no vitals taken for this visit.   Physical Exam   Musculoskeletal Exam: ***  CDAI Exam: CDAI Score: -- Patient Global: --; Provider Global: -- Swollen: --; Tender: -- Joint Exam 08/29/2022   No joint exam has been documented for this visit   There is currently no information documented on the homunculus. Go to the Rheumatology activity and complete  the homunculus joint exam.  Investigation: No additional findings.  Imaging: No results found.  Recent Labs: Lab Results  Component Value Date   WBC 5.2 04/02/2022   HGB 14.3 04/02/2022   PLT 258.0 04/02/2022   NA 137 04/02/2022   K 3.8 04/02/2022   CL 109 04/02/2022   CO2 20 04/02/2022   GLUCOSE 82 04/02/2022   BUN 10 04/02/2022   CREATININE 0.58 04/02/2022   BILITOT 0.5 04/02/2022   ALKPHOS 59 04/02/2022   AST 17 04/02/2022   ALT 11 04/02/2022   PROT 6.8 04/02/2022   ALBUMIN 4.1 04/02/2022   CALCIUM 8.9 04/02/2022   GFRAA >60 08/01/2019    Speciality Comments: No specialty comments available.  Procedures:  No procedures performed Allergies: Bupropion, Lamotrigine, Celexa [citalopram hydrobromide], and Other   Assessment / Plan:     Visit Diagnoses: No diagnosis found.  ***  Orders: No orders of the defined types were placed in this encounter.  No orders of the defined types were placed in this encounter.    Follow-Up Instructions: No follow-ups on file.   Collier Salina, MD  Note - This record has been created using [employer].  Chart creation errors have been sought, but may not always  have been located. Such creation errors do not reflect on  the standard of medical care.

## 2022-08-29 ENCOUNTER — Ambulatory Visit: Payer: Medicaid Other | Admitting: Internal Medicine

## 2022-09-03 ENCOUNTER — Institutional Professional Consult (permissible substitution): Payer: Commercial Managed Care - HMO | Admitting: Neurology

## 2022-09-03 ENCOUNTER — Ambulatory Visit (HOSPITAL_COMMUNITY): Payer: Commercial Managed Care - HMO | Admitting: Psychiatry

## 2022-09-10 ENCOUNTER — Other Ambulatory Visit (HOSPITAL_COMMUNITY): Payer: Self-pay

## 2022-09-10 ENCOUNTER — Other Ambulatory Visit (HOSPITAL_COMMUNITY): Payer: Self-pay | Admitting: Psychiatry

## 2022-09-10 ENCOUNTER — Ambulatory Visit (HOSPITAL_COMMUNITY): Payer: Commercial Managed Care - HMO | Admitting: Psychiatry

## 2022-09-10 ENCOUNTER — Encounter: Payer: Self-pay | Admitting: Internal Medicine

## 2022-09-10 DIAGNOSIS — F4010 Social phobia, unspecified: Secondary | ICD-10-CM

## 2022-09-10 DIAGNOSIS — F411 Generalized anxiety disorder: Secondary | ICD-10-CM

## 2022-09-10 MED ORDER — PROPRANOLOL HCL 10 MG PO TABS
10.0000 mg | ORAL_TABLET | Freq: Two times a day (BID) | ORAL | 2 refills | Status: DC | PRN
  Filled 2022-09-10 (×2): qty 60, 30d supply, fill #0

## 2022-09-10 MED ORDER — DULOXETINE HCL 60 MG PO CPEP
60.0000 mg | ORAL_CAPSULE | Freq: Every day | ORAL | 1 refills | Status: DC
  Filled 2022-09-10 (×2): qty 30, 30d supply, fill #0
  Filled 2022-10-14: qty 30, 30d supply, fill #1

## 2022-09-11 ENCOUNTER — Other Ambulatory Visit (HOSPITAL_COMMUNITY): Payer: Self-pay

## 2022-09-11 ENCOUNTER — Other Ambulatory Visit: Payer: Self-pay

## 2022-09-11 ENCOUNTER — Other Ambulatory Visit: Payer: Self-pay | Admitting: Family Medicine

## 2022-09-11 MED ORDER — TOPIRAMATE 100 MG PO TABS
200.0000 mg | ORAL_TABLET | Freq: Every evening | ORAL | 0 refills | Status: DC
  Filled 2022-09-11 (×2): qty 60, 30d supply, fill #0

## 2022-09-16 ENCOUNTER — Other Ambulatory Visit (HOSPITAL_COMMUNITY): Payer: Self-pay

## 2022-09-29 NOTE — Progress Notes (Deleted)
Office Visit Note  Patient: Carolyn Miranda             Date of Birth: [birth year]           MRN: [id number]             PCP: Girtha Rm, NP-C Referring: Girtha Rm, NP-C Visit Date: 09/30/2022   Subjective:  No chief complaint on file.   History of Present Illness: Carolyn Miranda is a 32 y.o. female here for follow up ***   Previous HPI 07/02/22 Carolyn Miranda is a 32 y.o. female here for evaluation of positive ANA associated with chronic pain in multiple areas worst in back and hips discussed with PCP office and somewhat episodic. Reports family history of fibromyalgia.  She has had ongoing symptoms somewhat chronically but particularly notices problems being worse within the past 1 year.  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  Also experiencing increases in left hip and low back pain often this is the most painful area.  Does awaken sometimes at nighttime and has to reposition associated with the left hip pain.  Outside of the pedal edema she does not see a lot of visible joint swelling associated with the pain.  She is also noticed frequent sleep disturbances and has fatigue on a daily basis.  Has difficulty with concentration and short-term memory and tasks.  She has noticed mildly erythematous rashes coming and going transiently throughout frequently on her extremities these are itchy and resolved with no residual skin changes or hyperpigmentation.  She has not noticed ulcers or sores in the mouth or nose.  Does not describe frequent cervical axillary or inguinal lymphadenopathy.  She has occasionally had transient redness in her fingers but no typical Raynaud's symptoms.   Labs reviewed ANA 1:160 nucleolar RF neg ESR 5 CRP <1   No Rheumatology ROS completed.   PMFS History:  Patient Active  Problem List   Diagnosis Date Noted   Gastroesophageal reflux disease 06/04/2022   Social anxiety disorder 05/05/2022   GAD (generalized anxiety disorder) 05/05/2022   Antinuclear antibody (ANA) titer greater than 1:80 04/08/2022   Back pain 04/02/2022   Other chronic pain 04/02/2022   Pelvic pain 04/02/2022   Bilateral hand pain 04/02/2022   Seizures ([place]) 11/02/2019   Encounter for monitoring anticonvulsant therapy 11/01/2019   Allergic rhinitis 03/26/2018   Neck pain 12/27/2015   Encounter for birth control 08/24/2015   Asthma 04/20/2015   Adjustment disorder with mixed anxiety and depressed mood 01/16/2015   Obesity (BMI 30-39.9) 01/16/2015    Past Medical History:  Diagnosis Date   Allergies    Anxiety    Asthma    Depression    Migraines    Seizure ([place])    Urine incontinence     Family History  Problem Relation Age of Onset   Mental illness Mother    Drug abuse Mother        overmedicating rx by psych   Miscarriages / [place] Mother    Depression Mother    Arthritis Mother    Diabetes Father    Arthritis Maternal Grandmother    Asthma Maternal Grandmother    Past Surgical History:  Procedure Laterality Date   BREAST REDUCTION SURGERY Bilateral 07/08/2017   WISDOM TOOTH EXTRACTION  Social History   Social History Narrative   Not on file   Immunization History  Administered Date(s) Administered   PFIZER Comirnaty(Gray Top)Covid-19 Tri-Sucrose Vaccine 08/23/2020   PFIZER(Purple Top)SARS-COV-2 Vaccination 01/09/2020, 01/30/2020   PPD Test 03/01/2019     Objective: Vital Signs: There were no vitals taken for this visit.   Physical Exam   Musculoskeletal Exam: ***  CDAI Exam: CDAI Score: -- Patient Global: --; Provider Global: -- Swollen: --; Tender: -- Joint Exam 09/30/2022   No joint exam has been documented for this visit   There is currently no information documented on the homunculus. Go to the Rheumatology activity and complete  the homunculus joint exam.  Investigation: No additional findings.  Imaging: No results found.  Recent Labs: Lab Results  Component Value Date   WBC 5.2 04/02/2022   HGB 14.3 04/02/2022   PLT 258.0 04/02/2022   NA 137 04/02/2022   K 3.8 04/02/2022   CL 109 04/02/2022   CO2 20 04/02/2022   GLUCOSE 82 04/02/2022   BUN 10 04/02/2022   CREATININE 0.58 04/02/2022   BILITOT 0.5 04/02/2022   ALKPHOS 59 04/02/2022   AST 17 04/02/2022   ALT 11 04/02/2022   PROT 6.8 04/02/2022   ALBUMIN 4.1 04/02/2022   CALCIUM 8.9 04/02/2022   GFRAA >60 08/01/2019    Speciality Comments: No specialty comments available.  Procedures:  No procedures performed Allergies: Bupropion, Lamotrigine, Celexa [citalopram hydrobromide], and Other   Assessment / Plan:     Visit Diagnoses: No diagnosis found.  ***  Orders: No orders of the defined types were placed in this encounter.  No orders of the defined types were placed in this encounter.    Follow-Up Instructions: No follow-ups on file.   Collier Salina, MD  Note - This record has been created using [employer].  Chart creation errors have been sought, but may not always  have been located. Such creation errors do not reflect on  the standard of medical care.

## 2022-09-30 ENCOUNTER — Ambulatory Visit: Payer: Commercial Managed Care - HMO | Admitting: Internal Medicine

## 2022-10-05 NOTE — Progress Notes (Signed)
Office Visit Note  Patient: Carolyn Miranda             Date of Birth: 01/04/1991           MRN: [id number]             PCP: Girtha Rm, NP-C Referring: Girtha Rm, NP-C Visit Date: 10/06/2022   Subjective:  Follow-up   History of Present Illness: Carolyn Miranda is a 32 y.o. female here for follow up here for fibromyalgia and chronic pain with positive ANA.  Since her last visit she has felt some benefit for anxiety with the Cymbalta but has not seen a big improvement in her depression or chronic pain.  Struggling a bit psychologically she has been ill also her grandmother passed away and feels her depression is overall not well-controlled.  She been recommended for sleep study both although going on needs to reschedule this and get checked out.  She reports waking up 2 or 3 times nightly on most nights.  Previous HPI 07/02/22 Carolyn Miranda is a 32 y.o. female here for evaluation of positive ANA associated with chronic pain in multiple areas worst in back and hips discussed with PCP office and somewhat episodic. Reports family history of fibromyalgia.  She has had ongoing symptoms somewhat chronically but particularly notices problems being worse within the past 1 year.  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  Also experiencing increases in left hip and low back pain often this is the most painful area.  Does awaken sometimes at nighttime and has to reposition associated with the left hip pain.  Outside of the pedal edema she does not see a lot of visible joint swelling associated with the pain.  She is also noticed frequent sleep disturbances and has fatigue on a daily basis.  Has difficulty with concentration and short-term memory and tasks.  She has noticed mildly erythematous rashes coming and going  transiently throughout frequently on her extremities these are itchy and resolved with no residual skin changes or hyperpigmentation.  She has not noticed ulcers or sores in the mouth or nose.  Does not describe frequent cervical axillary or inguinal lymphadenopathy.  She has occasionally had transient redness in her fingers but no typical Raynaud's symptoms.   Labs reviewed ANA 1:160 nucleolar RF neg ESR 5 CRP <1   Review of Systems  Constitutional:  Positive for fatigue.  HENT:  Positive for mouth dryness. Negative for mouth sores.   Eyes:  Positive for dryness.  Respiratory:  Positive for shortness of breath.   Cardiovascular:  Negative for chest pain and palpitations.  Gastrointestinal:  Positive for constipation and diarrhea. Negative for blood in stool.  Endocrine: Negative for increased urination.  Genitourinary:  Negative for involuntary urination.  Musculoskeletal:  Positive for joint pain, gait problem, joint pain, joint swelling, myalgias, muscle weakness, morning stiffness, muscle tenderness and myalgias.  Skin:  Negative for color change, rash, hair loss and sensitivity to sunlight.  Allergic/Immunologic: Negative for susceptible to infections.  Neurological:  Positive for headaches. Negative for dizziness.  Hematological:  Negative for swollen glands.  Psychiatric/Behavioral:  Positive for depressed mood and sleep disturbance. The patient is nervous/anxious.     PMFS History:  Patient Active Problem List   Diagnosis Date Noted   Myofascial pain 10/06/2022  Gastroesophageal reflux disease 06/04/2022   Social anxiety disorder 05/05/2022   GAD (generalized anxiety disorder) 05/05/2022   Antinuclear antibody (ANA) titer greater than 1:80 04/08/2022   Back pain 04/02/2022   Fibromyalgia 04/02/2022   Pelvic pain 04/02/2022   Bilateral hand pain 04/02/2022   Seizures ([place]) 11/02/2019   Encounter for monitoring anticonvulsant therapy 11/01/2019   Allergic rhinitis  03/26/2018   Neck pain 12/27/2015   Encounter for birth control 08/24/2015   Asthma 04/20/2015   Adjustment disorder with mixed anxiety and depressed mood 01/16/2015   Obesity (BMI 30-39.9) 01/16/2015    Past Medical History:  Diagnosis Date   Allergies    Anxiety    Asthma    Depression    Migraines    Seizure ([place])    Urine incontinence     Family History  Problem Relation Age of Onset   Mental illness Mother    Drug abuse Mother        overmedicating rx by psych   Miscarriages / [place] Mother    Depression Mother    Arthritis Mother    Diabetes Father    Arthritis Maternal Grandmother    Asthma Maternal Grandmother    Past Surgical History:  Procedure Laterality Date   BREAST REDUCTION SURGERY Bilateral 07/08/2017   WISDOM TOOTH EXTRACTION     Social History   Social History Narrative   Not on file   Immunization History  Administered Date(s) Administered   PFIZER Comirnaty(Gray Top)Covid-19 Tri-Sucrose Vaccine 08/23/2020   PFIZER(Purple Top)SARS-COV-2 Vaccination 01/09/2020, 01/30/2020   PPD Test 03/01/2019     Objective: Vital Signs: BP 105/70 (BP Location: Left Arm, Patient Position: Sitting, Cuff Size: Normal)   Pulse 64   Resp 12   Ht 5\' 4"  (1.626 m)   Wt 224 lb (101.6 kg)   BMI 38.45 kg/m    Physical Exam Constitutional:      Appearance: She is obese.  Cardiovascular:     Rate and Rhythm: Normal rate and regular rhythm.  Pulmonary:     Effort: Pulmonary effort is normal.     Breath sounds: Normal breath sounds.  Skin:    General: Skin is warm and dry.  Neurological:     Mental Status: She is alert.      Musculoskeletal Exam: Shoulders full ROM no tenderness or swelling Elbows full ROM no tenderness or swelling Wrists full ROM no tenderness or swelling Fingers full ROM no tenderness or swelling Mild diffuse back tenderness to pressure in upper back and shoulders, Low back midline and bilateral muscle pain, worse on left L>R  lateral hip tenderness to pressure, full ROM intact Knees full ROM no tenderness or swelling  Investigation: No additional findings.  Imaging: No results found.  Recent Labs: Lab Results  Component Value Date   WBC 5.2 04/02/2022   HGB 14.3 04/02/2022   PLT 258.0 04/02/2022   NA 137 04/02/2022   K 3.8 04/02/2022   CL 109 04/02/2022   CO2 20 04/02/2022   GLUCOSE 82 04/02/2022   BUN 10 04/02/2022   CREATININE 0.58 04/02/2022   BILITOT 0.5 04/02/2022   ALKPHOS 59 04/02/2022   AST 17 04/02/2022   ALT 11 04/02/2022   PROT 6.8 04/02/2022   ALBUMIN 4.1 04/02/2022   CALCIUM 8.9 04/02/2022   GFRAA >60 08/01/2019    Speciality Comments: No specialty comments available.  Procedures:  No procedures performed Allergies: Bupropion, Lamotrigine, Celexa [citalopram hydrobromide], and Other   Assessment / Plan:  Visit Diagnoses: Fibromyalgia - Plan: Ambulatory referral to Physical Medicine Rehab, cyclobenzaprine (FLEXERIL) 5 MG tablet  Discussed I think symptoms are primarily related to her chronic pain syndrome probably under exacerbation due to severe sleep disturbances and her depressed mood.  Not getting as much benefit as would like to see since she started taking the Cymbalta.  However I recommend getting further evaluation with PM&R referral about some other interventions for her myofascial pain since I do not really want to adjust any other treatment overlapping with her depression management plan at this time.  Sent new Rx for the Flexeril 5 mg try taking this at night to see if it helps decrease the sleep fragmentation.  Antinuclear antibody (ANA) titer greater than 1:80  Still no specific clinical criteria or inflammatory joint symptoms noted on exam today.  Orders: Orders Placed This Encounter  Procedures   Ambulatory referral to Physical Medicine Rehab   Meds ordered this encounter  Medications   cyclobenzaprine (FLEXERIL) 5 MG tablet    Sig: Take 1 tablet (5 mg  total) by mouth at bedtime as needed for muscle spasms.    Dispense:  30 tablet    Refill:  1     Follow-Up Instructions: Return in about 3 months (around 01/06/2023), or if symptoms worsen or fail to improve, for FMS/myofascial pain f/u 66mos PRN.   Collier Salina, MD  Note - This record has been created using [employer].  Chart creation errors have been sought, but may not always  have been located. Such creation errors do not reflect on  the standard of medical care.

## 2022-10-06 ENCOUNTER — Ambulatory Visit: Payer: Medicaid Other | Attending: Internal Medicine | Admitting: Internal Medicine

## 2022-10-06 ENCOUNTER — Encounter: Payer: Self-pay | Admitting: Internal Medicine

## 2022-10-06 ENCOUNTER — Other Ambulatory Visit (HOSPITAL_COMMUNITY): Payer: Self-pay

## 2022-10-06 VITALS — BP 105/70 | HR 64 | Resp 12 | Ht 64.0 in | Wt 224.0 lb

## 2022-10-06 DIAGNOSIS — R760 Raised antibody titer: Secondary | ICD-10-CM

## 2022-10-06 DIAGNOSIS — M797 Fibromyalgia: Secondary | ICD-10-CM

## 2022-10-06 DIAGNOSIS — M7918 Myalgia, other site: Secondary | ICD-10-CM | POA: Insufficient documentation

## 2022-10-06 MED ORDER — CYCLOBENZAPRINE HCL 5 MG PO TABS
5.0000 mg | ORAL_TABLET | Freq: Every evening | ORAL | 1 refills | Status: DC | PRN
  Filled 2022-10-06 – 2022-10-07 (×2): qty 30, 30d supply, fill #0

## 2022-10-06 NOTE — Patient Instructions (Signed)
I recommend checking out the [employer] patient-centered guide for fibromyalgia and chronic pain management: fibroguide.med.[facility].edu   Myofascial Pain Syndrome and Fibromyalgia Myofascial pain syndrome and fibromyalgia are both pain disorders. You may feel this pain mainly in your muscles. Myofascial pain syndrome: Always has tender points in the muscles that will cause pain when pressed (trigger points). The pain may come and go. Usually affects your neck, upper back, and shoulder areas. The pain often moves into your arms and hands. Fibromyalgia: Has muscle pains and tenderness that come and go. Is often associated with tiredness (fatigue) and sleep problems. Has trigger points. Tends to be long-lasting (chronic), but is not life-threatening. Fibromyalgia and myofascial pain syndrome are not the same. However, they often occur together. If you have both conditions, each can make the other worse. Both are common and can cause enough pain and fatigue to make day-to-day activities difficult. Both can be hard to diagnose because their symptoms are common in many other conditions. What are the causes? The exact causes of these conditions are not known. What increases the risk? You are more likely to develop either of these conditions if: You have a family history of the condition. You are female. You have certain triggers, such as: Spine disorders. An injury (trauma) or other physical stressors. Being under a lot of stress. Medical conditions such as osteoarthritis, rheumatoid arthritis, or lupus. What are the signs or symptoms? Fibromyalgia The main symptom of fibromyalgia is widespread pain and tenderness in your muscles. Pain is sometimes described as stabbing, shooting, or burning. You may also have: Tingling or numbness. Sleep problems and fatigue. Problems with attention and concentration (fibro fog). Other symptoms may include: Bowel and bladder  problems. Headaches. Vision problems. Sensitivity to odors and noises. Depression or mood changes. Painful menstrual periods (dysmenorrhea). Dry skin or eyes. These symptoms can vary over time. Myofascial pain syndrome Symptoms of myofascial pain syndrome include: Tight, ropy bands of muscle. Uncomfortable sensations in muscle areas. These may include aching, cramping, burning, numbness, tingling, and weakness. Difficulty moving certain parts of the body freely (poor range of motion). How is this diagnosed? This condition may be diagnosed by your symptoms and medical history. You will also have a physical exam. In general: Fibromyalgia is diagnosed if you have pain, fatigue, and other symptoms for more than 3 months, and symptoms cannot be explained by another condition. Myofascial pain syndrome is diagnosed if you have trigger points in your muscles, and those trigger points are tender and cause pain elsewhere in your body (referred pain). How is this treated? Treatment for these conditions depends on the type that you have. For fibromyalgia, a healthy lifestyle is the most important treatment including aerobic and strength exercises. Different types of medicines are used to help treat pain and include: NSAIDs. Medicines for treating depression. Medicines that help control seizures. Medicines that relax the muscles. Treatment for myofascial pain syndrome includes: Pain medicines, such as NSAIDs. Cooling and stretching of muscles. Massage therapy with myofascial release technique. Trigger point injections. Treating these conditions often requires a team of health care providers. These may include: Your primary care provider. A physical therapist. Complementary health care providers, such as massage therapists or acupuncturists. A psychiatrist for cognitive behavioral therapy. Follow these instructions at home: Medicines Take over-the-counter and prescription medicines only as told  by your health care provider. Ask your health care provider if the medicine prescribed to you: Requires you to avoid driving or using machinery. Can cause constipation. You   may need to take these actions to prevent or treat constipation: Drink enough fluid to keep your urine pale yellow. Take over-the-counter or prescription medicines. Eat foods that are high in fiber, such as beans, whole grains, and fresh fruits and vegetables. Limit foods that are high in fat and processed sugars, such as fried or sweet foods. Lifestyle  Do exercises as told by your health care provider or physical therapist. Practice relaxation techniques to control your stress. You may want to try: Biofeedback. Visual imagery. Hypnosis. Muscle relaxation. Yoga. Meditation. Maintain a healthy lifestyle. This includes eating a healthy diet and getting enough sleep. Do not use any products that contain nicotine or tobacco. These products include cigarettes, chewing tobacco, and vaping devices, such as e-cigarettes. If you need help quitting, ask your health care provider. General instructions Talk to your health care provider about complementary treatments, such as acupuncture or massage. Do not do activities that stress or strain your muscles. This includes repetitive motions and heavy lifting. Keep all follow-up visits. This is important. Where to find support Consider joining a support group with others who are diagnosed with this condition. National Fibromyalgia Association: fmaware.org Where to find more information U.S. Pain Foundation: uspainfoundation.org Contact a health care provider if: You have new symptoms. Your symptoms get worse or your pain is severe. You have side effects from your medicines. You have trouble sleeping. Your condition is causing depression or anxiety. Get help right away if: You have thoughts of hurting yourself or others. Get help right away if you feel like you may hurt  yourself or others, or have thoughts about taking your own life. Go to your nearest emergency room or: Call 911. Call the National Suicide Prevention Lifeline at 1-800-273-8255 or 988. This is open 24 hours a day. Text the Crisis Text Line at 741741. This information is not intended to replace advice given to you by your health care provider. Make sure you discuss any questions you have with your health care provider. Document Revised: 04/21/2022 Document Reviewed: 06/14/2021 Elsevier Patient Education  2023 Elsevier Inc.  

## 2022-10-07 ENCOUNTER — Other Ambulatory Visit (HOSPITAL_COMMUNITY): Payer: Self-pay

## 2022-10-08 ENCOUNTER — Other Ambulatory Visit (HOSPITAL_COMMUNITY): Payer: Self-pay

## 2022-10-14 ENCOUNTER — Other Ambulatory Visit (HOSPITAL_COMMUNITY): Payer: Self-pay

## 2022-10-15 ENCOUNTER — Ambulatory Visit (HOSPITAL_COMMUNITY): Payer: Commercial Managed Care - HMO | Admitting: Psychiatry

## 2022-10-28 ENCOUNTER — Encounter: Payer: Self-pay | Admitting: Family Medicine

## 2022-11-06 ENCOUNTER — Ambulatory Visit: Payer: Medicaid Other | Admitting: Family Medicine

## 2022-11-07 ENCOUNTER — Encounter: Payer: Self-pay | Admitting: Physical Medicine & Rehabilitation

## 2022-11-12 ENCOUNTER — Other Ambulatory Visit (HOSPITAL_COMMUNITY): Payer: Self-pay

## 2022-11-12 ENCOUNTER — Other Ambulatory Visit (HOSPITAL_COMMUNITY): Payer: Self-pay | Admitting: *Deleted

## 2022-11-12 DIAGNOSIS — F4010 Social phobia, unspecified: Secondary | ICD-10-CM

## 2022-11-12 DIAGNOSIS — F411 Generalized anxiety disorder: Secondary | ICD-10-CM

## 2022-11-12 MED ORDER — DULOXETINE HCL 60 MG PO CPEP
60.0000 mg | ORAL_CAPSULE | Freq: Every day | ORAL | 0 refills | Status: DC
  Filled 2022-11-12: qty 15, 15d supply, fill #0

## 2022-11-17 ENCOUNTER — Other Ambulatory Visit (HOSPITAL_COMMUNITY): Payer: Self-pay

## 2022-11-17 MED ORDER — TOPIRAMATE 100 MG PO TABS
200.0000 mg | ORAL_TABLET | Freq: Every evening | ORAL | 3 refills | Status: DC
  Filled 2022-11-17: qty 180, 90d supply, fill #0
  Filled 2023-03-12: qty 180, 90d supply, fill #1
  Filled 2023-07-01: qty 180, 90d supply, fill #2
  Filled 2023-10-15: qty 180, 90d supply, fill #3

## 2022-11-18 ENCOUNTER — Ambulatory Visit: Payer: Medicaid Other | Admitting: Family Medicine

## 2022-11-21 ENCOUNTER — Other Ambulatory Visit (HOSPITAL_COMMUNITY): Payer: Self-pay

## 2022-11-21 ENCOUNTER — Ambulatory Visit (INDEPENDENT_AMBULATORY_CARE_PROVIDER_SITE_OTHER): Payer: Medicaid Other | Admitting: Family Medicine

## 2022-11-21 ENCOUNTER — Encounter: Payer: Self-pay | Admitting: Family Medicine

## 2022-11-21 VITALS — BP 110/70 | HR 70 | Temp 97.6°F | Ht 64.0 in | Wt 227.0 lb

## 2022-11-21 DIAGNOSIS — R110 Nausea: Secondary | ICD-10-CM | POA: Diagnosis not present

## 2022-11-21 DIAGNOSIS — K219 Gastro-esophageal reflux disease without esophagitis: Secondary | ICD-10-CM

## 2022-11-21 MED ORDER — PANTOPRAZOLE SODIUM 40 MG PO TBEC
40.0000 mg | DELAYED_RELEASE_TABLET | Freq: Every day | ORAL | 1 refills | Status: DC
  Filled 2022-11-21 – 2022-12-11 (×2): qty 30, 30d supply, fill #0

## 2022-11-21 NOTE — Progress Notes (Unsigned)
Subjective:     Patient ID: Carolyn Miranda, female    DOB: 06-21-1991, 32 y.o.   MRN: [id number]  Chief Complaint  Patient presents with   Gastroesophageal Reflux    Happening more than usual, OTC is not working ([facility] version of Prilosec)     HPI Patient is in today for worsening acid reflux and GERD.  Nausea in the mornings.   Increased stress.  States her appetite is poor and she sometimes takes gummies or smokes marijuana to help her eat.   Health Maintenance Due  Topic Date Due   DTaP/Tdap/Td (1 - Tdap) Never done   PAP SMEAR-Modifier  09/12/2015    Past Medical History:  Diagnosis Date   Allergies    Anxiety    Asthma    Depression    Migraines    Seizure (HCC)    Urine incontinence     Past Surgical History:  Procedure Laterality Date   BREAST REDUCTION SURGERY Bilateral 07/08/2017   WISDOM TOOTH EXTRACTION      Family History  Problem Relation Age of Onset   Mental illness Mother    Drug abuse Mother        overmedicating rx by psych   Miscarriages / [place] Mother    Depression Mother    Arthritis Mother    Diabetes Father    Arthritis Maternal Grandmother    Asthma Maternal Grandmother     Social History   Socioeconomic History   Marital status: Single    Spouse name: Not on file   Number of children: Not on file   Years of education: Not on file   Highest education level: Not on file  Occupational History   Not on file  Tobacco Use   Smoking status: Never    Passive exposure: Current   Smokeless tobacco: Never  Vaping Use   Vaping Use: Never used  Substance and Sexual Activity   Alcohol use: Yes    Comment: socially   Drug use: No   Sexual activity: Not on file  Other Topics Concern   Not on file  Social History Narrative   Not on file   Social Determinants of Health   Financial Resource Strain: Not on file  Food Insecurity: Not on file  Transportation Needs: Not on file  Physical Activity: Not on file   Stress: Not on file  Social Connections: Not on file  Intimate Partner Violence: Not on file    Outpatient Medications Prior to Visit  Medication Sig Dispense Refill   albuterol (PROVENTIL HFA;VENTOLIN HFA) 108 (90 Base) MCG/ACT inhaler Inhale 2 puffs into the lungs every 6 (six) hours as needed for wheezing or shortness of breath. 1 Inhaler 1   albuterol (VENTOLIN HFA) 108 (90 Base) MCG/ACT inhaler Inhale 1 puff into the lungs every 4 (four) hours. 18 g 3   clindamycin (CLINDAGEL) 1 % gel APPLY A THIN LAYER TO THE AFFECTED AREA(S) BY TOPICAL ROUTE 2 TIMES PER DAY 30 g 3   cyclobenzaprine (FLEXERIL) 5 MG tablet Take 1 tablet (5 mg total) by mouth at bedtime as needed for muscle spasms. 30 tablet 1   DULoxetine (CYMBALTA) 60 MG capsule Take 1 capsule (60 mg total) by mouth daily. 15 capsule 0   fluticasone (FLONASE) 50 MCG/ACT nasal spray Place 2 sprays into both nostrils daily. 16 g 6   propranolol (INDERAL) 10 MG tablet Take 1 tablet (10 mg total) by mouth 2 (two) times daily as needed (anxiety). 60  tablet 2   topiramate (TOPAMAX) 100 MG tablet Take 2 tablets (200 mg total) by mouth daily for seizure control 180 tablet 0   topiramate (TOPAMAX) 100 MG tablet Take 2 tablets by mouth every evening. 60 tablet 0   topiramate (TOPAMAX) 100 MG tablet Take 2 tablets (200 mg total) by mouth daily at night. 180 tablet 3   No facility-administered medications prior to visit.    Allergies  Allergen Reactions   Bupropion Nausea And Vomiting and Other (See Comments)   Lamotrigine Rash    Rash on left side of face Rash on left side of face Rash on left side of face Rash on left side of face    Celexa [Citalopram Hydrobromide] Diarrhea and Nausea And Vomiting   Other     Dairy     Review of Systems  Constitutional:  Negative for chills, fever and weight loss.  Respiratory:  Negative for shortness of breath.   Cardiovascular:  Negative for chest pain, palpitations and leg swelling.   Gastrointestinal:  Positive for heartburn and nausea. Negative for abdominal pain, constipation, diarrhea and vomiting.  Genitourinary:  Negative for dysuria, frequency and urgency.  Neurological:  Negative for dizziness.       Objective:    Physical Exam Constitutional:      General: She is not in acute distress.    Appearance: She is not ill-appearing.  HENT:     Mouth/Throat:     Mouth: Mucous membranes are moist.  Eyes:     Extraocular Movements: Extraocular movements intact.     Conjunctiva/sclera: Conjunctivae normal.  Cardiovascular:     Rate and Rhythm: Normal rate and regular rhythm.  Pulmonary:     Effort: Pulmonary effort is normal.     Breath sounds: Normal breath sounds.  Abdominal:     General: Bowel sounds are normal. There is no distension.     Palpations: Abdomen is soft.     Tenderness: There is no abdominal tenderness. There is no right CVA tenderness, guarding or rebound.  Musculoskeletal:     Cervical back: Normal range of motion and neck supple.  Skin:    General: Skin is warm and dry.  Neurological:     General: No focal deficit present.     Mental Status: She is alert and oriented to person, place, and time.  Psychiatric:        Mood and Affect: Mood normal.        Behavior: Behavior normal.        Thought Content: Thought content normal.     BP 110/70 (BP Location: Left Arm, Patient Position: Sitting, Cuff Size: Large)   Pulse 70   Temp 97.6 F (36.4 C) (Temporal)   Ht 5\' 4"  (1.626 m)   Wt 227 lb (103 kg)   SpO2 97%   BMI 38.96 kg/m  Wt Readings from Last 3 Encounters:  11/21/22 227 lb (103 kg)  10/06/22 224 lb (101.6 kg)  07/02/22 216 lb 6.4 oz (98.2 kg)       Assessment & Plan:   Problem List Items Addressed This Visit       Digestive   Gastroesophageal reflux disease - Primary   Relevant Medications   pantoprazole (PROTONIX) 40 MG tablet   Other Visit Diagnoses     Nausea       Relevant Medications   pantoprazole  (PROTONIX) 40 MG tablet      No red flag symptoms. No weight loss.  Start Protonix daily.  Avoid food triggers.  GERD diet handout provided.  Follow up in 4 wks  I am having Carolyn Miranda start on pantoprazole. I am also having her maintain her albuterol, fluticasone, albuterol, clindamycin, topiramate, propranolol, topiramate, cyclobenzaprine, DULoxetine, and topiramate.  Meds ordered this encounter  Medications   pantoprazole (PROTONIX) 40 MG tablet    Sig: Take 1 tablet (40 mg total) by mouth daily.    Dispense:  30 tablet    Refill:  1    Order Specific Question:   Supervising Provider    Answer:   Hillard Danker A [[id number]]

## 2022-11-26 ENCOUNTER — Other Ambulatory Visit (HOSPITAL_COMMUNITY): Payer: Self-pay

## 2022-11-26 ENCOUNTER — Encounter (HOSPITAL_COMMUNITY): Payer: Self-pay | Admitting: Psychiatry

## 2022-11-26 ENCOUNTER — Ambulatory Visit (HOSPITAL_BASED_OUTPATIENT_CLINIC_OR_DEPARTMENT_OTHER): Payer: Medicaid Other | Admitting: Psychiatry

## 2022-11-26 VITALS — BP 121/83 | HR 73 | Ht 64.0 in | Wt 222.0 lb

## 2022-11-26 DIAGNOSIS — F4010 Social phobia, unspecified: Secondary | ICD-10-CM

## 2022-11-26 DIAGNOSIS — F411 Generalized anxiety disorder: Secondary | ICD-10-CM | POA: Diagnosis not present

## 2022-11-26 MED ORDER — PROPRANOLOL HCL 10 MG PO TABS
10.0000 mg | ORAL_TABLET | Freq: Three times a day (TID) | ORAL | 2 refills | Status: DC | PRN
  Filled 2022-11-26 – 2022-12-11 (×2): qty 90, 30d supply, fill #0
  Filled 2023-02-02: qty 90, 30d supply, fill #1
  Filled 2023-04-15: qty 90, 30d supply, fill #2

## 2022-11-26 MED ORDER — DULOXETINE HCL 60 MG PO CPEP
60.0000 mg | ORAL_CAPSULE | Freq: Two times a day (BID) | ORAL | 2 refills | Status: DC
  Filled 2022-11-26 – 2022-12-11 (×2): qty 60, 30d supply, fill #0
  Filled 2023-02-09: qty 60, 30d supply, fill #1
  Filled 2023-04-15: qty 60, 30d supply, fill #2

## 2022-11-26 MED ORDER — DULOXETINE HCL 30 MG PO CPEP
30.0000 mg | ORAL_CAPSULE | Freq: Every day | ORAL | 0 refills | Status: DC
  Filled 2022-11-26 – 2022-12-11 (×2): qty 14, 14d supply, fill #0

## 2022-11-26 NOTE — Progress Notes (Signed)
BH MD/PA/NP OP Progress Note  11/26/2022 9:36 AM Carolyn Miranda  MRN:  [id number]  Visit Diagnosis:    ICD-10-CM   1. Social anxiety disorder  F40.10 propranolol (INDERAL) 10 MG tablet    DULoxetine (CYMBALTA) 60 MG capsule    DULoxetine (CYMBALTA) 30 MG capsule    2. GAD (generalized anxiety disorder)  F41.1 propranolol (INDERAL) 10 MG tablet    DULoxetine (CYMBALTA) 60 MG capsule    DULoxetine (CYMBALTA) 30 MG capsule      Assessment: Carolyn Miranda is a 32 y.o. y.o. female with a history of anxiety, a seizure disorder, and chronic pain who presented to [facility] at [facility] for initial evaluation on 05/05/22.    During initial evaluation patient reported symptoms of anxiety including constant worry, racing thoughts, ruminations on past interactions, feeling overwhelmed, and impending dread for social interactions.  Patient endorsed experiencing restlessness secondary to her anxiety.  Patient denied any history of mania, psychosis, paranoia, SI/HI, or delusions.  Patient questioned diagnosis of ADHD and reported having done well in both school growing up and work more recently.  Of note patient was diagnosed with a seizure disorder in 2021 and there is concern of a possible autoimmune condition due to her elevated ANA.  She is scheduled with a rheumatologist for this and her chronic pain in December.  Patient met criteria for diagnosis of generalized anxiety disorder and social anxiety disorder and would benefit from medication management and connection with therapy.  While diagnosis of ADHD cannot be ruled out it does seem less likely as potential symptoms are more likely to be related to her anxiety.  Carolyn Miranda presents for follow-up evaluation. Today, 11/26/22, patient reports increased depression and irritability over the past couple months since the passing of her grandmother.  The decline in symptoms does appear related to the increased psychosocial  stressors.  We will titrate Cymbalta to 60 mg daily and 30 mg in the evening for 14 days before increasing to 60 mg twice a day.  We also increase propranolol to 3 times a day.  Risk and benefits of both were discussed denied any adverse side effects from medications at this time.  We will also patient with therapy referral.  Plan: - Increase Cymbalta to 60 mg QD and 30 mg QHS for 14 days before increasing to 60 mg BID - Increase propranolol to 10 mg TID prn for anxiety - Continue Topamax 200 mg QD for seizures managed by her neurologist - CBC, CMP, TSH, ANA reviewed - Therapy referral, patient will reach out again - Follow up in a month   Chief Complaint:  Chief Complaint  Patient presents with   Follow-up   HPI: Patient presents reporting that a lot has been going on the past 6 months. Her grandmom had a massive stroke in January.  And after the hospitalization was sent to a rehab which was difficult as the care was subpar.  While at rehab her grandmother had another mini stroke and patient's mother decided that PT/OT should be stopped and grandmother should be taken home for hospice.  Carolyn Miranda disagrees with this assessment and felt that her grandmother could have improved to a degree if she continued with therapy.  This led to some conflict between [place] and her mother.  Ultimately grandmother went home and was put on hospice before passing away a few weeks later.  She ended up passing away a couple days before Carolyn Miranda's birthday.  Wounds he has  been struggling with the passing noting that she had grew up with her grandmother since she was 20 years old and that her grandmother was the main member of her family that she really got along with.  Since the passing she has found that she has been getting irritable more frequently than in the past.  She also feels like the depression has increased to a degree and she is spending more time sleeping during the day.  Patient has been taking the  propranolol and Cymbalta which she does feel helps her anxiety but does not feel like this is covering her depressive symptoms any further.  We discussed grief and the difficulty dealing with the loss as well as the importance of processing this.  Patient notes that she has not connected with therapy yet but was open to being referred.  We also reviewed medication options including increasing Cymbalta and frequency of propranolol.  Patient was open to both options and risk and benefits were reviewed.  Past Psychiatric History: Has been on medications in the past. Has tried Wellbutrin, Zoloft, possibly Prozac. Lamictal got a rash. Tried BuSpar which she think helped some.  Patient had a therapist in the past which she thought was helpful.  Increase Cymbalta to 60 mg daily on 06/04/2022.  Past Medical History:  Past Medical History:  Diagnosis Date   Allergies    Anxiety    Asthma    Depression    Migraines    Seizure (HCC)    Urine incontinence     Past Surgical History:  Procedure Laterality Date   BREAST REDUCTION SURGERY Bilateral 07/08/2017   WISDOM TOOTH EXTRACTION      Family Psychiatric History: Denies  Family History:  Family History  Problem Relation Age of Onset   Mental illness Mother    Drug abuse Mother        overmedicating rx by psych   Miscarriages / [place] Mother    Depression Mother    Arthritis Mother    Diabetes Father    Arthritis Maternal Grandmother    Asthma Maternal Grandmother     Social History:  Social History   Socioeconomic History   Marital status: Single    Spouse name: Not on file   Number of children: Not on file   Years of education: Not on file   Highest education level: Not on file  Occupational History   Not on file  Tobacco Use   Smoking status: Never    Passive exposure: Current   Smokeless tobacco: Never  Vaping Use   Vaping Use: Never used  Substance and Sexual Activity   Alcohol use: Yes    Comment: socially    Drug use: No   Sexual activity: Not on file  Other Topics Concern   Not on file  Social History Narrative   Not on file   Social Determinants of Health   Financial Resource Strain: Not on file  Food Insecurity: Not on file  Transportation Needs: Not on file  Physical Activity: Not on file  Stress: Not on file  Social Connections: Not on file    Allergies:  Allergies  Allergen Reactions   Bupropion Nausea And Vomiting and Other (See Comments)   Lamotrigine Rash    Rash on left side of face Rash on left side of face Rash on left side of face Rash on left side of face    Celexa [Citalopram Hydrobromide] Diarrhea and Nausea And Vomiting   Other  Dairy     Current Medications: Current Outpatient Medications  Medication Sig Dispense Refill   albuterol (PROVENTIL HFA;VENTOLIN HFA) 108 (90 Base) MCG/ACT inhaler Inhale 2 puffs into the lungs every 6 (six) hours as needed for wheezing or shortness of breath. 1 Inhaler 1   albuterol (VENTOLIN HFA) 108 (90 Base) MCG/ACT inhaler Inhale 1 puff into the lungs every 4 (four) hours. 18 g 3   clindamycin (CLINDAGEL) 1 % gel APPLY A THIN LAYER TO THE AFFECTED AREA(S) BY TOPICAL ROUTE 2 TIMES PER DAY 30 g 3   cyclobenzaprine (FLEXERIL) 5 MG tablet Take 1 tablet (5 mg total) by mouth at bedtime as needed for muscle spasms. 30 tablet 1   DULoxetine (CYMBALTA) 30 MG capsule Take 1 capsule (30 mg total) by mouth at bedtime for 14 days. (Take with 60mg  in the morning) 14 capsule 0   DULoxetine (CYMBALTA) 60 MG capsule Take 1 capsule by mouth in the morning with 30mg  in the evening for 14 days. Increase to take 1 capsule (60 mg total) by mouth 2 (two) times daily. 60 capsule 2   fluticasone (FLONASE) 50 MCG/ACT nasal spray Place 2 sprays into both nostrils daily. 16 g 6   pantoprazole (PROTONIX) 40 MG tablet Take 1 tablet (40 mg total) by mouth daily. 30 tablet 1   propranolol (INDERAL) 10 MG tablet Take 1 tablet (10 mg total) by mouth 3 (three)  times daily as needed (anxiety). 90 tablet 2   topiramate (TOPAMAX) 100 MG tablet Take 2 tablets (200 mg total) by mouth daily for seizure control 180 tablet 0   topiramate (TOPAMAX) 100 MG tablet Take 2 tablets by mouth every evening. 60 tablet 0   topiramate (TOPAMAX) 100 MG tablet Take 2 tablets (200 mg total) by mouth daily at night. 180 tablet 3   No current facility-administered medications for this visit.     Musculoskeletal: Strength & Muscle Tone: within normal limits Gait & Station: normal Patient leans: N/A  Psychiatric Specialty Exam: Review of Systems  Blood pressure 121/83, pulse 73, height 5\' 4"  (1.626 m), weight 222 lb (100.7 kg).Body mass index is 38.11 kg/m.  General Appearance: Fairly Groomed  Eye Contact:  Good  Speech:  Clear and Coherent and Normal Rate  Volume:  Normal  Mood:  Depressed and Euthymic  Affect:  Constricted  Thought Process:  Coherent and Linear  Orientation:  Full (Time, Place, and Person)  Thought Content: Logical   Suicidal Thoughts:  No  Homicidal Thoughts:  No  Memory:  Immediate;   Good  Judgement:  Good  Insight:  Fair  Psychomotor Activity:  Normal  Concentration:  Concentration: Good  Recall:  Good  Fund of Knowledge: Good  Language: Good  Akathisia:  NA    AIMS (if indicated): not done  Assets:  Communication Skills Desire for Improvement Housing Social Support  ADL's:  Intact  Cognition: WNL  Sleep:  Good   Metabolic Disorder Labs: Lab Results  Component Value Date   HGBA1C 4.7 01/16/2015   No results found for: "PROLACTIN" Lab Results  Component Value Date   CHOL 204 (H) 01/16/2015   TRIG 358.0 (H) 01/16/2015   HDL 47.80 01/16/2015   CHOLHDL 4 01/16/2015   VLDL 71.6 (H) 01/16/2015   Lab Results  Component Value Date   TSH 2.39 04/02/2022   TSH 4.54 (H) 01/16/2015    Therapeutic Level Labs: No results found for: "LITHIUM" No results found for: "VALPROATE" No results found for:  "CBMZ"  Screenings: PHQ2-9    Flowsheet Row Office Visit from 11/21/2022 in [facility] Office Visit from [facility]/05/2022 in [facility]  PHQ-2 Total Score 0 4  PHQ-9 Total Score -- 11       Collaboration of Care: Collaboration of Care: Medication Management AEB medication prescription and Primary Care Provider AEB chart review  Patient/Guardian was advised Release of Information must be obtained prior to any record release in order to collaborate their care with an outside provider. Patient/Guardian was advised if they have not already done so to contact the registration department to sign all necessary forms in order for [place] to release information regarding their care.   Consent: Patient/Guardian gives verbal consent for treatment and assignment of benefits for services provided during this visit. Patient/Guardian expressed understanding and agreed to proceed.    Stasia Cavalier, MD 11/26/2022, 9:36 AM

## 2022-11-27 ENCOUNTER — Encounter: Payer: Medicaid Other | Admitting: Physical Medicine & Rehabilitation

## 2022-11-28 ENCOUNTER — Other Ambulatory Visit (HOSPITAL_COMMUNITY): Payer: Self-pay

## 2022-12-03 ENCOUNTER — Ambulatory Visit: Payer: Commercial Managed Care - HMO | Admitting: Family Medicine

## 2022-12-05 ENCOUNTER — Other Ambulatory Visit (HOSPITAL_COMMUNITY): Payer: Self-pay

## 2022-12-11 ENCOUNTER — Other Ambulatory Visit (HOSPITAL_COMMUNITY): Payer: Self-pay

## 2022-12-12 ENCOUNTER — Other Ambulatory Visit (HOSPITAL_COMMUNITY): Payer: Self-pay

## 2022-12-19 ENCOUNTER — Other Ambulatory Visit (HOSPITAL_COMMUNITY): Payer: Self-pay

## 2022-12-19 ENCOUNTER — Encounter: Payer: Self-pay | Admitting: Physical Medicine & Rehabilitation

## 2022-12-19 ENCOUNTER — Encounter: Payer: Medicaid Other | Attending: Physical Medicine & Rehabilitation | Admitting: Physical Medicine & Rehabilitation

## 2022-12-19 ENCOUNTER — Ambulatory Visit: Payer: Commercial Managed Care - HMO | Admitting: Family Medicine

## 2022-12-19 VITALS — BP 117/74 | HR 76 | Ht 64.0 in | Wt 225.0 lb

## 2022-12-19 DIAGNOSIS — R252 Cramp and spasm: Secondary | ICD-10-CM | POA: Diagnosis present

## 2022-12-19 DIAGNOSIS — M797 Fibromyalgia: Secondary | ICD-10-CM

## 2022-12-19 DIAGNOSIS — F419 Anxiety disorder, unspecified: Secondary | ICD-10-CM | POA: Diagnosis not present

## 2022-12-19 DIAGNOSIS — R4589 Other symptoms and signs involving emotional state: Secondary | ICD-10-CM

## 2022-12-19 MED ORDER — DICLOFENAC SODIUM 1 % EX GEL
4.0000 g | Freq: Four times a day (QID) | CUTANEOUS | 3 refills | Status: AC
  Filled 2022-12-19: qty 100, 6d supply, fill #0
  Filled 2023-05-28 – 2023-07-01 (×2): qty 100, 6d supply, fill #1
  Filled 2023-09-30: qty 100, 6d supply, fill #2

## 2022-12-19 MED ORDER — CYCLOBENZAPRINE HCL 5 MG PO TABS
5.0000 mg | ORAL_TABLET | Freq: Every evening | ORAL | 1 refills | Status: DC | PRN
Start: 2022-12-19 — End: 2023-04-08
  Filled 2022-12-19: qty 30, 30d supply, fill #0

## 2022-12-19 MED ORDER — PREGABALIN 75 MG PO CAPS
75.0000 mg | ORAL_CAPSULE | Freq: Two times a day (BID) | ORAL | 0 refills | Status: DC
  Filled 2022-12-19: qty 60, 30d supply, fill #0

## 2022-12-19 NOTE — Progress Notes (Signed)
Subjective:    Patient ID: Carolyn Miranda, female    DOB: [birth year], 32 y.o.   MRN: [id number]  HPI  HPI  Carolyn Miranda is a 32 y.o. year old female  who  has a past medical history of Allergies, Anxiety, Asthma, Depression, Migraines, Seizure (HCC), and Urine incontinence.   They are presenting to PM&R clinic as a new patient for pain management evaluation. They were referred by Dr. Dimple Casey rheumatology for treatment of Fibromyalgia pain. Diagnosed with fibromyalgia December 2023.   She has had pain for several years until her PCP who referred her to Rheumatology Dr. Dimple Casey who diagnosed her with fibromyalgia. Her grandmother had fibromyalgia. 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 Her grandmother passed in February and she had anouther family member pass more recently.  Her mood has been decreased due to the losses in her family.  Standing for a long time worsens her pain.  Most of her pain is in her lower extremities.  She is also having a lot of pain in her left shoulder. She also has pelvic floor pain and will follow-up with gynecology. She has a lot of pain in her lower back. She does have pain throughout her body. She has a hard time getting comfortable and often has to change position. She also has a lot of pain in her feet and toes.   She has hx of depression and poor sleep. She is following with psychiatry. Recently her cymbalta dose was increased.    Red flag symptoms: No red flags for back pain endorsed in Hx or ROS  Medications tried: Topical medications- she uses icy hot sometimes Nsaids-does not take due to use of cymbalta  Tylenol  - denies benefit  Opiates - Denies Gabapentin / Lyrica - denies  TCAs denies  SNRIs  Cymbalta- helps her pain  Flexeril-  CBD,HTC gummies-she says these are purchased we will  Other treatments: PT- Tried last year, limited by cost in past Chiropractor-  helps TENs unit - helped when tried at chriopractor Injections - dry needling helped once Surgery- Denies    Prior UDS results:     Component Value Date/Time   LABOPIA NONE DETECTED 08/01/2019 1023   COCAINSCRNUR NONE DETECTED 08/01/2019 1023   LABBENZ NONE DETECTED 08/01/2019 1023   AMPHETMU NONE DETECTED 08/01/2019 1023   THCU POSITIVE (A) 08/01/2019 1023   LABBARB NONE DETECTED 08/01/2019 1023        Pain Inventory Average Pain 8 Pain Right Now 5 My pain is intermittent, constant, sharp, stabbing, tingling, and aching  In the last 24 hours, has pain interfered with the following? General activity 7 Relation with others 6 Enjoyment of life 7 What TIME of day is your pain at its worst? morning , evening, and night Sleep (in general) Poor  Pain is worse with: walking, bending, and standing Pain improves with: rest, heat/ice, and TENS Relief from Meds: 1  walk without assistance how many minutes can you walk? 10-15 ability to climb steps?  yes do you drive?  yes Do you have any goals in this area?  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  yes  depression anxiety  Any changes since last visit?  no  Any changes since last visit?  no    Family History  Problem Relation Age of Onset   Mental illness Mother    Drug abuse Mother        overmedicating rx by psych   Miscarriages / [place] Mother    Depression Mother    Arthritis Mother    Diabetes Father    Arthritis Maternal Grandmother    Asthma Maternal Grandmother    Social History   Socioeconomic History   Marital status: Single    Spouse name: Not on file   Number of children: Not on file   Years of education: Not on file   Highest education level: Not on file  Occupational History   Not on file  Tobacco Use   Smoking status: Never    Passive exposure: Current   Smokeless  tobacco: Never  Vaping Use   Vaping Use: Never used  Substance and Sexual Activity   Alcohol use: Yes    Comment: socially   Drug use: No   Sexual activity: Not on file  Other Topics Concern   Not on file  Social History Narrative   Not on file   Social Determinants of Health   Financial Resource Strain: Not on file  Food Insecurity: Not on file  Transportation Needs: Not on file  Physical Activity: Not on file  Stress: Not on file  Social Connections: Not on file   Past Surgical History:  Procedure Laterality Date   BREAST REDUCTION SURGERY Bilateral 07/08/2017   WISDOM TOOTH EXTRACTION     Past Medical History:  Diagnosis Date   Allergies    Anxiety    Asthma    Depression    Migraines    Seizure (HCC)    Urine incontinence    Ht 5\' 4"  (1.626 m)   Wt 225 lb (102.1 kg)   BMI 38.62 kg/m   Opioid Risk Score:   Fall Risk Score:  `1  Depression screen [facility] 2/9     11/21/2022    4:26 PM 05/05/2022    1:58 PM  Depression screen PHQ 2/9  Decreased Interest 0   Down, Depressed, Hopeless 0   PHQ - 2 Score 0   Altered sleeping    Tired, decreased energy    Change in appetite    Feeling bad or failure about yourself     Trouble concentrating    Moving slowly or fidgety/restless    Suicidal thoughts    PHQ-9 Score       Information is confidential and restricted. Go to Review Flowsheets to unlock data.      Review of Systems  Musculoskeletal:  Positive for back pain, myalgias and neck pain.       B/L foot pain B/L hand pain  B/L shoulder pain   All other systems reviewed and are negative.     Objective:   Physical Exam   Gen: no distress, normal appearing HEENT: oral mucosa pink and moist, NCAT Cardio: Reg rate Chest: normal effort, normal rate of breathing Abd: soft, non-distended Ext: no edema Psych: pleasant, normal affect Skin: intact Neuro: Alert and awake, follows commands, cranial nerves II through XII grossly intact, normal speech and  language Strength 5 out of 5 in bilateral upper extremities and lower extremities DTR normal and symmetric bilaterally Sensation intact light touch in all 4 extremities No dysmetria or ataxia noted No ankle clonus Musculoskeletal:  Patient is tender to palpation throughout her C-spine paraspinal muscles, bilateral right calf, left elbow, bilateral shoulders, bilateral knees  No joint swelling or erythema noted SLR negative bilaterally Lumbar range of motion appears to be intact     Assessment & Plan:   Fibromyalgia -Order Voltaren gel, suspect this may be helpful as she cannot tolerate oral NSAIDs due to her antidepressant use -She uses CBD Gummies with benefit, while I do not generally recommend CBD Gummies I would not insist she discontinue these at this time either -order zynex nexwave  -We discussed at length plan to gradually increase low impact progressive exercise and how this can improve pain and fibromyalgia -Consider aqua therapy referral at a later time -Provided list of foods that can be helpful for pain, consider trying turmeric -Continue Flexeril as needed, she primarily uses this at night -Will start Lyrica 75 mg twice daily  Muscle cramps -Advised to try magnesium   Anxiety and depressed mood -Suspect this is contributing to her pain, particularly with recent deaths in the family -Psychiatry has recently increased duloxetine, I think this is a great medication as a may also provide benefit for her fibromyalgia

## 2022-12-29 ENCOUNTER — Ambulatory Visit (HOSPITAL_COMMUNITY): Payer: Medicaid Other | Admitting: Psychiatry

## 2023-02-02 ENCOUNTER — Other Ambulatory Visit (HOSPITAL_COMMUNITY): Payer: Self-pay

## 2023-02-02 ENCOUNTER — Other Ambulatory Visit: Payer: Self-pay | Admitting: Physical Medicine & Rehabilitation

## 2023-02-03 ENCOUNTER — Other Ambulatory Visit (HOSPITAL_COMMUNITY): Payer: Self-pay

## 2023-02-04 ENCOUNTER — Other Ambulatory Visit (HOSPITAL_COMMUNITY): Payer: Self-pay

## 2023-02-04 ENCOUNTER — Other Ambulatory Visit: Payer: Self-pay

## 2023-02-04 MED ORDER — PREGABALIN 75 MG PO CAPS
75.0000 mg | ORAL_CAPSULE | Freq: Two times a day (BID) | ORAL | 0 refills | Status: DC
  Filled 2023-02-04: qty 60, 30d supply, fill #0

## 2023-02-09 ENCOUNTER — Other Ambulatory Visit (HOSPITAL_COMMUNITY): Payer: Self-pay

## 2023-02-19 ENCOUNTER — Encounter: Payer: Medicaid Other | Admitting: Physical Medicine & Rehabilitation

## 2023-03-12 ENCOUNTER — Other Ambulatory Visit (HOSPITAL_COMMUNITY): Payer: Self-pay

## 2023-03-17 ENCOUNTER — Other Ambulatory Visit (HOSPITAL_COMMUNITY): Payer: Self-pay

## 2023-03-17 ENCOUNTER — Other Ambulatory Visit: Payer: Self-pay | Admitting: Physical Medicine & Rehabilitation

## 2023-03-17 MED ORDER — PREGABALIN 75 MG PO CAPS
75.0000 mg | ORAL_CAPSULE | Freq: Two times a day (BID) | ORAL | 0 refills | Status: DC
  Filled 2023-03-17: qty 60, 30d supply, fill #0

## 2023-03-17 NOTE — Telephone Encounter (Signed)
PMP was Reviewed.  Dr Natale Lay note was reviewed.

## 2023-03-17 NOTE — Telephone Encounter (Signed)
Are you willing to refill in Dr. Sande Rives absence?

## 2023-03-19 ENCOUNTER — Other Ambulatory Visit (HOSPITAL_COMMUNITY): Payer: Self-pay

## 2023-03-19 ENCOUNTER — Telehealth: Payer: Self-pay | Admitting: Family Medicine

## 2023-03-19 DIAGNOSIS — J4520 Mild intermittent asthma, uncomplicated: Secondary | ICD-10-CM

## 2023-03-19 MED ORDER — ALBUTEROL SULFATE HFA 108 (90 BASE) MCG/ACT IN AERS
2.0000 | INHALATION_SPRAY | Freq: Four times a day (QID) | RESPIRATORY_TRACT | 0 refills | Status: DC | PRN
Start: 2023-03-19 — End: 2023-05-01
  Filled 2023-03-19: qty 18, 25d supply, fill #0

## 2023-03-19 NOTE — Addendum Note (Signed)
Addended by: Marinus Maw on: 03/19/2023 10:56 AM   Modules accepted: Orders

## 2023-03-19 NOTE — Telephone Encounter (Signed)
Prescription Request  03/19/2023  LOV: 11/21/2022  What is the name of the medication or equipment? albuterol (PROVENTIL HFA;VENTOLIN HFA) 108 (90 Base) MCG/ACT inhaler   Have you contacted your pharmacy to request a refill? No   Which pharmacy would you like this sent to?  [place] [facility] [address] [place] [address] Phone: [phone] Fax: [phone]    Patient notified that their request is being sent to the clinical staff for review and that they should receive a response within 2 business days.   Please advise at Mobile [phone] (mobile)

## 2023-03-19 NOTE — Telephone Encounter (Signed)
1st fill with you.. ok to refill?  

## 2023-03-19 NOTE — Telephone Encounter (Signed)
Rx sent 

## 2023-03-24 ENCOUNTER — Other Ambulatory Visit (HOSPITAL_COMMUNITY): Payer: Self-pay

## 2023-03-24 ENCOUNTER — Encounter: Payer: Medicaid Other | Attending: Physical Medicine & Rehabilitation | Admitting: Physical Medicine & Rehabilitation

## 2023-03-24 ENCOUNTER — Encounter: Payer: Self-pay | Admitting: Physical Medicine & Rehabilitation

## 2023-03-24 VITALS — BP 110/83 | HR 83 | Ht 64.0 in | Wt 221.0 lb

## 2023-03-24 DIAGNOSIS — R252 Cramp and spasm: Secondary | ICD-10-CM | POA: Diagnosis present

## 2023-03-24 DIAGNOSIS — M797 Fibromyalgia: Secondary | ICD-10-CM | POA: Diagnosis not present

## 2023-03-24 DIAGNOSIS — R4589 Other symptoms and signs involving emotional state: Secondary | ICD-10-CM | POA: Diagnosis present

## 2023-03-24 MED ORDER — PREGABALIN 100 MG PO CAPS
100.0000 mg | ORAL_CAPSULE | Freq: Two times a day (BID) | ORAL | 3 refills | Status: DC
  Filled 2023-03-24: qty 68, 34d supply, fill #0
  Filled 2023-04-22: qty 60, 30d supply, fill #0
  Filled 2023-06-04: qty 60, 30d supply, fill #1

## 2023-03-24 NOTE — Progress Notes (Signed)
Subjective:    Patient ID: Carolyn Miranda, female    DOB: 1990/11/20, 32 y.o.   MRN: [id number]  HPI    HPI   Carolyn Miranda is a 32 y.o. year old female  who  has a past medical history of Allergies, Anxiety, Asthma, Depression, Migraines, Seizure (HCC), and Urine incontinence.   They are presenting to PM&R clinic as a new patient for pain management evaluation. They were referred by Dr. Dimple Casey rheumatology for treatment of Fibromyalgia pain. Diagnosed with fibromyalgia December 2023.   She has had pain for several years until her PCP who referred her to Rheumatology Dr. Dimple Casey who diagnosed her with fibromyalgia. Her grandmother had fibromyalgia. 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 Her grandmother passed in February and she had anouther family member pass more recently.  Her mood has been decreased due to the losses in her family.  Standing for a long time worsens her pain.  Most of her pain is in her lower extremities.  She is also having a lot of pain in her left shoulder. She also has pelvic floor pain and will follow-up with gynecology. She has a lot of pain in her lower back. She does have pain throughout her body. She has a hard time getting comfortable and often has to change position. She also has a lot of pain in her feet and toes.   She has hx of depression and poor sleep. She is following with psychiatry. Recently her cymbalta dose was increased.      Red flag symptoms: No red flags for back pain endorsed in Hx or ROS   Medications tried: Topical medications- she uses icy hot sometimes Nsaids-does not take due to use of cymbalta  Tylenol  - denies benefit  Opiates - Denies Gabapentin / Lyrica - denies  TCAs denies  SNRIs  Cymbalta- helps her pain  Flexeril-  CBD,HTC gummies-she says these are purchased we will   Other treatments: PT- Tried last year, limited by cost in  past Chiropractor- helps TENs unit - helped when tried at chriopractor Injections - dry needling helped once Surgery- Denies     Interval History 03/24/23 Carolyn Miranda is here for follow-up regarding her chronic pain.  She reports multiple areas of stress going on including issues at work, loose bowel movements.  She is trying to modify her diet to help with loose bowel movements.  She does report a couple episodes where she had word finding difficulties.  She reports she had similar incidences when she first started taking topiramate and she has a follow-up with neurology scheduled.  She reports she continues to have body wide pain that limits her activities, although it has improved with initiation of Lyrica.  She has not yet been able to get a TENS unit due to cost.   Pain Inventory Average Pain 6 Pain Right Now 5 My pain is constant, sharp, burning, stabbing, tingling, and aching  In the last 24 hours, has pain interfered with the following? General activity 4 Relation with others 2 Enjoyment of life 6 What TIME of day is your pain at its worst? night Sleep (in general) Poor  Pain is worse with: bending and standing Pain improves with: rest, heat/ice, therapy/exercise, medication, and injections Relief from Meds: 5  Family History  Problem Relation Age of Onset   Mental illness Mother  Drug abuse Mother        overmedicating rx by psych   Miscarriages / [place] Mother    Depression Mother    Arthritis Mother    Diabetes Father    Arthritis Maternal Grandmother    Asthma Maternal Grandmother    Social History   Socioeconomic History   Marital status: Single    Spouse name: Not on file   Number of children: Not on file   Years of education: Not on file   Highest education level: Not on file  Occupational History   Not on file  Tobacco Use   Smoking status: Never    Passive exposure: Current   Smokeless tobacco: Never  Vaping Use   Vaping status: Never Used   Substance and Sexual Activity   Alcohol use: Yes    Comment: socially   Drug use: No   Sexual activity: Not on file  Other Topics Concern   Not on file  Social History Narrative   Not on file   Social Determinants of Health   Financial Resource Strain: Not on file  Food Insecurity: Not on file  Transportation Needs: Not on file  Physical Activity: Not on file  Stress: Not on file  Social Connections: Not on file   Past Surgical History:  Procedure Laterality Date   BREAST REDUCTION SURGERY Bilateral 07/08/2017   WISDOM TOOTH EXTRACTION     Past Surgical History:  Procedure Laterality Date   BREAST REDUCTION SURGERY Bilateral 07/08/2017   WISDOM TOOTH EXTRACTION     Past Medical History:  Diagnosis Date   Allergies    Anxiety    Asthma    Depression    Migraines    Seizure (HCC)    Urine incontinence    BP 110/83   Pulse 83   Ht 5\' 4"  (1.626 m)   Wt 221 lb (100.2 kg)   SpO2 95%   BMI 37.93 kg/m   Opioid Risk Score:   Fall Risk Score:  `1  Depression screen [facility] 2/9     12/19/2022    2:57 PM 11/21/2022    4:26 PM 05/05/2022    1:58 PM  Depression screen PHQ 2/9  Decreased Interest 1 0   Down, Depressed, Hopeless 2 0   PHQ - 2 Score 3 0   Altered sleeping 2    Tired, decreased energy 3    Change in appetite 3    Feeling bad or failure about yourself  2    Trouble concentrating 1    Moving slowly or fidgety/restless 1    Suicidal thoughts 1    PHQ-9 Score 16    Difficult doing work/chores Very difficult       Information is confidential and restricted. Go to Review Flowsheets to unlock data.     Review of Systems  Musculoskeletal:  Positive for back pain and neck pain.       B/L knee foot thumb/hand abd pain shoulder  All other systems reviewed and are negative.      Objective:   Physical Exam   Gen: no distress, normal appearing HEENT: oral mucosa pink and moist, NCAT Cardio: Reg rate Chest: normal effort, normal rate of  breathing Abd: soft, non-distended Ext: no edema Psych: pleasant, normal affect Skin: intact Neuro: Alert and awake, follows commands, cranial nerves II through XII grossly intact, normal speech and language No focal motor deficits Sensation intact light touch in all 4 extremities No dysmetria or ataxia noted No ankle  clonus Musculoskeletal:  Patient is tender to palpation at her lumbar spine paraspinal muscles, bilateral feet and ankles, shoulders       Assessment & Plan:    Fibromyalgia -Continue Voltaren gel, oral NSAIDs limited by use of antidepressants -She uses CBD Gummies with benefit reported -Zynex nexwave -  she plans to try this when finances allow -Discussed low impact progressive exercise again -Consider aquatic therapy referral at a later time-she says she will research this further -Prior visit she was provided list of foods that can be helpful for pain, consider trying turmeric -Continue Flexeril as needed, she primarily uses this at night -Increase Lyrica two 100mg  twice daily -Currently on duloxetine for mood -She has had some loose bowel movements, this may be IBS    Muscle cramps -Advised to try magnesium    Anxiety and depressed mood -Denies SI or HI.  She follows with psychiatry and is on duloxetine

## 2023-04-03 ENCOUNTER — Other Ambulatory Visit (HOSPITAL_COMMUNITY): Payer: Self-pay

## 2023-04-07 NOTE — Progress Notes (Unsigned)
Office Visit Note  Patient: Carolyn Miranda             Date of Birth: 1990/10/04           MRN: [id number]             PCP: Avanell Shackleton, NP-C Referring: Avanell Shackleton, NP-C Visit Date: 04/08/2023   Subjective:  No chief complaint on file.   History of Present Illness: Carolyn Miranda is a 32 y.o. female here for follow up for fibromyalgia and chronic pain with positive ANA.    Previous HPI 10/06/2022 Carolyn Miranda is a 32 y.o. female here for follow up here for fibromyalgia and chronic pain with positive ANA.  Since her last visit she has felt some benefit for anxiety with the Cymbalta but has not seen a big improvement in her depression or chronic pain.  Struggling a bit psychologically she has been ill also her grandmother passed away and feels her depression is overall not well-controlled.  She been recommended for sleep study both although going on needs to reschedule this and get checked out.  She reports waking up 2 or 3 times nightly on most nights.   Previous HPI 07/02/22 Carolyn Miranda is a 32 y.o. female here for evaluation of positive ANA associated with chronic pain in multiple areas worst in back and hips discussed with PCP office and somewhat episodic. Reports family history of fibromyalgia.  She has had ongoing symptoms somewhat chronically but particularly notices problems being worse within the past 1 year.  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  Also experiencing increases in left hip and low back pain often this is the most painful area.  Does awaken sometimes at nighttime and has to reposition associated with the left hip pain.  Outside of the pedal edema she does not see a lot of visible joint swelling associated with the pain.  She is also noticed frequent sleep disturbances and has  fatigue on a daily basis.  Has difficulty with concentration and short-term memory and tasks.  She has noticed mildly erythematous rashes coming and going transiently throughout frequently on her extremities these are itchy and resolved with no residual skin changes or hyperpigmentation.  She has not noticed ulcers or sores in the mouth or nose.  Does not describe frequent cervical axillary or inguinal lymphadenopathy.  She has occasionally had transient redness in her fingers but no typical Raynaud's symptoms.   Labs reviewed ANA 1:160 nucleolar RF neg ESR 5 CRP <1   No Rheumatology ROS completed.   PMFS History:  Patient Active Problem List   Diagnosis Date Noted   Myofascial pain 10/06/2022   Gastroesophageal reflux disease 06/04/2022   Social anxiety disorder 05/05/2022   GAD (generalized anxiety disorder) 05/05/2022   Antinuclear antibody (ANA) titer greater than 1:80 04/08/2022   Back pain 04/02/2022   Fibromyalgia 04/02/2022   Pelvic pain 04/02/2022   Bilateral hand pain 04/02/2022   Seizures (HCC) 11/02/2019   Encounter for monitoring anticonvulsant therapy 11/01/2019   Allergic rhinitis 03/26/2018   Neck pain 12/27/2015   Encounter for birth control 08/24/2015   Asthma 04/20/2015   Adjustment disorder with mixed anxiety and depressed mood 01/16/2015   Obesity (BMI 30-39.9) 01/16/2015    Past Medical History:  Diagnosis Date   Allergies  Anxiety    Asthma    Depression    Migraines    Seizure (HCC)    Urine incontinence     Family History  Problem Relation Age of Onset   Mental illness Mother    Drug abuse Mother        overmedicating rx by psych   Miscarriages / [place] Mother    Depression Mother    Arthritis Mother    Diabetes Father    Arthritis Maternal Grandmother    Asthma Maternal Grandmother    Past Surgical History:  Procedure Laterality Date   BREAST REDUCTION SURGERY Bilateral 07/08/2017   WISDOM TOOTH EXTRACTION     Social History    Social History Narrative   Not on file   Immunization History  Administered Date(s) Administered   HPV Quadrivalent 03/18/2006, 05/19/2006, 09/10/2006   PFIZER Comirnaty(Gray Top)Covid-19 Tri-Sucrose Vaccine 08/23/2020   PFIZER(Purple Top)SARS-COV-2 Vaccination 01/09/2020, 01/30/2020   PPD Test 03/01/2019     Objective: Vital Signs: There were no vitals taken for this visit.   Physical Exam   Musculoskeletal Exam: ***  CDAI Exam: CDAI Score: -- Patient Global: --; Provider Global: -- Swollen: --; Tender: -- Joint Exam 04/08/2023   No joint exam has been documented for this visit   There is currently no information documented on the homunculus. Go to the Rheumatology activity and complete the homunculus joint exam.  Investigation: No additional findings.  Imaging: No results found.  Recent Labs: Lab Results  Component Value Date   WBC 5.2 04/02/2022   HGB 14.3 04/02/2022   PLT 258.0 04/02/2022   NA 137 04/02/2022   K 3.8 04/02/2022   CL 109 04/02/2022   CO2 20 04/02/2022   GLUCOSE 82 04/02/2022   BUN 10 04/02/2022   CREATININE 0.58 04/02/2022   BILITOT 0.5 04/02/2022   ALKPHOS 59 04/02/2022   AST 17 04/02/2022   ALT 11 04/02/2022   PROT 6.8 04/02/2022   ALBUMIN 4.1 04/02/2022   CALCIUM 8.9 04/02/2022   GFRAA >60 08/01/2019    Speciality Comments: No specialty comments available.  Procedures:  No procedures performed Allergies: Bupropion, Lamotrigine, Celexa [citalopram hydrobromide], and Other   Assessment / Plan:     Visit Diagnoses: No diagnosis found.  ***  Orders: No orders of the defined types were placed in this encounter.  No orders of the defined types were placed in this encounter.    Follow-Up Instructions: No follow-ups on file.   Metta Clines, RT  Note - This record has been created using [employer].  Chart creation errors have been sought, but may not always  have been located. Such creation errors do not  reflect on  the standard of medical care.

## 2023-04-08 ENCOUNTER — Encounter: Payer: Self-pay | Admitting: Internal Medicine

## 2023-04-08 ENCOUNTER — Other Ambulatory Visit (HOSPITAL_BASED_OUTPATIENT_CLINIC_OR_DEPARTMENT_OTHER): Payer: Self-pay

## 2023-04-08 ENCOUNTER — Ambulatory Visit: Payer: Medicaid Other | Attending: Internal Medicine | Admitting: Internal Medicine

## 2023-04-08 ENCOUNTER — Other Ambulatory Visit (HOSPITAL_COMMUNITY): Payer: Self-pay

## 2023-04-08 VITALS — BP 109/70 | HR 72 | Resp 14 | Ht 64.0 in | Wt 219.0 lb

## 2023-04-08 DIAGNOSIS — R760 Raised antibody titer: Secondary | ICD-10-CM | POA: Diagnosis not present

## 2023-04-08 DIAGNOSIS — M79642 Pain in left hand: Secondary | ICD-10-CM

## 2023-04-08 DIAGNOSIS — M797 Fibromyalgia: Secondary | ICD-10-CM | POA: Diagnosis not present

## 2023-04-08 DIAGNOSIS — M79641 Pain in right hand: Secondary | ICD-10-CM | POA: Diagnosis not present

## 2023-04-08 DIAGNOSIS — M7062 Trochanteric bursitis, left hip: Secondary | ICD-10-CM

## 2023-04-08 DIAGNOSIS — M722 Plantar fascial fibromatosis: Secondary | ICD-10-CM

## 2023-04-08 MED ORDER — CYCLOBENZAPRINE HCL 5 MG PO TABS
5.0000 mg | ORAL_TABLET | Freq: Every evening | ORAL | 1 refills | Status: DC | PRN
Start: 2023-04-08 — End: 2023-09-30
  Filled 2023-04-08: qty 30, 30d supply, fill #0
  Filled 2023-07-01: qty 30, 30d supply, fill #1

## 2023-04-08 MED ORDER — CELECOXIB 200 MG PO CAPS
200.0000 mg | ORAL_CAPSULE | Freq: Two times a day (BID) | ORAL | 0 refills | Status: DC
Start: 2023-04-08 — End: 2023-05-06
  Filled 2023-04-08: qty 30, 15d supply, fill #0

## 2023-04-09 ENCOUNTER — Other Ambulatory Visit (HOSPITAL_COMMUNITY): Payer: Self-pay

## 2023-04-09 ENCOUNTER — Other Ambulatory Visit: Payer: Self-pay | Admitting: *Deleted

## 2023-04-09 DIAGNOSIS — G8929 Other chronic pain: Secondary | ICD-10-CM

## 2023-04-09 DIAGNOSIS — M7062 Trochanteric bursitis, left hip: Secondary | ICD-10-CM

## 2023-04-09 DIAGNOSIS — M797 Fibromyalgia: Secondary | ICD-10-CM

## 2023-04-15 ENCOUNTER — Other Ambulatory Visit (HOSPITAL_COMMUNITY): Payer: Self-pay

## 2023-04-15 ENCOUNTER — Other Ambulatory Visit: Payer: Self-pay | Admitting: Family Medicine

## 2023-04-15 DIAGNOSIS — J4520 Mild intermittent asthma, uncomplicated: Secondary | ICD-10-CM

## 2023-04-16 ENCOUNTER — Other Ambulatory Visit: Payer: Self-pay

## 2023-04-22 ENCOUNTER — Other Ambulatory Visit (HOSPITAL_COMMUNITY): Payer: Self-pay

## 2023-04-24 ENCOUNTER — Other Ambulatory Visit (HOSPITAL_COMMUNITY): Payer: Self-pay

## 2023-04-29 NOTE — Progress Notes (Signed)
Office Visit Note  Patient: Carolyn Miranda             Date of Birth: [birth year]           MRN: [id number]             PCP: Avanell Shackleton, NP-C Referring: Avanell Shackleton, NP-C Visit Date: 05/13/2023   Subjective:  Follow-up (Stopped taking celebrex caused asthma to flare up)   History of Present Illness: Carolyn Miranda is a 32 y.o. female here for follow up for fibromyalgia and chronic pain with positive ANA.  She is having mildly more trouble with pain especially affecting her feet and hips.  Celebrex was beneficial for short time when she started it but had to stop due to worsening asthma symptoms.  She has appointments to see PM&R and physical therapy regarding chronic myofascial pain but not yet established.  Previous HPI 04/08/2023 Carolyn Miranda is a 32 y.o. female here for follow up for fibromyalgia and chronic pain with positive ANA.  Since our last visit she has been working with Dr. Natale Lay on her chronic pain including addition of twice daily Lyrica is also worked with her behavioral health office for mood disorder that has been a lot worse with multiple family deaths this year.  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  Currently has some pain exacerbation in a few areas.  Increased pain at the base of both thumbs and notices swelling there sometimes.  Is having worse pain in the left leg at the site of the hip and on the bottom of her foot.  She has some pain under the arch on both sides this is most prominent after working on her feet for hours.     Previous HPI 10/06/2022 Carolyn Miranda is a 32 y.o. female here for follow up here for fibromyalgia and chronic pain with positive ANA.  Since her last visit she has felt some benefit for anxiety with the Cymbalta but has not seen a big improvement in her depression or  chronic pain.  Struggling a bit psychologically she has been ill also her grandmother passed away and feels her depression is overall not well-controlled.  She been recommended for sleep study both although going on needs to reschedule this and get checked out.  She reports waking up 2 or 3 times nightly on most nights.   Previous HPI 07/02/22 Carolyn Miranda is a 32 y.o. female here for evaluation of positive ANA associated with chronic pain in multiple areas worst in back and hips discussed with PCP office and somewhat episodic. Reports family history of fibromyalgia.  She has had ongoing symptoms somewhat chronically but particularly notices problems being worse within the past 1 year.  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  Also experiencing increases in left hip and low back pain often this is the most painful area.  Does awaken sometimes at nighttime and has to reposition associated with the left hip pain.  Outside of the pedal edema she does not see a lot of visible joint swelling associated with the pain.  She is also noticed frequent sleep disturbances  and has fatigue on a daily basis.  Has difficulty with concentration and short-term memory and tasks.  She has noticed mildly erythematous rashes coming and going transiently throughout frequently on her extremities these are itchy and resolved with no residual skin changes or hyperpigmentation.  She has not noticed ulcers or sores in the mouth or nose.  Does not describe frequent cervical axillary or inguinal lymphadenopathy.  She has occasionally had transient redness in her fingers but no typical Raynaud's symptoms.   Labs reviewed ANA 1:160 nucleolar RF neg ESR 5 CRP <1   Review of Systems  Constitutional:  Positive for fatigue.  HENT:  Positive for mouth dryness. Negative  for mouth sores.   Eyes:  Negative for dryness.  Respiratory:  Negative for shortness of breath.   Cardiovascular:  Negative for chest pain and palpitations.  Gastrointestinal:  Positive for diarrhea. Negative for blood in stool and constipation.  Endocrine: Negative for increased urination.  Genitourinary:  Negative for involuntary urination.  Musculoskeletal:  Positive for joint pain, joint pain, joint swelling, myalgias and myalgias. Negative for gait problem, muscle weakness, morning stiffness and muscle tenderness.  Skin:  Negative for color change, rash, hair loss and sensitivity to sunlight.  Allergic/Immunologic: Positive for susceptible to infections.  Neurological:  Negative for dizziness and headaches.  Hematological:  Negative for swollen glands.  Psychiatric/Behavioral:  Positive for depressed mood. Negative for sleep disturbance. The patient is not nervous/anxious.     PMFS History:  Patient Active Problem List   Diagnosis Date Noted   Trochanteric bursitis, left hip 04/08/2023   Plantar fasciitis of left foot 04/08/2023   Myofascial pain 10/06/2022   Gastroesophageal reflux disease 06/04/2022   Social anxiety disorder 05/05/2022   GAD (generalized anxiety disorder) 05/05/2022   Antinuclear antibody (ANA) titer greater than 1:80 04/08/2022   Back pain 04/02/2022   Fibromyalgia 04/02/2022   Pelvic pain 04/02/2022   Bilateral hand pain 04/02/2022   Seizures (HCC) 11/02/2019   Encounter for monitoring anticonvulsant therapy 11/01/2019   Allergic rhinitis 03/26/2018   Neck pain 12/27/2015   Encounter for birth control 08/24/2015   Asthma 04/20/2015   Adjustment disorder with mixed anxiety and depressed mood 01/16/2015   Obesity (BMI 30-39.9) 01/16/2015    Past Medical History:  Diagnosis Date   Allergies    Anxiety    Asthma    Depression    Migraines    Seizure (HCC)    Urine incontinence     Family History  Problem Relation Age of Onset   Mental illness  Mother    Drug abuse Mother        overmedicating rx by psych   Miscarriages / [place] Mother    Depression Mother    Arthritis Mother    Diabetes Father    Arthritis Maternal Grandmother    Asthma Maternal Grandmother    Past Surgical History:  Procedure Laterality Date   BREAST REDUCTION SURGERY Bilateral 07/08/2017   WISDOM TOOTH EXTRACTION     Social History   Social History Narrative   Not on file   Immunization History  Administered Date(s) Administered   HPV Quadrivalent 03/18/2006, 05/19/2006, 09/10/2006   PFIZER Comirnaty(Gray Top)Covid-19 Tri-Sucrose Vaccine 08/23/2020   PFIZER(Purple Top)SARS-COV-2 Vaccination 01/09/2020, 01/30/2020   PPD Test 03/01/2019     Objective: Vital Signs: BP 112/80 (BP Location: Right Arm, Patient Position: Sitting, Cuff Size: Normal)   Pulse 73   Ht 5\' 4"  (1.626 m)   Wt 223 lb (101.2 kg)  BMI 38.28 kg/m    Physical Exam Constitutional:      Appearance: She is obese.  HENT:     Mouth/Throat:     Mouth: Mucous membranes are moist.     Pharynx: Oropharynx is clear.  Eyes:     Conjunctiva/sclera: Conjunctivae normal.  Cardiovascular:     Rate and Rhythm: Normal rate and regular rhythm.  Pulmonary:     Effort: Pulmonary effort is normal.     Breath sounds: Normal breath sounds.  Musculoskeletal:     Right lower leg: No edema.     Left lower leg: No edema.  Lymphadenopathy:     Cervical: No cervical adenopathy.  Skin:    General: Skin is warm and dry.     Findings: No rash.  Neurological:     Mental Status: She is alert.  Psychiatric:        Mood and Affect: Mood normal.      Musculoskeletal Exam:  Fingers full ROM no tenderness or swelling Left lateral hip tenderness to pressure, extending partway down thigh, some soft tissue swelling or induration palpable Knees full ROM no tenderness or swelling There is some tenderness on medial and plantar side of left foot, localized swelling or small nodule  present  Investigation: No additional findings.  Imaging: DG Chest 2 View  Result Date: 05/06/2023 CLINICAL DATA:  Productive cough for the past 3 weeks with chest pain and shortness of breath. EXAM: CHEST - 2 VIEW COMPARISON:  None Available. FINDINGS: The heart size and mediastinal contours are within normal limits. Normal pulmonary vascularity. Mild peribronchial thickening. No focal consolidation, pleural effusion, or pneumothorax. No acute osseous abnormality. IMPRESSION: 1. Mild bronchitic changes. Electronically Signed   By: Obie Dredge M.D.   On: 05/06/2023 14:49    Recent Labs: Lab Results  Component Value Date   WBC 5.2 04/02/2022   HGB 14.3 04/02/2022   PLT 258.0 04/02/2022   NA 137 04/02/2022   K 3.8 04/02/2022   CL 109 04/02/2022   CO2 20 04/02/2022   GLUCOSE 82 04/02/2022   BUN 10 04/02/2022   CREATININE 0.58 04/02/2022   BILITOT 0.5 04/02/2022   ALKPHOS 59 04/02/2022   AST 17 04/02/2022   ALT 11 04/02/2022   PROT 6.8 04/02/2022   ALBUMIN 4.1 04/02/2022   CALCIUM 8.9 04/02/2022   GFRAA >60 08/01/2019    Speciality Comments: No specialty comments available.  Procedures:  No procedures performed Allergies: Bupropion, Lamotrigine, Celexa [citalopram hydrobromide], and Other   Assessment / Plan:     Visit Diagnoses: Fibromyalgia   Currently pain management with Cymbalta 60 mg twice daily and Lyrica 100 mg 3 times daily.  Flexeril 5 mg as needed at night had been helpful but not using this recently.  Not a good candidate for repeat NSAIDs due to asthma exacerbation.  Trochanteric bursitis, left hip   Recommendation to get some type of physical therapy going as well might benefit to see aquatic therapy especially for the hip pain.  Discussed option to try for injection but I think this would be a more short-term intervention and she is quite young and symptoms more chronic than acute exacerbation.  Plantar fasciitis of left foot - Plan: Ambulatory referral  to Podiatry  Ongoing symptoms consistent with plantar fasciitis.  Seems to have some type of localized swelling or may be early fibromatosis on exam.  Will refer to podiatry for evaluation of this whether she needs any procedure or specialized orthotics.  Orders: Orders  Placed This Encounter  Procedures   Ambulatory referral to Podiatry   No orders of the defined types were placed in this encounter.    Follow-Up Instructions: Return if symptoms worsen or fail to improve, for Foot pain, hip pain.   Fuller Plan, MD  Note - This record has been created using [employer].  Chart creation errors have been sought, but may not always  have been located. Such creation errors do not reflect on  the standard of medical care.

## 2023-05-01 ENCOUNTER — Telehealth: Payer: Self-pay | Admitting: Family Medicine

## 2023-05-01 ENCOUNTER — Other Ambulatory Visit (HOSPITAL_BASED_OUTPATIENT_CLINIC_OR_DEPARTMENT_OTHER): Payer: Self-pay

## 2023-05-01 ENCOUNTER — Other Ambulatory Visit (HOSPITAL_COMMUNITY): Payer: Self-pay

## 2023-05-01 ENCOUNTER — Other Ambulatory Visit: Payer: Self-pay | Admitting: Family Medicine

## 2023-05-01 DIAGNOSIS — J4520 Mild intermittent asthma, uncomplicated: Secondary | ICD-10-CM

## 2023-05-01 MED ORDER — ALBUTEROL SULFATE HFA 108 (90 BASE) MCG/ACT IN AERS
2.0000 | INHALATION_SPRAY | Freq: Four times a day (QID) | RESPIRATORY_TRACT | 0 refills | Status: DC | PRN
  Filled 2023-05-01: qty 18, 25d supply, fill #0

## 2023-05-01 NOTE — Telephone Encounter (Signed)
Prescription Request  05/01/2023  LOV: 11/21/2022  What is the name of the medication or equipment? albuterol (VENTOLIN HFA) 108 (90 Base) MCG/ACT inhaler   Have you contacted your pharmacy to request a refill? No   Which pharmacy would you like this sent to?  [place] [facility] [address] [place] [address] Phone: [phone] Fax: [phone]    Patient notified that their request is being sent to the clinical staff for review and that they should receive a response within 2 business days.   Please advise at Mobile [phone] (mobile)

## 2023-05-01 NOTE — Telephone Encounter (Signed)
Rx sent 

## 2023-05-06 ENCOUNTER — Encounter: Payer: Self-pay | Admitting: Family Medicine

## 2023-05-06 ENCOUNTER — Other Ambulatory Visit (HOSPITAL_COMMUNITY): Payer: Self-pay

## 2023-05-06 ENCOUNTER — Ambulatory Visit (INDEPENDENT_AMBULATORY_CARE_PROVIDER_SITE_OTHER): Payer: Medicaid Other

## 2023-05-06 ENCOUNTER — Ambulatory Visit (INDEPENDENT_AMBULATORY_CARE_PROVIDER_SITE_OTHER): Payer: Medicaid Other | Admitting: Family Medicine

## 2023-05-06 VITALS — BP 104/76 | HR 68 | Temp 97.6°F | Ht 64.0 in | Wt 217.0 lb

## 2023-05-06 DIAGNOSIS — J4530 Mild persistent asthma, uncomplicated: Secondary | ICD-10-CM

## 2023-05-06 DIAGNOSIS — F411 Generalized anxiety disorder: Secondary | ICD-10-CM | POA: Diagnosis not present

## 2023-05-06 DIAGNOSIS — R058 Other specified cough: Secondary | ICD-10-CM

## 2023-05-06 DIAGNOSIS — R062 Wheezing: Secondary | ICD-10-CM

## 2023-05-06 MED ORDER — FLUTICASONE-SALMETEROL 100-50 MCG/ACT IN AEPB
1.0000 | INHALATION_SPRAY | Freq: Two times a day (BID) | RESPIRATORY_TRACT | 1 refills | Status: AC
Start: 2023-05-06 — End: ?
  Filled 2023-05-06: qty 60, 30d supply, fill #0

## 2023-05-06 MED ORDER — PREDNISONE 20 MG PO TABS
40.0000 mg | ORAL_TABLET | Freq: Every day | ORAL | 0 refills | Status: DC
Start: 2023-05-06 — End: 2023-09-17
  Filled 2023-05-06: qty 10, 5d supply, fill #0

## 2023-05-06 NOTE — Patient Instructions (Signed)
Please go downstairs for a chest x-ray before you leave.  Continue albuterol as needed.  Restart Advair twice daily.  Rinse your mouth out after each use to prevent thrush.  Follow-up in 6 to 8 weeks or sooner if you are not continuing to improve.

## 2023-05-06 NOTE — Progress Notes (Signed)
Subjective:     Patient ID: Carolyn Miranda, female    DOB: 03/04/1991, 32 y.o.   MRN: [id number]  Chief Complaint  Patient presents with   Asthma    For the last month has been going to pain management dr and was prescribed celebrex due to bursitis, after a week on this she felt as if she was getting pneumonia/broncitis, wheezing a lot and coughing fits    Asthma She complains of cough, shortness of breath and wheezing. Associated symptoms include malaise/fatigue and a sore throat. Pertinent negatives include no chest pain, fever or headaches. Her past medical history is significant for asthma.    Discussed the use of AI scribe software for clinical note transcription with the patient, who gave verbal consent to proceed.  History of Present Illness          She is here with complaints of asthma flare approximately 3 weeks ago after taking Celebrex for 1 week.  Reports 75% improvement in the still has a cough that is occasionally productive of green or dark brown sputum.  Wheezing intermittently.  States she used to be on Advair for maintenance and would like to restart this.  Using albuterol 2-3 times per day.     Health Maintenance Due  Topic Date Due   Hepatitis C Screening  Never done   DTaP/Tdap/Td (1 - Tdap) Never done   Cervical Cancer Screening (HPV/Pap Cotest)  Never done   INFLUENZA VACCINE  Never done   COVID-19 Vaccine (4 - 2023-24 season) 03/29/2023    Past Medical History:  Diagnosis Date   Allergies    Anxiety    Asthma    Depression    Migraines    Seizure (HCC)    Urine incontinence     Past Surgical History:  Procedure Laterality Date   BREAST REDUCTION SURGERY Bilateral 07/08/2017   WISDOM TOOTH EXTRACTION      Family History  Problem Relation Age of Onset   Mental illness Mother    Drug abuse Mother        overmedicating rx by psych   Miscarriages / [place] Mother    Depression Mother    Arthritis Mother    Diabetes Father     Arthritis Maternal Grandmother    Asthma Maternal Grandmother     Social History   Socioeconomic History   Marital status: Single    Spouse name: Not on file   Number of children: Not on file   Years of education: Not on file   Highest education level: Not on file  Occupational History   Not on file  Tobacco Use   Smoking status: Never    Passive exposure: Current   Smokeless tobacco: Never  Vaping Use   Vaping status: Never Used  Substance and Sexual Activity   Alcohol use: Not Currently    Comment: socially   Drug use: No   Sexual activity: Not on file  Other Topics Concern   Not on file  Social History Narrative   Not on file   Social Determinants of Health   Financial Resource Strain: Not on file  Food Insecurity: Not on file  Transportation Needs: Not on file  Physical Activity: Not on file  Stress: Not on file  Social Connections: Not on file  Intimate Partner Violence: Not on file    Outpatient Medications Prior to Visit  Medication Sig Dispense Refill   albuterol (VENTOLIN HFA) 108 (90 Base) MCG/ACT inhaler Inhale 1 puff  into the lungs every 4 (four) hours. 18 g 3   albuterol (VENTOLIN HFA) 108 (90 Base) MCG/ACT inhaler Inhale 2 puffs into the lungs every 6 (six) hours as needed for wheezing or shortness of breath. 18 g 0   clindamycin (CLINDAGEL) 1 % gel APPLY A THIN LAYER TO THE AFFECTED AREA(S) BY TOPICAL ROUTE 2 TIMES PER DAY 30 g 3   cyclobenzaprine (FLEXERIL) 5 MG tablet Take 1 tablet (5 mg total) by mouth at bedtime as needed for muscle spasms. 30 tablet 1   diclofenac Sodium (VOLTAREN ARTHRITIS PAIN) 1 % GEL Apply 4 g topically 4 (four) times daily. 150 g 3   DULoxetine (CYMBALTA) 60 MG capsule Take 1 capsule (60 mg total) by mouth 2 (two) times daily. Take Cymbalta 60 mg in the morning and 30 mg in the evening for 14 days, before increasing to 60 mg twice a day 60 capsule 2   fluticasone (FLONASE) 50 MCG/ACT nasal spray Place 2 sprays into both  nostrils daily. 16 g 6   pantoprazole (PROTONIX) 40 MG tablet Take 1 tablet (40 mg total) by mouth daily. 30 tablet 1   pregabalin (LYRICA) 100 MG capsule Take 1 capsule (100 mg total) by mouth 2 (two) times daily. 100 capsule 3   propranolol (INDERAL) 10 MG tablet Take 1 tablet (10 mg total) by mouth 3 (three) times daily as needed (anxiety). 90 tablet 2   topiramate (TOPAMAX) 100 MG tablet Take 2 tablets (200 mg total) by mouth daily for seizure control 180 tablet 0   topiramate (TOPAMAX) 100 MG tablet Take 2 tablets by mouth every evening. 60 tablet 0   topiramate (TOPAMAX) 100 MG tablet Take 2 tablets (200 mg total) by mouth daily at night. 180 tablet 3   celecoxib (CELEBREX) 200 MG capsule Take 1 capsule (200 mg total) by mouth 2 (two) times daily. (Patient not taking: Reported on 05/06/2023) 30 capsule 0   No facility-administered medications prior to visit.    Allergies  Allergen Reactions   Bupropion Nausea And Vomiting and Other (See Comments)   Lamotrigine Rash    Rash on left side of face Rash on left side of face Rash on left side of face Rash on left side of face    Celexa [Citalopram Hydrobromide] Diarrhea and Nausea And Vomiting   Other     Dairy     Review of Systems  Constitutional:  Positive for malaise/fatigue. Negative for chills and fever.  HENT:  Positive for sore throat. Negative for congestion.   Respiratory:  Positive for cough, shortness of breath and wheezing.   Cardiovascular:  Negative for chest pain, palpitations and leg swelling.  Gastrointestinal:  Negative for abdominal pain, constipation, diarrhea, nausea and vomiting.  Genitourinary:  Negative for dysuria, frequency and urgency.  Neurological:  Negative for dizziness, focal weakness and headaches.  Psychiatric/Behavioral:  Positive for depression. The patient is nervous/anxious.        SI- denies plan or intent.        Objective:    Physical Exam Constitutional:      General: She is not in  acute distress.    Appearance: She is not ill-appearing.  HENT:     Nose: Nose normal.     Mouth/Throat:     Mouth: Mucous membranes are moist.     Pharynx: Posterior oropharyngeal erythema present.  Eyes:     Extraocular Movements: Extraocular movements intact.     Conjunctiva/sclera: Conjunctivae normal.  Cardiovascular:  Rate and Rhythm: Normal rate and regular rhythm.  Pulmonary:     Effort: Pulmonary effort is normal.     Breath sounds: Wheezing present.  Musculoskeletal:     Cervical back: Normal range of motion and neck supple. No tenderness.     Right lower leg: No edema.     Left lower leg: No edema.  Lymphadenopathy:     Cervical: No cervical adenopathy.  Skin:    General: Skin is warm and dry.  Neurological:     General: No focal deficit present.     Mental Status: She is alert and oriented to person, place, and time.     Motor: No weakness.     Coordination: Coordination normal.     Gait: Gait normal.  Psychiatric:        Attention and Perception: Attention normal.        Mood and Affect: Mood and affect normal.        Speech: Speech normal.        Behavior: Behavior normal.        Thought Content: Thought content does not include suicidal plan.      BP 104/76 (BP Location: Left Arm, Patient Position: Sitting, Cuff Size: Large)   Pulse 68   Temp 97.6 F (36.4 C) (Temporal)   Ht 5\' 4"  (1.626 m)   Wt 217 lb (98.4 kg)   SpO2 98%   BMI 37.25 kg/m  Wt Readings from Last 3 Encounters:  05/06/23 217 lb (98.4 kg)  04/08/23 219 lb (99.3 kg)  03/24/23 221 lb (100.2 kg)       Assessment & Plan:   Problem List Items Addressed This Visit       Respiratory   Asthma   Relevant Medications   fluticasone-salmeterol (ADVAIR) 100-50 MCG/ACT AEPB   predniSONE (DELTASONE) 20 MG tablet   Other Relevant Orders   DG Chest 2 View (Completed)     Other   GAD (generalized anxiety disorder)   Other Visit Diagnoses     Productive cough    -  Primary    Relevant Medications   predniSONE (DELTASONE) 20 MG tablet   Other Relevant Orders   DG Chest 2 View (Completed)   Wheezing       Relevant Medications   predniSONE (DELTASONE) 20 MG tablet      She stopped Celebrex 3 weeks ago when asthma flare started.  STAT chest X ray ordered. No red flag symptoms.  She reports being 75% improved since 3 weeks ago.  Oral steroids prescribed. Restart Advair. Continue albuterol prn.  She will follow up with Dr. Dimple Casey regarding pain.   I have discontinued Marcia Brash. Ayer's celecoxib. I am also having her start on fluticasone-salmeterol and predniSONE. Additionally, I am having her maintain her fluticasone, albuterol, clindamycin, topiramate, topiramate, topiramate, pantoprazole, propranolol, DULoxetine, diclofenac Sodium, pregabalin, cyclobenzaprine, and albuterol.  Meds ordered this encounter  Medications   fluticasone-salmeterol (ADVAIR) 100-50 MCG/ACT AEPB    Sig: Inhale 1 puff into the lungs 2 (two) times daily.    Dispense:  60 each    Refill:  1    Order Specific Question:   Supervising Provider    Answer:   Hillard Danker A [[id number]]   predniSONE (DELTASONE) 20 MG tablet    Sig: Take 2 tablets (40 mg total) by mouth daily with breakfast.    Dispense:  10 tablet    Refill:  0    Order Specific Question:   Supervising Provider  Answer:   Hillard Danker A [[id number]]

## 2023-05-07 ENCOUNTER — Other Ambulatory Visit (HOSPITAL_COMMUNITY): Payer: Self-pay

## 2023-05-13 ENCOUNTER — Ambulatory Visit: Payer: Medicaid Other | Attending: Internal Medicine | Admitting: Internal Medicine

## 2023-05-13 ENCOUNTER — Encounter: Payer: Self-pay | Admitting: Internal Medicine

## 2023-05-13 VITALS — BP 112/80 | HR 73 | Ht 64.0 in | Wt 223.0 lb

## 2023-05-13 DIAGNOSIS — M797 Fibromyalgia: Secondary | ICD-10-CM | POA: Diagnosis not present

## 2023-05-13 DIAGNOSIS — M7062 Trochanteric bursitis, left hip: Secondary | ICD-10-CM

## 2023-05-13 DIAGNOSIS — M722 Plantar fascial fibromatosis: Secondary | ICD-10-CM | POA: Diagnosis not present

## 2023-05-21 ENCOUNTER — Ambulatory Visit: Payer: Medicaid Other | Admitting: Podiatry

## 2023-05-25 ENCOUNTER — Encounter: Payer: Medicaid Other | Admitting: Physical Medicine & Rehabilitation

## 2023-05-26 ENCOUNTER — Encounter: Payer: Self-pay | Admitting: Physical Medicine & Rehabilitation

## 2023-05-26 ENCOUNTER — Encounter: Payer: Medicaid Other | Attending: Physical Medicine & Rehabilitation | Admitting: Physical Medicine & Rehabilitation

## 2023-05-26 VITALS — BP 103/70 | HR 85 | Ht 64.0 in | Wt 225.2 lb

## 2023-05-26 DIAGNOSIS — R252 Cramp and spasm: Secondary | ICD-10-CM | POA: Diagnosis present

## 2023-05-26 DIAGNOSIS — R4589 Other symptoms and signs involving emotional state: Secondary | ICD-10-CM | POA: Insufficient documentation

## 2023-05-26 DIAGNOSIS — M797 Fibromyalgia: Secondary | ICD-10-CM | POA: Diagnosis not present

## 2023-05-26 DIAGNOSIS — M722 Plantar fascial fibromatosis: Secondary | ICD-10-CM | POA: Insufficient documentation

## 2023-05-26 NOTE — Progress Notes (Signed)
Subjective:    Patient ID: Carolyn Miranda, female    DOB: [birth year], 32 y.o.   MRN: [id number]  HPI  HPI   Carolyn Miranda is a 32 y.o. year old female  who  has a past medical history of Allergies, Anxiety, Asthma, Depression, Migraines, Seizure (HCC), and Urine incontinence.   They are presenting to PM&R clinic as a new patient for pain management evaluation. They were referred by Dr. Dimple Casey rheumatology for treatment of Fibromyalgia pain. Diagnosed with fibromyalgia December 2023.   She has had pain for several years until her PCP who referred her to Rheumatology Dr. Dimple Casey who diagnosed her with fibromyalgia. Her grandmother had fibromyalgia. 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 Her grandmother passed in February and she had anouther family member pass more recently.  Her mood has been decreased due to the losses in her family.  Standing for a long time worsens her pain.  Most of her pain is in her lower extremities.  She is also having a lot of pain in her left shoulder. She also has pelvic floor pain and will follow-up with gynecology. She has a lot of pain in her lower back. She does have pain throughout her body. She has a hard time getting comfortable and often has to change position. She also has a lot of pain in her feet and toes.   She has hx of depression and poor sleep. She is following with psychiatry. Recently her cymbalta dose was increased.      Red flag symptoms: No red flags for back pain endorsed in Hx or ROS   Medications tried: Topical medications- she uses icy hot sometimes Nsaids-does not take due to use of cymbalta  Tylenol  - denies benefit  Opiates - Denies Gabapentin / Lyrica - denies  TCAs denies  SNRIs  Cymbalta- helps her pain  Flexeril-  CBD,HTC gummies-she says these are purchased we will   Other treatments: PT- Tried last year, limited by cost in  past Chiropractor- helps TENs unit - helped when tried at chriopractor Injections - dry needling helped once Surgery- Denies      Interval History 03/24/23 Carolyn Miranda is here for follow-up regarding her chronic pain.  She reports multiple areas of stress going on including issues at work, loose bowel movements.  She is trying to modify her diet to help with loose bowel movements.  She does report a couple episodes where she had word finding difficulties.  She reports she had similar incidences when she first started taking topiramate and she has a follow-up with neurology scheduled.  She reports she continues to have body wide pain that limits her activities, although it has improved with initiation of Lyrica.  She has not yet been able to get a TENS unit due to cost.   Interval History 05/26/23 Carolyn Miranda is here for follow-up regarding her chronic fibromyalgia pain.  She reports she is hurting a lot this week.  She states she did more walking over the weekend than usual and has had more pain because of this.  She says she was in bed the day after she completed this activity.  She reports that Dr. Dimple Casey rheumatology had started her on Celebrex which was helping left hip trochanteric bursitis and plantar fasciitis of her feet.  She also referred her to podiatry.  Unfortunately she developed some worsening  of her asthma and had to discontinue this medication.  She reports her PCP gave her prednisone for several days due to her asthma.  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   Pain Inventory Average Pain 7 Pain Right Now 5 My pain is sharp, burning, stabbing, and aching  In the last 24 hours, has pain interfered with the following? General activity 10 Relation with others 8 Enjoyment of life 10 What TIME of day is your pain at its worst? morning , evening, and night Sleep (in general) Poor  Pain is worse  with: walking, bending, and standing Pain improves with: rest, heat/ice, medication, and TENS Relief from Meds: 5  Family History  Problem Relation Age of Onset   Mental illness Mother    Drug abuse Mother        overmedicating rx by psych   Miscarriages / [place] Mother    Depression Mother    Arthritis Mother    Diabetes Father    Arthritis Maternal Grandmother    Asthma Maternal Grandmother    Social History   Socioeconomic History   Marital status: Single    Spouse name: Not on file   Number of children: Not on file   Years of education: Not on file   Highest education level: Not on file  Occupational History   Not on file  Tobacco Use   Smoking status: Never    Passive exposure: Current   Smokeless tobacco: Never  Vaping Use   Vaping status: Never Used  Substance and Sexual Activity   Alcohol use: Not Currently    Comment: socially   Drug use: No   Sexual activity: Not on file  Other Topics Concern   Not on file  Social History Narrative   Not on file   Social Determinants of Health   Financial Resource Strain: Not on file  Food Insecurity: Not on file  Transportation Needs: Not on file  Physical Activity: Not on file  Stress: Not on file  Social Connections: Not on file   Past Surgical History:  Procedure Laterality Date   BREAST REDUCTION SURGERY Bilateral 07/08/2017   WISDOM TOOTH EXTRACTION     Past Surgical History:  Procedure Laterality Date   BREAST REDUCTION SURGERY Bilateral 07/08/2017   WISDOM TOOTH EXTRACTION     Past Medical History:  Diagnosis Date   Allergies    Anxiety    Asthma    Depression    Migraines    Seizure (HCC)    Urine incontinence    BP 103/70   Pulse 85   Ht 5\' 4"  (1.626 m)   Wt 225 lb 3.2 oz (102.2 kg)   SpO2 94%   BMI 38.66 kg/m   Opioid Risk Score:   Fall Risk Score:  `1  Depression screen [facility] 2/9     05/26/2023   11:23 AM 05/06/2023    1:07 PM 03/24/2023   10:16 AM 12/19/2022    2:57 PM  11/21/2022    4:26 PM 05/05/2022    1:58 PM  Depression screen PHQ 2/9  Decreased Interest 2 2 3 1  0   Down, Depressed, Hopeless 2 1 1 2  0   PHQ - 2 Score 4 3 4 3  0   Altered sleeping  2  2    Tired, decreased energy  3  3    Change in appetite  1  3    Feeling bad or failure about yourself   3  2    Trouble concentrating  1  1    Moving slowly or fidgety/restless  0  1    Suicidal thoughts  1  1    PHQ-9 Score  14  16    Difficult doing work/chores    Very difficult       Information is confidential and restricted. Go to Review Flowsheets to unlock data.     Review of Systems  Constitutional: Negative.   HENT: Negative.    Eyes: Negative.   Respiratory: Negative.    Cardiovascular: Negative.   Gastrointestinal: Negative.   Endocrine: Negative.   Genitourinary: Negative.   Musculoskeletal:  Positive for gait problem.       Left hip pain and left foot  Skin: Negative.   Allergic/Immunologic: Negative.   Hematological: Negative.   Psychiatric/Behavioral:  Positive for dysphoric mood.   All other systems reviewed and are negative.      Objective:   Physical Exam   Gen: no distress, normal appearing HEENT: oral mucosa pink and moist, NCAT Cardio: Reg rate Chest: normal effort, normal rate of breathing Abd: soft, non-distended Ext: no edema Psych: pleasant, normal affect Skin: intact Neuro: Alert and awake, follows commands, cranial nerves II through XII grossly intact, normal speech and language No focal motor deficits noted Sensation intact light touch in all 4 extremities No dysmetria or ataxia noted Musculoskeletal:  Patient is tender to palpation at her lumbar spine paraspinal muscles, bilateral plantar feet (left greater than right), ankles, shoulders, wrists Tenderness bilateral hip over greater trochanter left greater than right        Assessment & Plan:     Fibromyalgia -Continue Voltaren gel, oral NSAIDs limited by use of antidepressants -She  uses CBD Gummies with benefit reported -Zynex nexwave -  she plans to try this when finances allow -Discussed low impact progressive exercise again -Aquatic therapy referral placed -Prior visit she was provided list of foods that can be helpful for pain, consider trying turmeric-again today -Continue Flexeril as needed, she primarily uses this at night -Continue Lyrica two 100mg  twice daily -Currently on duloxetine current dose -She has had some loose bowel movements, this may be IBS -Could consider trochanteric bursitis injection for the left hip.  Agree with Dr. Dimple Casey on holding off on this for now.   Plantar fasciitis -Discussed stretches for heel cord -She has been referred to podiatry for rheumatology, until she sees podiatry she could try over-the-counter orthotic insoles   Muscle cramps -Advised to try magnesium    Anxiety and depressed mood -Denies SI or HI.  She follows with psychiatry and is on duloxetine

## 2023-05-27 ENCOUNTER — Encounter (HOSPITAL_BASED_OUTPATIENT_CLINIC_OR_DEPARTMENT_OTHER): Payer: Self-pay | Admitting: Physical Therapy

## 2023-05-27 ENCOUNTER — Other Ambulatory Visit: Payer: Self-pay

## 2023-05-27 ENCOUNTER — Ambulatory Visit (HOSPITAL_BASED_OUTPATIENT_CLINIC_OR_DEPARTMENT_OTHER): Payer: Medicaid Other | Attending: Internal Medicine | Admitting: Physical Therapy

## 2023-05-27 DIAGNOSIS — M7062 Trochanteric bursitis, left hip: Secondary | ICD-10-CM | POA: Diagnosis not present

## 2023-05-27 DIAGNOSIS — R2689 Other abnormalities of gait and mobility: Secondary | ICD-10-CM | POA: Diagnosis present

## 2023-05-27 DIAGNOSIS — G8929 Other chronic pain: Secondary | ICD-10-CM | POA: Diagnosis not present

## 2023-05-27 DIAGNOSIS — M6281 Muscle weakness (generalized): Secondary | ICD-10-CM | POA: Diagnosis present

## 2023-05-27 DIAGNOSIS — M797 Fibromyalgia: Secondary | ICD-10-CM

## 2023-05-27 NOTE — Therapy (Signed)
OUTPATIENT PHYSICAL THERAPY LOWER EXTREMITY EVALUATION   Patient Name: Carolyn Miranda MRN: [id number] DOB:[birth year], 32 y.o., female Today's Date: 05/27/2023  END OF SESSION:  PT End of Session - 05/27/23 1151     Visit Number 1    Number of Visits 12    Date for PT Re-Evaluation 07/24/23    PT Start Time 0815    PT Stop Time 0900    PT Time Calculation (min) 45 min    Activity Tolerance Patient tolerated treatment well    Behavior During Therapy [facility] for tasks assessed/performed             Past Medical History:  Diagnosis Date   Allergies    Anxiety    Asthma    Depression    Migraines    Seizure (HCC)    Urine incontinence    Past Surgical History:  Procedure Laterality Date   BREAST REDUCTION SURGERY Bilateral 07/08/2017   WISDOM TOOTH EXTRACTION     Patient Active Problem List   Diagnosis Date Noted   Trochanteric bursitis, left hip 04/08/2023   Plantar fasciitis of left foot 04/08/2023   Myofascial pain 10/06/2022   Gastroesophageal reflux disease 06/04/2022   Social anxiety disorder 05/05/2022   GAD (generalized anxiety disorder) 05/05/2022   Antinuclear antibody (ANA) titer greater than 1:80 04/08/2022   Back pain 04/02/2022   Fibromyalgia 04/02/2022   Pelvic pain 04/02/2022   Bilateral hand pain 04/02/2022   Seizures (HCC) 11/02/2019   Encounter for monitoring anticonvulsant therapy 11/01/2019   Allergic rhinitis 03/26/2018   Neck pain 12/27/2015   Encounter for birth control 08/24/2015   Asthma 04/20/2015   Adjustment disorder with mixed anxiety and depressed mood 01/16/2015   Obesity (BMI 30-39.9) 01/16/2015    PCP: Hetty Blend NP-C  REFERRING PROVIDER: Fuller Plan, MD Fanny Dance, MD   REFERRING DIAG: Fibromyalgia   THERAPY DIAG:  Fibromyalgia  Muscle weakness (generalized)  Other abnormalities of gait and mobility  Rationale for Evaluation and Treatment: Rehabilitation  ONSET DATE: 1 year dx  SUBJECTIVE:    SUBJECTIVE STATEMENT: Epilepsy dx 2021.  Hasn't had any seizures since then. I have had symptoms for many years. Hypotonic as a baby therapy for 3 years.  Walking is a struggle.  PERTINENT HISTORY: -Diagnosed with fibromyalgia December 2023 LE;LB;L shoulder, pelvic floor -Celebrex which was helping left hip trochanteric bursitis and plantar fasciitis of her feet  PAIN:  Are you having pain? Yes: NPRS scale: 4/10 worst 6/10; least 3/10 Pain location: hips;LB Pain description: sharp, burning, stabbing, and aching constant Aggravating factors: standing >15; sitting extended periods time; walking >15 minutes Relieving factors: ice/heat; CBD gummies.  PRECAUTIONS: None  RED FLAGS: None   WEIGHT BEARING RESTRICTIONS: No  FALLS:  Has patient fallen in last 6 months? Yes 1 ankle rolled  LIVING ENVIRONMENT: Lives with: lives with their family Lives in: House/apartment Stairs: Yes: External: 4 steps; on right going up Has following equipment at home: None  OCCUPATION: cashier  PLOF: Independent  PATIENT GOALS: help hypermobility, learn to exercises, manage my pain  NEXT MD VISIT: 06/17/23  OBJECTIVE:  Note: Objective measures were completed at Evaluation unless otherwise noted.  DIAGNOSTIC FINDINGS: not recent  PATIENT SURVEYS:  LEFS 34/80  COGNITION: Overall cognitive status: Within functional limits for tasks assessed     SENSATION: wfl   MUSCLE LENGTH:    POSTURE: rounded shoulders, posterior pelvic tilt, and flexed trunk    LUMBAR ROM:   Hypermobile  LOWER  EXTREMITY ROM:  Active ROM Right eval Left eval  Hip flexion    Hip extension    Hip abduction    Hip adduction    Hip internal rotation    Hip external rotation    Knee flexion    Knee extension hyper hyper  Ankle dorsiflexion    Ankle plantarflexion    Ankle inversion    Ankle eversion     (Blank rows = not tested)  LOWER EXTREMITY MMT:  MMT Right eval Left eval  Hip flexion  52.5 46.5  Hip extension    Hip abduction 40.7 29.1  Hip adduction    Hip internal rotation    Hip external rotation    Knee flexion    Knee extension 44.1 41.6  Ankle dorsiflexion    Ankle plantarflexion    Ankle inversion    Ankle eversion     (Blank rows = not tested)   FUNCTIONAL TESTS:  5 times sit to stand: 24.80 Timed up and go (TUG): 11.25 4 stages:pass  GAIT: Distance walked: 500 ft Assistive device utilized: None Level of assistance: Complete Independence Comments: wfl   TODAY'S TREATMENT:                                                                                                                              Eval Testing Edu   PATIENT EDUCATION:  Education details: Discussed eval findings, rehab rationale, aquatic program progression/POC and pools in area. Patient is in agreement  Person educated: Patient Education method: Explanation Education comprehension: verbalized understanding  HOME EXERCISE PROGRAM: TBA  ASSESSMENT:  CLINICAL IMPRESSION: Patient is a 32 y.o. f who was seen today for physical therapy evaluation and treatment for Fibromyalgia . She presents today with her mother. Pt has multiple co-morbidities which are chronic and pain inducing for which she receiving care for from [facility] specialties. She reports chronic pain back to when she was a child but just recently dx with fibromyalgia.  Main complaint today is overall pain which she is wanting to better manage as well as gain strength to have improved toleration to all activity.  Her pain sensitivity is moderate and constant. She reports limitations with walking and standing as well as sitting in one position too long. Testing demonstrates hypermobility throughout, marked in knees, elbows and spine. She has strength deficits L>R.  OBJECTIVE IMPAIRMENTS: decreased endurance, decreased mobility, difficulty walking, decreased strength, obesity, and hypermobility .   ACTIVITY LIMITATIONS:  carrying, lifting, bending, sitting, standing, squatting, and stairs  PARTICIPATION LIMITATIONS: community activity, occupation, and yard work  PERSONAL FACTORS: Age, Fitness, Past/current experiences, Time since onset of injury/illness/exacerbation, and 3+ comorbidities: see PMHx  are also affecting patient's functional outcome.   REHAB POTENTIAL: Good  CLINICAL DECISION MAKING: Evolving/moderate complexity  EVALUATION COMPLEXITY: Moderate   GOALS: Goals reviewed with patient? Yes  SHORT TERM GOALS: Target date: 06/17/23 Pt will tolerate full aquatic sessions consistently without increase in pain and  with improving function to demonstrate good toleration and effectiveness of intervention.  Baseline: Goal status: INITIAL  2.  Pt will understand the benefits of aquatic therapy/intervention on long term pain management of chronic conditions and able to find pool access  Baseline:  Goal status: INITIAL  3.  Pt will tolerate walking to and from setting and engaging in aquatic therapy session without excessive fatigue or increase in pain to demonstrate improved toleration to activity.  Baseline:  Goal status: INITIAL    LONG TERM GOALS: Target date: 07/24/23  Pt will improve by at least 9 points on LEFS to demonstrate improved function Baseline:34/80  Goal status: INITIAL  2.  Pt will be indep with final HEP's (land and aquatic as appropriate) for continued management of condition Baseline:  Goal status: INITIAL  3.  Pt will improve strength of LLE to be with in 5 lb of contralateral side to demonstrate improved overall physical function Baseline:  Goal status: INITIAL  4.  Pt will improve on 5 X STS test to <or= 16s to demonstrate improving functional lower extremity strength, transitional movements, and balance Baseline: 24.80 Goal status: INITIAL  5.  Pt will report overall reduction in pain to by at least 2 NPRS for improved toleration to activity/quality of life and  to demonstrate improved management of pain. Baseline: see chart Goal status: INITIAL   PLAN:  PT FREQUENCY: 1-2x/week  PT DURATION: 8 weeks 12 visits  PLANNED INTERVENTIONS: 97164- PT Re-evaluation, 97110-Therapeutic exercises, 97530- Therapeutic activity, 97112- Neuromuscular re-education, 97535- Self Care, 04540- Manual therapy, [phone]- Gait training, [phone]- Orthotic Fit/training, [phone]- Aquatic Therapy, [phone]- Ionotophoresis 4mg /ml Dexamethasone, Patient/Family education, Balance training, Stair training, Taping, Dry Needling, Joint mobilization, DME instructions, Cryotherapy, and Moist heat  PLAN FOR NEXT SESSION: aquatic: pain management; strengthening general; aerobic capacity training,posture and balance; HEP Land: HEP for core and LE strengthening, posture retraining.  HEP; Be mindful of hypermobility 1-2 visits   Rushie Chestnut) Josalynn Johndrow MPT 05/27/23 12:26 PM [facility] [address] [place], [place], [address] Phone: [phone]   Fax:  [phone]   For all possible CPT codes, reference the Planned Interventions line above.     Check all conditions that are expected to impact treatment: {Conditions expected to impact treatment:Musculoskeletal disorders and Neurological condition and/or seizures   If treatment provided at initial evaluation, no treatment charged due to lack of authorization.

## 2023-05-27 NOTE — Progress Notes (Unsigned)
BH MD/PA/NP OP Progress Note  05/28/2023 12:52 PM Carolyn Miranda  MRN:  [id number]  Visit Diagnosis:    ICD-10-CM   1. Social anxiety disorder  F40.10 DULoxetine (CYMBALTA) 60 MG capsule    DULoxetine (CYMBALTA) 30 MG capsule    propranolol (INDERAL) 10 MG tablet    2. GAD (generalized anxiety disorder)  F41.1 DULoxetine (CYMBALTA) 60 MG capsule    DULoxetine (CYMBALTA) 30 MG capsule    propranolol (INDERAL) 10 MG tablet    3. Fibromyalgia  M79.7 DULoxetine (CYMBALTA) 60 MG capsule    DULoxetine (CYMBALTA) 30 MG capsule      Assessment: Carolyn Miranda is a 32 y.o. female with a history of anxiety, a seizure disorder, and chronic pain who presented to [facility] at [facility] for initial evaluation on 05/05/22.    During initial evaluation patient reported symptoms of anxiety including constant worry, racing thoughts, ruminations on past interactions, feeling overwhelmed, and impending dread for social interactions.  Patient endorsed experiencing restlessness secondary to her anxiety.  Patient denied any history of mania, psychosis, paranoia, SI/HI, or delusions.  Patient questioned diagnosis of ADHD and reported having done well in both school growing up and work more recently.  Of note patient was diagnosed with a seizure disorder in 2021 and there is concern of a possible autoimmune condition due to her elevated ANA.  She is scheduled with a rheumatologist for this and her chronic pain in December.  Patient met criteria for diagnosis of generalized anxiety disorder and social anxiety disorder and would benefit from medication management and connection with therapy.  While diagnosis of ADHD cannot be ruled out it does seem less likely as potential symptoms are more likely to be related to her anxiety.  [name] presents for follow-up evaluation. Today, 05/28/23, patient had some ups and downs in regards to her anxiety in the interim. She left her job  which resolved a lot of her stress secondary to that, though now has increased financial stressors. The lack of structure has also led to some intermittent increases in depression.  Patient did not increase Cymbalta as previously discussed though has found the medication beneficial.  We will titrate the medication to 30 mg at bedtime for 7 days before increasing to 60 mg twice daily.  We will continue on the remainder of her current regimen and follow-up in a month.  Plan: - Increase Cymbalta to 60 mg QD and 30 mg QHS for 7 days before increasing to 60 mg BID - Continue propranolol to 10 mg TID prn for anxiety - Continue Topamax 200 mg QD for seizures managed by her neurologist - CBC, CMP, TSH, ANA reviewed - Therapy referral, patient will reach out again - Follow up in a month  Chief Complaint:  Chief Complaint  Patient presents with   Follow-up   HPI: Patient presents reporting that since her last appointment she lost her job. Due to her chronic pain she ended up having a doctors appointment and missed a day of work. The pain continued to bother her and she showed up a couple minutes late another day. After this encounter Carolyn Miranda had had enough of the job and walked out. While she was not necessarily quitting her boss interpreted it as that and let her go. She did expect it to happen, but was hoping to find a new job first. This happened on September 13. While she has had the stress of not having a job, everything else  seems a bit less stressful since. There have been some days where she has been sleeping more which have made her feel more depressed.  Financially it has been hard. 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 Carolyn Miranda is hopeful as the interviews seemed to go well and the job itself seems up her alley.  In regards to medication she has felt like the Cymbalta helps though the benefit can fluctuate.  Upon review the days it seems to help less  are often when there are other stressors ongoing.  She has found days that she forgets to take the medication are significantly worse.  Of note patient did not increase the medication as initially planned at last appointment.  That said we will increase the medication today as we had discussed previously.  As for the propranolol patient been taking it as needed for anxiety with good effect.  Since leaving her former employer she has not needed it as frequently.  We did review the no-show policy with the patient informing her that after 3 no-shows she would be dismissed from the practice.  Patient expressed her understanding.  Past Psychiatric History: Has been on medications in the past. Has tried Wellbutrin, Zoloft, possibly Prozac. Lamictal got a rash. Tried BuSpar which she think helped some.  Patient had a therapist in the past which she thought was helpful.  Increase Cymbalta to 60 mg daily on 06/04/2022.  Past Medical History:  Past Medical History:  Diagnosis Date   Allergies    Anxiety    Asthma    Depression    Migraines    Seizure (HCC)    Urine incontinence     Past Surgical History:  Procedure Laterality Date   BREAST REDUCTION SURGERY Bilateral 07/08/2017   WISDOM TOOTH EXTRACTION      Family Psychiatric History: Denies  Family History:  Family History  Problem Relation Age of Onset   Mental illness Mother    Drug abuse Mother        overmedicating rx by psych   Miscarriages / [place] Mother    Depression Mother    Arthritis Mother    Diabetes Father    Arthritis Maternal Grandmother    Asthma Maternal Grandmother     Social History:  Social History   Socioeconomic History   Marital status: Single    Spouse name: Not on file   Number of children: Not on file   Years of education: Not on file   Highest education level: Not on file  Occupational History   Not on file  Tobacco Use   Smoking status: Never    Passive exposure: Current   Smokeless  tobacco: Never  Vaping Use   Vaping status: Never Used  Substance and Sexual Activity   Alcohol use: Not Currently    Comment: socially   Drug use: No   Sexual activity: Not on file  Other Topics Concern   Not on file  Social History Narrative   Not on file   Social Determinants of Health   Financial Resource Strain: Not on file  Food Insecurity: Not on file  Transportation Needs: Not on file  Physical Activity: Not on file  Stress: Not on file  Social Connections: Not on file    Allergies:  Allergies  Allergen Reactions   Bupropion Nausea And Vomiting and Other (See Comments)   Celebrex [Celecoxib] Other (See Comments)    Caused wheezing  Lamotrigine Rash    Rash on left side of face Rash on left side of face Rash on left side of face Rash on left side of face    Celexa [Citalopram Hydrobromide] Diarrhea and Nausea And Vomiting   Other     Dairy     Current Medications: Current Outpatient Medications  Medication Sig Dispense Refill   DULoxetine (CYMBALTA) 30 MG capsule Take 1 capsule (30 mg total) by mouth daily. Take in the evening for 7 days before starting the 60 mg dose 7 capsule 0   albuterol (VENTOLIN HFA) 108 (90 Base) MCG/ACT inhaler Inhale 1 puff into the lungs every 4 (four) hours. 18 g 3   albuterol (VENTOLIN HFA) 108 (90 Base) MCG/ACT inhaler Inhale 2 puffs into the lungs every 6 (six) hours as needed for wheezing or shortness of breath. 18 g 0   clindamycin (CLINDAGEL) 1 % gel APPLY A THIN LAYER TO THE AFFECTED AREA(S) BY TOPICAL ROUTE 2 TIMES PER DAY 30 g 3   cyclobenzaprine (FLEXERIL) 5 MG tablet Take 1 tablet (5 mg total) by mouth at bedtime as needed for muscle spasms. 30 tablet 1   diclofenac Sodium (VOLTAREN ARTHRITIS PAIN) 1 % GEL Apply 4 g topically 4 (four) times daily. 150 g 3   DULoxetine (CYMBALTA) 60 MG capsule Take 1 capsule (60 mg total) by mouth 2 (two) times daily. Take 60 mg in the morning and 30 mg capsule for 7 days before  increasing to 60 mg 2 times daily 60 capsule 2   fluticasone (FLONASE) 50 MCG/ACT nasal spray Place 2 sprays into both nostrils daily. 16 g 6   fluticasone-salmeterol (ADVAIR) 100-50 MCG/ACT AEPB Inhale 1 puff into the lungs 2 (two) times daily. 60 each 1   pantoprazole (PROTONIX) 40 MG tablet Take 1 tablet (40 mg total) by mouth daily. 30 tablet 1   predniSONE (DELTASONE) 20 MG tablet Take 2 tablets (40 mg total) by mouth daily with breakfast. 10 tablet 0   pregabalin (LYRICA) 100 MG capsule Take 1 capsule (100 mg total) by mouth 2 (two) times daily. 100 capsule 3   propranolol (INDERAL) 10 MG tablet Take 1 tablet (10 mg total) by mouth 3 (three) times daily as needed (anxiety). 90 tablet 2   topiramate (TOPAMAX) 100 MG tablet Take 2 tablets (200 mg total) by mouth daily at night. 180 tablet 3   No current facility-administered medications for this visit.     Musculoskeletal: Strength & Muscle Tone: within normal limits Gait & Station: normal Patient leans: N/A  Psychiatric Specialty Exam: Review of Systems  There were no vitals taken for this visit.There is no height or weight on file to calculate BMI.  General Appearance: Fairly Groomed  Eye Contact:  Good  Speech:  Clear and Coherent and Normal Rate  Volume:  Normal  Mood:  Anxious and Euthymic  Affect:  Constricted  Thought Process:  Coherent and Linear  Orientation:  Full (Time, Place, and Person)  Thought Content: Logical   Suicidal Thoughts:  No  Homicidal Thoughts:  No  Memory:  Immediate;   Good  Judgement:  Good  Insight:  Fair  Psychomotor Activity:  Normal  Concentration:  Concentration: Good  Recall:  Good  Fund of Knowledge: Good  Language: Good  Akathisia:  NA    AIMS (if indicated): not done  Assets:  Communication Skills Desire for Improvement Housing Social Support  ADL's:  Intact  Cognition: WNL  Sleep:  Good   Metabolic  Disorder Labs: Lab Results  Component Value Date   HGBA1C 4.7 01/16/2015    No results found for: "PROLACTIN" Lab Results  Component Value Date   CHOL 204 (H) 01/16/2015   TRIG 358.0 (H) 01/16/2015   HDL 47.80 01/16/2015   CHOLHDL 4 01/16/2015   VLDL 71.6 (H) 01/16/2015   Lab Results  Component Value Date   TSH 2.39 04/02/2022   TSH 4.54 (H) 01/16/2015    Therapeutic Level Labs: No results found for: "LITHIUM" No results found for: "VALPROATE" No results found for: "CBMZ"   Screenings: PHQ2-9    Flowsheet Row Office Visit from 05/26/2023 in [facility] Office Visit from 05/06/2023 in [facility] [place] Office Visit from 03/24/2023 in [facility] Office Visit from 12/19/2022 in [facility] Office Visit from 11/21/2022 in [facility]  PHQ-2 Total Score 4 3 4 3  0  PHQ-9 Total Score -- 14 -- 16 --       Collaboration of Care: Collaboration of Care: Medication Management AEB medication prescription and Other provider involved in patient's care AEB internal med and rheumatology chart review  Patient/Guardian was advised Release of Information must be obtained prior to any record release in order to collaborate their care with an outside provider. Patient/Guardian was advised if they have not already done so to contact the registration department to sign all necessary forms in order for [place] to release information regarding their care.   Consent: Patient/Guardian gives verbal consent for treatment and assignment of benefits for services provided during this visit. Patient/Guardian expressed understanding and agreed to proceed.    Virtual Visit via Video Note  I connected with Olena Mater on 05/28/23 at 11:00 AM EDT by a video enabled telemedicine application and verified that I am speaking with the correct person using two identifiers.  Location: Patient: Home Provider: Home Office   I  discussed the limitations of evaluation and management by telemedicine and the availability of in person appointments. The patient expressed understanding and agreed to proceed.   I discussed the assessment and treatment plan with the patient. The patient was provided an opportunity to ask questions and all were answered. The patient agreed with the plan and demonstrated an understanding of the instructions.   The patient was advised to call back or seek an in-person evaluation if the symptoms worsen or if the condition fails to improve as anticipated.  I provided 30 minutes of non-face-to-face time during this encounter.   Stasia Cavalier, MD    Stasia Cavalier, MD 05/28/2023, 12:52 PM

## 2023-05-28 ENCOUNTER — Other Ambulatory Visit: Payer: Self-pay

## 2023-05-28 ENCOUNTER — Telehealth (HOSPITAL_COMMUNITY): Payer: Medicaid Other | Admitting: Psychiatry

## 2023-05-28 ENCOUNTER — Encounter (HOSPITAL_COMMUNITY): Payer: Self-pay | Admitting: Psychiatry

## 2023-05-28 ENCOUNTER — Other Ambulatory Visit (HOSPITAL_COMMUNITY): Payer: Self-pay

## 2023-05-28 DIAGNOSIS — F4010 Social phobia, unspecified: Secondary | ICD-10-CM

## 2023-05-28 DIAGNOSIS — M797 Fibromyalgia: Secondary | ICD-10-CM

## 2023-05-28 DIAGNOSIS — F411 Generalized anxiety disorder: Secondary | ICD-10-CM

## 2023-05-28 MED ORDER — DULOXETINE HCL 30 MG PO CPEP
30.0000 mg | ORAL_CAPSULE | Freq: Every day | ORAL | 0 refills | Status: DC
Start: 2023-05-28 — End: 2023-06-30
  Filled 2023-05-28: qty 7, 7d supply, fill #0

## 2023-05-28 MED ORDER — PROPRANOLOL HCL 10 MG PO TABS
10.0000 mg | ORAL_TABLET | Freq: Three times a day (TID) | ORAL | 2 refills | Status: AC | PRN
  Filled 2023-05-28 – 2023-10-15 (×2): qty 90, 30d supply, fill #0

## 2023-05-28 MED ORDER — DULOXETINE HCL 60 MG PO CPEP
60.0000 mg | ORAL_CAPSULE | Freq: Two times a day (BID) | ORAL | 2 refills | Status: DC
  Filled 2023-05-28 – 2023-07-01 (×2): qty 60, 30d supply, fill #0
  Filled 2023-08-16: qty 60, 30d supply, fill #1

## 2023-06-04 ENCOUNTER — Other Ambulatory Visit (HOSPITAL_COMMUNITY): Payer: Self-pay

## 2023-06-04 ENCOUNTER — Other Ambulatory Visit: Payer: Self-pay

## 2023-06-09 ENCOUNTER — Ambulatory Visit (HOSPITAL_BASED_OUTPATIENT_CLINIC_OR_DEPARTMENT_OTHER): Payer: Medicaid Other | Attending: Internal Medicine | Admitting: Physical Therapy

## 2023-06-09 ENCOUNTER — Encounter (HOSPITAL_BASED_OUTPATIENT_CLINIC_OR_DEPARTMENT_OTHER): Payer: Self-pay | Admitting: Physical Therapy

## 2023-06-09 DIAGNOSIS — M797 Fibromyalgia: Secondary | ICD-10-CM | POA: Diagnosis present

## 2023-06-09 DIAGNOSIS — R2689 Other abnormalities of gait and mobility: Secondary | ICD-10-CM | POA: Diagnosis present

## 2023-06-09 DIAGNOSIS — M6281 Muscle weakness (generalized): Secondary | ICD-10-CM | POA: Diagnosis present

## 2023-06-09 NOTE — Therapy (Signed)
OUTPATIENT PHYSICAL THERAPY LOWER EXTREMITY TREATMENT   Patient Name: Carolyn Miranda MRN: [id number] DOB:08-Jul-1991, 32 y.o., female Today's Date: 06/09/2023  END OF SESSION:  PT End of Session - 06/09/23 0959     Visit Number 2    Number of Visits 12    Date for PT Re-Evaluation 07/24/23    PT Start Time 0948    PT Stop Time 1026    PT Time Calculation (min) 38 min    Activity Tolerance Patient tolerated treatment well    Behavior During Therapy [facility] for tasks assessed/performed             Past Medical History:  Diagnosis Date   Allergies    Anxiety    Asthma    Depression    Migraines    Seizure (HCC)    Urine incontinence    Past Surgical History:  Procedure Laterality Date   BREAST REDUCTION SURGERY Bilateral 07/08/2017   WISDOM TOOTH EXTRACTION     Patient Active Problem List   Diagnosis Date Noted   Trochanteric bursitis, left hip 04/08/2023   Plantar fasciitis of left foot 04/08/2023   Myofascial pain 10/06/2022   Gastroesophageal reflux disease 06/04/2022   Social anxiety disorder 05/05/2022   GAD (generalized anxiety disorder) 05/05/2022   Antinuclear antibody (ANA) titer greater than 1:80 04/08/2022   Back pain 04/02/2022   Fibromyalgia 04/02/2022   Pelvic pain 04/02/2022   Bilateral hand pain 04/02/2022   Seizures (HCC) 11/02/2019   Encounter for monitoring anticonvulsant therapy 11/01/2019   Allergic rhinitis 03/26/2018   Neck pain 12/27/2015   Encounter for birth control 08/24/2015   Asthma 04/20/2015   Adjustment disorder with mixed anxiety and depressed mood 01/16/2015   Obesity (BMI 30-39.9) 01/16/2015    PCP: Hetty Blend NP-C  REFERRING PROVIDER: Fuller Plan, MD Fanny Dance, MD   REFERRING DIAG: Fibromyalgia   THERAPY DIAG:  Fibromyalgia  Muscle weakness (generalized)  Other abnormalities of gait and mobility  Rationale for Evaluation and Treatment: Rehabilitation  ONSET DATE: 1 year dx  SUBJECTIVE:    SUBJECTIVE STATEMENT: Pt reports that she has started a part-time job at an [employer] where she is on her feet and moving around.  She reports she is struggling with it; back is very painful after 2-3hrs.   PERTINENT HISTORY: -Diagnosed with fibromyalgia December 2023 LE;LB;L shoulder, pelvic floor -Celebrex which was helping left hip trochanteric bursitis and plantar fasciitis of her feet  PAIN:  Are you having pain? Yes: NPRS scale: 4/10  Pain location:  lower back Pain description: sharp, burning, stabbing, and aching constant Aggravating factors: standing >15; sitting extended periods time; walking >15 minutes Relieving factors: ice/heat; CBD gummies.  PRECAUTIONS: None  RED FLAGS: None   WEIGHT BEARING RESTRICTIONS: No  FALLS:  Has patient fallen in last 6 months? Yes 1 ankle rolled  LIVING ENVIRONMENT: Lives with: lives with their family Lives in: House/apartment Stairs: Yes: External: 4 steps; on right going up Has following equipment at home: None  OCCUPATION: cashier  PLOF: Independent  PATIENT GOALS: help hypermobility, learn to exercises, manage my pain  NEXT MD VISIT: 06/17/23  OBJECTIVE:  Note: Objective measures were completed at Evaluation unless otherwise noted.  DIAGNOSTIC FINDINGS: not recent  PATIENT SURVEYS:  LEFS 34/80  COGNITION: Overall cognitive status: Within functional limits for tasks assessed     SENSATION: wfl   MUSCLE LENGTH:    POSTURE: rounded shoulders, posterior pelvic tilt, and flexed trunk    LUMBAR  ROM:   Hypermobile  LOWER EXTREMITY ROM:  Active ROM Right eval Left eval  Hip flexion    Hip extension    Hip abduction    Hip adduction    Hip internal rotation    Hip external rotation    Knee flexion    Knee extension hyper hyper  Ankle dorsiflexion    Ankle plantarflexion    Ankle inversion    Ankle eversion     (Blank rows = not tested)  LOWER EXTREMITY MMT:  MMT Right eval Left eval  Hip  flexion 52.5 46.5  Hip extension    Hip abduction 40.7 29.1  Hip adduction    Hip internal rotation    Hip external rotation    Knee flexion    Knee extension 44.1 41.6  Ankle dorsiflexion    Ankle plantarflexion    Ankle inversion    Ankle eversion     (Blank rows = not tested)   FUNCTIONAL TESTS:  5 times sit to stand: 24.80 Timed up and go (TUG): 11.25 4 stages:pass  GAIT: Distance walked: 500 ft Assistive device utilized: None Level of assistance: Complete Independence Comments: wfl   TODAY'S TREATMENT:                                                                                                                              Pt seen for aquatic therapy today.  Treatment took place in water 3.5-4.75 ft in depth at the [facility] pool. Temp of water was 91.  Pt entered/exited the pool via stairs independently with bilat rail. * unsupported walking forward/ backwards x 3 laps; side stepping with arm addct/ abdct x 2 laps * side stepping with arm addct/ abdct with rainbow hand floats x 1 lap * TrA set with short hollow noodle pull down to thighs x 5 (cues for technique- challenge) * Straddling noodle, UE on wall in corner:  cycling, suspended hip abdct/ addct and cross country ski   Pt requires the buoyancy and hydrostatic pressure of water for support, and to offload joints by unweighting joint load by at least 50 % in navel deep water and by at least 75-80% in chest to neck deep water.  Viscosity of the water is needed for resistance of strengthening. Water current perturbations provides challenge to standing balance requiring increased core activation.     PATIENT EDUCATION:  Education details:intro to aquatic therapy  Person educated: Patient Education method: Explanation Education comprehension: verbalized understanding  HOME EXERCISE PROGRAM: TBA  ASSESSMENT:  CLINICAL IMPRESSION: Pt is confident in aquatic setting and able to take direction from  therapist on deck.  She tolerated aquatic exercise well, with report of gradual reduction in lower back pain.  Goals are ongoing   From Initial evaluation:  Patient is a 32 y.o. f who was seen today for physical therapy evaluation and treatment for Fibromyalgia . She presents today with her mother. Pt has multiple co-morbidities which  are chronic and pain inducing for which she receiving care for from [facility] specialties. She reports chronic pain back to when she was a child but just recently dx with fibromyalgia.  Main complaint today is overall pain which she is wanting to better manage as well as gain strength to have improved toleration to all activity.  Her pain sensitivity is moderate and constant. She reports limitations with walking and standing as well as sitting in one position too long. Testing demonstrates hypermobility throughout, marked in knees, elbows and spine. She has strength deficits L>R.  OBJECTIVE IMPAIRMENTS: decreased endurance, decreased mobility, difficulty walking, decreased strength, obesity, and hypermobility .   ACTIVITY LIMITATIONS: carrying, lifting, bending, sitting, standing, squatting, and stairs  PARTICIPATION LIMITATIONS: community activity, occupation, and yard work  PERSONAL FACTORS: Age, Fitness, Past/current experiences, Time since onset of injury/illness/exacerbation, and 3+ comorbidities: see PMHx  are also affecting patient's functional outcome.   REHAB POTENTIAL: Good  CLINICAL DECISION MAKING: Evolving/moderate complexity  EVALUATION COMPLEXITY: Moderate   GOALS: Goals reviewed with patient? Yes  SHORT TERM GOALS: Target date: 06/17/23 Pt will tolerate full aquatic sessions consistently without increase in pain and with improving function to demonstrate good toleration and effectiveness of intervention.  Baseline: Goal status: INITIAL  2.  Pt will understand the benefits of aquatic therapy/intervention on long term pain management of  chronic conditions and able to find pool access  Baseline:  Goal status: INITIAL  3.  Pt will tolerate walking to and from setting and engaging in aquatic therapy session without excessive fatigue or increase in pain to demonstrate improved toleration to activity.  Baseline:  Goal status: INITIAL    LONG TERM GOALS: Target date: 07/24/23  Pt will improve by at least 9 points on LEFS to demonstrate improved function Baseline:34/80  Goal status: INITIAL  2.  Pt will be indep with final HEP's (land and aquatic as appropriate) for continued management of condition Baseline:  Goal status: INITIAL  3.  Pt will improve strength of LLE to be with in 5 lb of contralateral side to demonstrate improved overall physical function Baseline:  Goal status: INITIAL  4.  Pt will improve on 5 X STS test to <or= 16s to demonstrate improving functional lower extremity strength, transitional movements, and balance Baseline: 24.80 Goal status: INITIAL  5.  Pt will report overall reduction in pain to by at least 2 NPRS for improved toleration to activity/quality of life and to demonstrate improved management of pain. Baseline: see chart Goal status: INITIAL   PLAN:  PT FREQUENCY: 1-2x/week  PT DURATION: 8 weeks 12 visits  PLANNED INTERVENTIONS: 97164- PT Re-evaluation, 97110-Therapeutic exercises, 97530- Therapeutic activity, 97112- Neuromuscular re-education, 97535- Self Care, 40981- Manual therapy, [phone]- Gait training, [phone]- Orthotic Fit/training, [phone]- Aquatic Therapy, [phone]- Ionotophoresis 4mg /ml Dexamethasone, Patient/Family education, Balance training, Stair training, Taping, Dry Needling, Joint mobilization, DME instructions, Cryotherapy, and Moist heat  PLAN FOR NEXT SESSION: aquatic: pain management; strengthening general; aerobic capacity training,posture and balance; HEP Land: HEP for core and LE strengthening, posture retraining.  HEP; Be mindful of hypermobility 1-2  visits  Mayer Camel, PTA 06/09/23 2:02 PM [facility] [address] [place], [place], [address] Phone: [phone]   Fax:  [phone]    For all possible CPT codes, reference the Planned Interventions line above.     Check all conditions that are expected to impact treatment: {Conditions expected to impact treatment:Musculoskeletal disorders and Neurological condition and/or seizures   If treatment provided  at initial evaluation, no treatment charged due to lack of authorization.

## 2023-06-12 ENCOUNTER — Ambulatory Visit (HOSPITAL_BASED_OUTPATIENT_CLINIC_OR_DEPARTMENT_OTHER): Payer: Medicaid Other | Admitting: Physical Therapy

## 2023-06-12 ENCOUNTER — Encounter (HOSPITAL_BASED_OUTPATIENT_CLINIC_OR_DEPARTMENT_OTHER): Payer: Self-pay | Admitting: Physical Therapy

## 2023-06-12 ENCOUNTER — Other Ambulatory Visit (HOSPITAL_COMMUNITY): Payer: Self-pay

## 2023-06-12 DIAGNOSIS — R2689 Other abnormalities of gait and mobility: Secondary | ICD-10-CM

## 2023-06-12 DIAGNOSIS — M797 Fibromyalgia: Secondary | ICD-10-CM | POA: Diagnosis not present

## 2023-06-12 DIAGNOSIS — M6281 Muscle weakness (generalized): Secondary | ICD-10-CM

## 2023-06-12 NOTE — Therapy (Signed)
OUTPATIENT PHYSICAL THERAPY LOWER EXTREMITY TREATMENT   Patient Name: Carolyn Miranda MRN: [id number] DOB:February 23, 1991, 32 y.o., female Today's Date: 06/12/2023  END OF SESSION:  PT End of Session - 06/12/23 0906     Visit Number 3    Number of Visits 12    Date for PT Re-Evaluation 07/24/23    PT Start Time 0905    PT Stop Time 0945    PT Time Calculation (min) 40 min    Activity Tolerance Patient tolerated treatment well    Behavior During Therapy [facility] for tasks assessed/performed             Past Medical History:  Diagnosis Date   Allergies    Anxiety    Asthma    Depression    Migraines    Seizure (HCC)    Urine incontinence    Past Surgical History:  Procedure Laterality Date   BREAST REDUCTION SURGERY Bilateral 07/08/2017   WISDOM TOOTH EXTRACTION     Patient Active Problem List   Diagnosis Date Noted   Trochanteric bursitis, left hip 04/08/2023   Plantar fasciitis of left foot 04/08/2023   Myofascial pain 10/06/2022   Gastroesophageal reflux disease 06/04/2022   Social anxiety disorder 05/05/2022   GAD (generalized anxiety disorder) 05/05/2022   Antinuclear antibody (ANA) titer greater than 1:80 04/08/2022   Back pain 04/02/2022   Fibromyalgia 04/02/2022   Pelvic pain 04/02/2022   Bilateral hand pain 04/02/2022   Seizures (HCC) 11/02/2019   Encounter for monitoring anticonvulsant therapy 11/01/2019   Allergic rhinitis 03/26/2018   Neck pain 12/27/2015   Encounter for birth control 08/24/2015   Asthma 04/20/2015   Adjustment disorder with mixed anxiety and depressed mood 01/16/2015   Obesity (BMI 30-39.9) 01/16/2015    PCP: Hetty Blend NP-C  REFERRING PROVIDER: Fuller Plan, MD Fanny Dance, MD   REFERRING DIAG: Fibromyalgia   THERAPY DIAG:  Fibromyalgia  Muscle weakness (generalized)  Other abnormalities of gait and mobility  Rationale for Evaluation and Treatment: Rehabilitation  ONSET DATE: 1 year dx  SUBJECTIVE:    SUBJECTIVE STATEMENT: Pt reports she had relief for a few hours after last  visit, but the pain returned in evening.  "My back is really bad today.  I think it may be weather related".    PERTINENT HISTORY: -Diagnosed with fibromyalgia December 2023 LE;LB;L shoulder, pelvic floor -Celebrex which was helping left hip trochanteric bursitis and plantar fasciitis of her feet  PAIN:  Are you having pain? Yes: NPRS scale: 5-6/10  Pain location:  lower back Pain description: sharp, burning, stabbing, and aching constant Aggravating factors: standing >15; sitting extended periods time; walking >15 minutes Relieving factors: ice/heat; CBD gummies.  PRECAUTIONS: None  RED FLAGS: None   WEIGHT BEARING RESTRICTIONS: No  FALLS:  Has patient fallen in last 6 months? Yes 1 ankle rolled  LIVING ENVIRONMENT: Lives with: lives with their family Lives in: House/apartment Stairs: Yes: External: 4 steps; on right going up Has following equipment at home: None  OCCUPATION: cashier  PLOF: Independent  PATIENT GOALS: help hypermobility, learn to exercises, manage my pain  NEXT MD VISIT: 06/17/23  OBJECTIVE:  Note: Objective measures were completed at Evaluation unless otherwise noted.  DIAGNOSTIC FINDINGS: not recent  PATIENT SURVEYS:  LEFS 34/80  COGNITION: Overall cognitive status: Within functional limits for tasks assessed     SENSATION: wfl   MUSCLE LENGTH:    POSTURE: rounded shoulders, posterior pelvic tilt, and flexed trunk    LUMBAR ROM:  Hypermobile  LOWER EXTREMITY ROM:  Active ROM Right eval Left eval  Hip flexion    Hip extension    Hip abduction    Hip adduction    Hip internal rotation    Hip external rotation    Knee flexion    Knee extension hyper hyper  Ankle dorsiflexion    Ankle plantarflexion    Ankle inversion    Ankle eversion     (Blank rows = not tested)  LOWER EXTREMITY MMT:  MMT Right eval Left eval  Hip flexion 52.5 46.5   Hip extension    Hip abduction 40.7 29.1  Hip adduction    Hip internal rotation    Hip external rotation    Knee flexion    Knee extension 44.1 41.6  Ankle dorsiflexion    Ankle plantarflexion    Ankle inversion    Ankle eversion     (Blank rows = not tested)   FUNCTIONAL TESTS:  5 times sit to stand: 24.80 Timed up and go (TUG): 11.25 4 stages:pass  GAIT: Distance walked: 500 ft Assistive device utilized: None Level of assistance: Complete Independence Comments: wfl   TODAY'S TREATMENT:                                                                                                                              Pt seen for aquatic therapy today.  Treatment took place in water 3.5-4.75 ft in depth at the [facility] pool. Temp of water was 91.  Pt entered/exited the pool via stairs independently with bilat rail. * unsupported walking forwardx 3 laps; backwards 2 laps; side stepping with arm addct/ abdct x 1.5 laps * side stepping with arm addct/ abdct with rainbow hand floats x 2.5 lap * farmer carry with single/ bilat rainbow hand floats under water at side, walking forward/ backward * TrA set with short hollow noodle pull down to thighs x 10  * Straddling noodle, UE on wall in corner:  cycling, suspended hip abdct/ addct and cross country ski * back against wall with 3 way LE stretch, with ankle on solid noodle, 15 sec each position, 2 reps each   PATIENT EDUCATION:  Education details:intro to aquatic therapy  Person educated: Patient Education method: Explanation Education comprehension: verbalized understanding  HOME EXERCISE PROGRAM: TBA  ASSESSMENT:  CLINICAL IMPRESSION: Pt able to complete increased volume of exercises without difficulty.  She reported reduction of back pain to 2-3/10 at end of session.  Therapist to check STG next session.    From Initial evaluation:  Patient is a 32 y.o. f who was seen today for physical therapy evaluation and  treatment for Fibromyalgia . She presents today with her mother. Pt has multiple co-morbidities which are chronic and pain inducing for which she receiving care for from [facility] specialties. She reports chronic pain back to when she was a child but just recently dx with fibromyalgia.  Main complaint today is  overall pain which she is wanting to better manage as well as gain strength to have improved toleration to all activity.  Her pain sensitivity is moderate and constant. She reports limitations with walking and standing as well as sitting in one position too long. Testing demonstrates hypermobility throughout, marked in knees, elbows and spine. She has strength deficits L>R.  OBJECTIVE IMPAIRMENTS: decreased endurance, decreased mobility, difficulty walking, decreased strength, obesity, and hypermobility .   ACTIVITY LIMITATIONS: carrying, lifting, bending, sitting, standing, squatting, and stairs  PARTICIPATION LIMITATIONS: community activity, occupation, and yard work  PERSONAL FACTORS: Age, Fitness, Past/current experiences, Time since onset of injury/illness/exacerbation, and 3+ comorbidities: see PMHx  are also affecting patient's functional outcome.   REHAB POTENTIAL: Good  CLINICAL DECISION MAKING: Evolving/moderate complexity  EVALUATION COMPLEXITY: Moderate   GOALS: Goals reviewed with patient? Yes  SHORT TERM GOALS: Target date: 06/17/23 Pt will tolerate full aquatic sessions consistently without increase in pain and with improving function to demonstrate good toleration and effectiveness of intervention.  Baseline: Goal status: INITIAL  2.  Pt will understand the benefits of aquatic therapy/intervention on long term pain management of chronic conditions and able to find pool access  Baseline:  Goal status: INITIAL  3.  Pt will tolerate walking to and from setting and engaging in aquatic therapy session without excessive fatigue or increase in pain to demonstrate improved  toleration to activity.  Baseline:  Goal status: INITIAL    LONG TERM GOALS: Target date: 07/24/23  Pt will improve by at least 9 points on LEFS to demonstrate improved function Baseline:34/80  Goal status: INITIAL  2.  Pt will be indep with final HEP's (land and aquatic as appropriate) for continued management of condition Baseline:  Goal status: INITIAL  3.  Pt will improve strength of LLE to be with in 5 lb of contralateral side to demonstrate improved overall physical function Baseline:  Goal status: INITIAL  4.  Pt will improve on 5 X STS test to <or= 16s to demonstrate improving functional lower extremity strength, transitional movements, and balance Baseline: 24.80 Goal status: INITIAL  5.  Pt will report overall reduction in pain to by at least 2 NPRS for improved toleration to activity/quality of life and to demonstrate improved management of pain. Baseline: see chart Goal status: INITIAL   PLAN:  PT FREQUENCY: 1-2x/week  PT DURATION: 8 weeks 12 visits  PLANNED INTERVENTIONS: 97164- PT Re-evaluation, 97110-Therapeutic exercises, 97530- Therapeutic activity, 97112- Neuromuscular re-education, 97535- Self Care, 24401- Manual therapy, [phone]- Gait training, [phone]- Orthotic Fit/training, [phone]- Aquatic Therapy, [phone]- Ionotophoresis 4mg /ml Dexamethasone, Patient/Family education, Balance training, Stair training, Taping, Dry Needling, Joint mobilization, DME instructions, Cryotherapy, and Moist heat  PLAN FOR NEXT SESSION: aquatic: pain management; strengthening general; aerobic capacity training,posture and balance; HEP Land: HEP for core and LE strengthening, posture retraining.  HEP; Be mindful of hypermobility 1-2 visits   For all possible CPT codes, reference the Planned Interventions line above.     Check all conditions that are expected to impact treatment: {Conditions expected to impact treatment:Musculoskeletal disorders and Neurological condition and/or  seizures   If treatment provided at initial evaluation, no treatment charged due to lack of authorization.

## 2023-06-15 ENCOUNTER — Other Ambulatory Visit (HOSPITAL_COMMUNITY): Payer: Self-pay

## 2023-06-16 ENCOUNTER — Ambulatory Visit (HOSPITAL_BASED_OUTPATIENT_CLINIC_OR_DEPARTMENT_OTHER): Payer: Medicaid Other | Admitting: Physical Therapy

## 2023-06-16 ENCOUNTER — Encounter (HOSPITAL_BASED_OUTPATIENT_CLINIC_OR_DEPARTMENT_OTHER): Payer: Self-pay | Admitting: Physical Therapy

## 2023-06-16 DIAGNOSIS — M6281 Muscle weakness (generalized): Secondary | ICD-10-CM

## 2023-06-16 DIAGNOSIS — R2689 Other abnormalities of gait and mobility: Secondary | ICD-10-CM

## 2023-06-16 DIAGNOSIS — M797 Fibromyalgia: Secondary | ICD-10-CM

## 2023-06-16 NOTE — Therapy (Signed)
OUTPATIENT PHYSICAL THERAPY LOWER EXTREMITY TREATMENT   Patient Name: Carolyn Miranda MRN: [id number] DOB:1990-11-07, 32 y.o., female Today's Date: 06/16/2023  END OF SESSION:  PT End of Session - 06/16/23 0954     Visit Number 4    Number of Visits 12    Date for PT Re-Evaluation 07/24/23    Authorization Time Period 10 visits from 10/30-12/29    Authorization - Visit Number 3    Authorization - Number of Visits 10    PT Start Time 0950    PT Stop Time 1028    PT Time Calculation (min) 38 min    Activity Tolerance Patient tolerated treatment well    Behavior During Therapy [facility] for tasks assessed/performed             Past Medical History:  Diagnosis Date   Allergies    Anxiety    Asthma    Depression    Migraines    Seizure (HCC)    Urine incontinence    Past Surgical History:  Procedure Laterality Date   BREAST REDUCTION SURGERY Bilateral 07/08/2017   WISDOM TOOTH EXTRACTION     Patient Active Problem List   Diagnosis Date Noted   Trochanteric bursitis, left hip 04/08/2023   Plantar fasciitis of left foot 04/08/2023   Myofascial pain 10/06/2022   Gastroesophageal reflux disease 06/04/2022   Social anxiety disorder 05/05/2022   GAD (generalized anxiety disorder) 05/05/2022   Antinuclear antibody (ANA) titer greater than 1:80 04/08/2022   Back pain 04/02/2022   Fibromyalgia 04/02/2022   Pelvic pain 04/02/2022   Bilateral hand pain 04/02/2022   Seizures (HCC) 11/02/2019   Encounter for monitoring anticonvulsant therapy 11/01/2019   Allergic rhinitis 03/26/2018   Neck pain 12/27/2015   Encounter for birth control 08/24/2015   Asthma 04/20/2015   Adjustment disorder with mixed anxiety and depressed mood 01/16/2015   Obesity (BMI 30-39.9) 01/16/2015    PCP: Hetty Blend NP-C  REFERRING PROVIDER: Fuller Plan, MD Fanny Dance, MD   REFERRING DIAG: Fibromyalgia   THERAPY DIAG:  Fibromyalgia  Muscle weakness (generalized)  Other  abnormalities of gait and mobility  Rationale for Evaluation and Treatment: Rehabilitation  ONSET DATE: 1 year dx  SUBJECTIVE:   SUBJECTIVE STATEMENT: Pt reports she has some soreness in her hips and lower back.  She noticed that she could walk better down the hall on her L foot today.     POOL ACCESS:  still considering her options; will talk with dad about [employer] membership  PERTINENT HISTORY: -Diagnosed with fibromyalgia December 2023 LE;LB;L shoulder, pelvic floor -Celebrex which was helping left hip trochanteric bursitis and plantar fasciitis of her feet  PAIN:  Are you having pain? Yes: NPRS scale: 4/10  Pain location:  lower back Pain description: sharp, burning, stabbing, and aching constant Aggravating factors: standing >15; sitting extended periods time; walking >15 minutes Relieving factors: ice/heat; CBD gummies.  PRECAUTIONS: None  RED FLAGS: None   WEIGHT BEARING RESTRICTIONS: No  FALLS:  Has patient fallen in last 6 months? Yes 1 ankle rolled  LIVING ENVIRONMENT: Lives with: lives with their family Lives in: House/apartment Stairs: Yes: External: 4 steps; on right going up Has following equipment at home: None  OCCUPATION: cashier  PLOF: Independent  PATIENT GOALS: help hypermobility, learn to exercises, manage my pain  NEXT MD VISIT: 06/17/23  OBJECTIVE:  Note: Objective measures were completed at Evaluation unless otherwise noted.  DIAGNOSTIC FINDINGS: not recent  PATIENT SURVEYS:  LEFS 34/80  COGNITION: Overall cognitive status: Within functional limits for tasks assessed     SENSATION: wfl   MUSCLE LENGTH:    POSTURE: rounded shoulders, posterior pelvic tilt, and flexed trunk    LUMBAR ROM:   Hypermobile  LOWER EXTREMITY ROM:  Active ROM Right eval Left eval  Hip flexion    Hip extension    Hip abduction    Hip adduction    Hip internal rotation    Hip external rotation    Knee flexion    Knee extension hyper hyper   Ankle dorsiflexion    Ankle plantarflexion    Ankle inversion    Ankle eversion     (Blank rows = not tested)  LOWER EXTREMITY MMT:  MMT Right eval Left eval  Hip flexion 52.5 46.5  Hip extension    Hip abduction 40.7 29.1  Hip adduction    Hip internal rotation    Hip external rotation    Knee flexion    Knee extension 44.1 41.6  Ankle dorsiflexion    Ankle plantarflexion    Ankle inversion    Ankle eversion     (Blank rows = not tested)   FUNCTIONAL TESTS:  5 times sit to stand: 24.80 Timed up and go (TUG): 11.25 4 stages:pass  GAIT: Distance walked: 500 ft Assistive device utilized: None Level of assistance: Complete Independence Comments: wfl   TODAY'S TREATMENT:                                                                                                                              Pt seen for aquatic therapy today.  Treatment took place in water 3.5-4.75 ft in depth at the [facility] pool. Temp of water was 91.  Pt entered/exited the pool via stairs independently with bilat rail.  * unsupported walking forward/ backward x 3 laps * side stepping with arm addct/ abdct with rainbow hand floats x 3 lap * UE on wall:  hip abdct/ addct x 10 each;  leg swings into hip flex/ext x 10;  single leg clams alternating LEs * farmer carry with bilat rainbow hand floats under water at side, walking forward/ backward * TrA set with short hollow noodle pull down to thighs x 10  * staggered stance with bilat shoulder horiz abdct/ addct x 5 each leg forward * marching forward / backward with reciprocal row with rainbow hand floats, cues for technique and upright posture * Straddling noodle, UE on wall in corner:  cycling, suspended hip abdct/ addct and cross country ski    PATIENT EDUCATION:  Education details: aquatic therapy progressions/ modifications Person educated: Patient Education method: Explanation Education comprehension: verbalized  understanding  HOME EXERCISE PROGRAM: TBA  ASSESSMENT:  CLINICAL IMPRESSION: Pt tolerated aquatic exercises well, with report of gradual reduction of pain in lower back.  She reported reduction of overall pain since intiating therapy. .  Pt has met STG 1 and is making good progress towards  remaining goals.  Will plan to begin creating aquatic HEP for pt to begin using when she gains access to pool.      From Initial evaluation:  Patient is a 32 y.o. f who was seen today for physical therapy evaluation and treatment for Fibromyalgia . She presents today with her mother. Pt has multiple co-morbidities which are chronic and pain inducing for which she receiving care for from [facility] specialties. She reports chronic pain back to when she was a child but just recently dx with fibromyalgia.  Main complaint today is overall pain which she is wanting to better manage as well as gain strength to have improved toleration to all activity.  Her pain sensitivity is moderate and constant. She reports limitations with walking and standing as well as sitting in one position too long. Testing demonstrates hypermobility throughout, marked in knees, elbows and spine. She has strength deficits L>R.  OBJECTIVE IMPAIRMENTS: decreased endurance, decreased mobility, difficulty walking, decreased strength, obesity, and hypermobility .   ACTIVITY LIMITATIONS: carrying, lifting, bending, sitting, standing, squatting, and stairs  PARTICIPATION LIMITATIONS: community activity, occupation, and yard work  PERSONAL FACTORS: Age, Fitness, Past/current experiences, Time since onset of injury/illness/exacerbation, and 3+ comorbidities: see PMHx  are also affecting patient's functional outcome.   REHAB POTENTIAL: Good  CLINICAL DECISION MAKING: Evolving/moderate complexity  EVALUATION COMPLEXITY: Moderate   GOALS: Goals reviewed with patient? Yes  SHORT TERM GOALS: Target date: 06/17/23 Pt will tolerate full aquatic  sessions consistently without increase in pain and with improving function to demonstrate good toleration and effectiveness of intervention.  Baseline: Goal status: MET - 06/16/23  2.  Pt will understand the benefits of aquatic therapy/intervention on long term pain management of chronic conditions and able to find pool access  Baseline:  Goal status:IN PROGRESS -06/16/23  3.  Pt will tolerate walking to and from setting and engaging in aquatic therapy session without excessive fatigue or increase in pain to demonstrate improved toleration to activity.  Baseline:  Goal status: IN PROGRESS - 06/16/23    LONG TERM GOALS: Target date: 07/24/23  Pt will improve by at least 9 points on LEFS to demonstrate improved function Baseline:34/80  Goal status: INITIAL  2.  Pt will be indep with final HEP's (land and aquatic as appropriate) for continued management of condition Baseline:  Goal status: INITIAL  3.  Pt will improve strength of LLE to be with in 5 lb of contralateral side to demonstrate improved overall physical function Baseline:  Goal status: INITIAL  4.  Pt will improve on 5 X STS test to <or= 16s to demonstrate improving functional lower extremity strength, transitional movements, and balance Baseline: 24.80 Goal status: INITIAL  5.  Pt will report overall reduction in pain to by at least 2 NPRS for improved toleration to activity/quality of life and to demonstrate improved management of pain. Baseline: see chart Goal status: INITIAL   PLAN:  PT FREQUENCY: 1-2x/week  PT DURATION: 8 weeks 12 visits  PLANNED INTERVENTIONS: 97164- PT Re-evaluation, 97110-Therapeutic exercises, 97530- Therapeutic activity, 97112- Neuromuscular re-education, 97535- Self Care, 82956- Manual therapy, [phone]- Gait training, [phone]- Orthotic Fit/training, [phone]- Aquatic Therapy, [phone]- Ionotophoresis 4mg /ml Dexamethasone, Patient/Family education, Balance training, Stair training, Taping, Dry  Needling, Joint mobilization, DME instructions, Cryotherapy, and Moist heat  PLAN FOR NEXT SESSION: aquatic: pain management; strengthening general; aerobic capacity training,posture and balance; HEP Land: HEP for core and LE strengthening, posture retraining.  HEP; Be mindful of hypermobility 1-2 visits   For all  possible CPT codes, reference the Planned Interventions line above.     Check all conditions that are expected to impact treatment: {Conditions expected to impact treatment:Musculoskeletal disorders and Neurological condition and/or seizures   If treatment provided at initial evaluation, no treatment charged due to lack of authorization.      Mayer Camel, PTA 06/16/23 12:24 PM [facility] [address] [place], [place], [address] Phone: [phone]   Fax:  [phone]

## 2023-06-17 ENCOUNTER — Ambulatory Visit: Payer: Medicaid Other | Admitting: Family Medicine

## 2023-06-18 ENCOUNTER — Encounter (HOSPITAL_BASED_OUTPATIENT_CLINIC_OR_DEPARTMENT_OTHER): Payer: Self-pay | Admitting: Physical Therapy

## 2023-06-18 ENCOUNTER — Ambulatory Visit (HOSPITAL_BASED_OUTPATIENT_CLINIC_OR_DEPARTMENT_OTHER): Payer: Medicaid Other | Admitting: Physical Therapy

## 2023-06-18 DIAGNOSIS — M797 Fibromyalgia: Secondary | ICD-10-CM | POA: Diagnosis not present

## 2023-06-18 DIAGNOSIS — M6281 Muscle weakness (generalized): Secondary | ICD-10-CM

## 2023-06-18 DIAGNOSIS — R2689 Other abnormalities of gait and mobility: Secondary | ICD-10-CM

## 2023-06-18 NOTE — Therapy (Signed)
OUTPATIENT PHYSICAL THERAPY LOWER EXTREMITY TREATMENT   Patient Name: Carolyn Miranda MRN: [id number] DOB:[birth year], 32 y.o., female Today's Date: 06/18/2023  END OF SESSION:  PT End of Session - 06/18/23 1711     Visit Number 5    Number of Visits 12    Date for PT Re-Evaluation 07/24/23    Authorization Time Period 10 visits from 10/30-12/29    Authorization - Visit Number 4    Authorization - Number of Visits 10    PT Start Time 1703    PT Stop Time 1741    PT Time Calculation (min) 38 min    Behavior During Therapy [facility] for tasks assessed/performed             Past Medical History:  Diagnosis Date   Allergies    Anxiety    Asthma    Depression    Migraines    Seizure (HCC)    Urine incontinence    Past Surgical History:  Procedure Laterality Date   BREAST REDUCTION SURGERY Bilateral 07/08/2017   WISDOM TOOTH EXTRACTION     Patient Active Problem List   Diagnosis Date Noted   Trochanteric bursitis, left hip 04/08/2023   Plantar fasciitis of left foot 04/08/2023   Myofascial pain 10/06/2022   Gastroesophageal reflux disease 06/04/2022   Social anxiety disorder 05/05/2022   GAD (generalized anxiety disorder) 05/05/2022   Antinuclear antibody (ANA) titer greater than 1:80 04/08/2022   Back pain 04/02/2022   Fibromyalgia 04/02/2022   Pelvic pain 04/02/2022   Bilateral hand pain 04/02/2022   Seizures (HCC) 11/02/2019   Encounter for monitoring anticonvulsant therapy 11/01/2019   Allergic rhinitis 03/26/2018   Neck pain 12/27/2015   Encounter for birth control 08/24/2015   Asthma 04/20/2015   Adjustment disorder with mixed anxiety and depressed mood 01/16/2015   Obesity (BMI 30-39.9) 01/16/2015    PCP: Hetty Blend NP-C  REFERRING PROVIDER: Fuller Plan, MD Fanny Dance, MD   REFERRING DIAG: Fibromyalgia   THERAPY DIAG:  Fibromyalgia  Muscle weakness (generalized)  Other abnormalities of gait and mobility  Rationale for  Evaluation and Treatment: Rehabilitation  ONSET DATE: 1 year dx  SUBJECTIVE:   SUBJECTIVE STATEMENT: Pt reports she woke up with increased Low back pain.  She has noticed improved ability to sit up straight with less pain.   POOL ACCESS:  still considering her options; will talk with dad about [employer] membership  PERTINENT HISTORY: -Diagnosed with fibromyalgia December 2023 LE;LB;L shoulder, pelvic floor -Celebrex which was helping left hip trochanteric bursitis and plantar fasciitis of her feet  PAIN:  Are you having pain? Yes: NPRS scale: 7/10  Pain location:  lower back Pain description: sharp, burning, stabbing, and aching constant Aggravating factors: standing >15; sitting extended periods time; walking >15 minutes Relieving factors: ice/heat; CBD gummies.  PRECAUTIONS: None  RED FLAGS: None   WEIGHT BEARING RESTRICTIONS: No  FALLS:  Has patient fallen in last 6 months? Yes 1 ankle rolled  LIVING ENVIRONMENT: Lives with: lives with their family Lives in: House/apartment Stairs: Yes: External: 4 steps; on right going up Has following equipment at home: None  OCCUPATION: cashier  PLOF: Independent  PATIENT GOALS: help hypermobility, learn to exercises, manage my pain  NEXT MD VISIT: 06/17/23  OBJECTIVE:  Note: Objective measures were completed at Evaluation unless otherwise noted.  DIAGNOSTIC FINDINGS: not recent  PATIENT SURVEYS:  LEFS 34/80  COGNITION: Overall cognitive status: Within functional limits for tasks assessed     SENSATION: wfl  MUSCLE LENGTH:    POSTURE: rounded shoulders, posterior pelvic tilt, and flexed trunk    LUMBAR ROM:   Hypermobile  LOWER EXTREMITY ROM:  Active ROM Right eval Left eval  Hip flexion    Hip extension    Hip abduction    Hip adduction    Hip internal rotation    Hip external rotation    Knee flexion    Knee extension hyper hyper  Ankle dorsiflexion    Ankle plantarflexion    Ankle inversion     Ankle eversion     (Blank rows = not tested)  LOWER EXTREMITY MMT:  MMT Right eval Left eval  Hip flexion 52.5 46.5  Hip extension    Hip abduction 40.7 29.1  Hip adduction    Hip internal rotation    Hip external rotation    Knee flexion    Knee extension 44.1 41.6  Ankle dorsiflexion    Ankle plantarflexion    Ankle inversion    Ankle eversion     (Blank rows = not tested)   FUNCTIONAL TESTS:  5 times sit to stand: 24.80 Timed up and go (TUG): 11.25 4 stages:pass  GAIT: Distance walked: 500 ft Assistive device utilized: None Level of assistance: Complete Independence Comments: wfl   TODAY'S TREATMENT:                                                                                                                              Pt seen for aquatic therapy today.  Treatment took place in water 3.5-4.75 ft in depth at the [facility] pool. Temp of water was 91.  Pt entered/exited the pool via stairs independently with bilat rail.  * Straddling noodle, UE on wall in corner:  cycling, suspended hip abdct/ addct and cross country ski * unsupported walking forward/ backward x 2 laps * marching forward / backward with reciprocal row with rainbow hand floats, cues for technique and upright posture * side stepping with arm addct/ abdct with rainbow hand floats x 3 lap * farmer carry with bilat rainbow hand floats under water at side, walking forward/ backward * hands on bench in water-> plank with alternating hip ext x 10; slow mountain climbers x 5;  fire hydrants x 5 each  * TrA set with solid noodle pull down to thighs x 10  * seated balance on solid noodle - attempts at single hand out of water * 3 way LE stretch with ankle on solid noodle   PATIENT EDUCATION:  Education details: aquatic therapy progressions/ modifications Person educated: Patient Education method: Explanation Education comprehension: verbalized understanding  HOME EXERCISE  PROGRAM: TBA  ASSESSMENT:  CLINICAL IMPRESSION: Pt tolerated aquatic exercises well, with report of gradual reduction of pain in lower back.  She reported reduction of overall pain since intiating therapy. .  Pt has met STG 3 and is making good progress towards remaining goals.  Will plan to begin creating aquatic HEP  for pt to begin using when she gains access to pool.      From Initial evaluation:  Patient is a 32 y.o. f who was seen today for physical therapy evaluation and treatment for Fibromyalgia . She presents today with her mother. Pt has multiple co-morbidities which are chronic and pain inducing for which she receiving care for from [facility] specialties. She reports chronic pain back to when she was a child but just recently dx with fibromyalgia.  Main complaint today is overall pain which she is wanting to better manage as well as gain strength to have improved toleration to all activity.  Her pain sensitivity is moderate and constant. She reports limitations with walking and standing as well as sitting in one position too long. Testing demonstrates hypermobility throughout, marked in knees, elbows and spine. She has strength deficits L>R.  OBJECTIVE IMPAIRMENTS: decreased endurance, decreased mobility, difficulty walking, decreased strength, obesity, and hypermobility .   ACTIVITY LIMITATIONS: carrying, lifting, bending, sitting, standing, squatting, and stairs  PARTICIPATION LIMITATIONS: community activity, occupation, and yard work  PERSONAL FACTORS: Age, Fitness, Past/current experiences, Time since onset of injury/illness/exacerbation, and 3+ comorbidities: see PMHx  are also affecting patient's functional outcome.   REHAB POTENTIAL: Good  CLINICAL DECISION MAKING: Evolving/moderate complexity  EVALUATION COMPLEXITY: Moderate   GOALS: Goals reviewed with patient? Yes  SHORT TERM GOALS: Target date: 06/17/23 Pt will tolerate full aquatic sessions consistently without  increase in pain and with improving function to demonstrate good toleration and effectiveness of intervention.  Baseline: Goal status: MET - 06/16/23  2.  Pt will understand the benefits of aquatic therapy/intervention on long term pain management of chronic conditions and able to find pool access  Baseline:  Goal status:IN PROGRESS -06/16/23  3.  Pt will tolerate walking to and from setting and engaging in aquatic therapy session without excessive fatigue or increase in pain to demonstrate improved toleration to activity.  Baseline:  Goal status:MET - 06/18/23    LONG TERM GOALS: Target date: 07/24/23  Pt will improve by at least 9 points on LEFS to demonstrate improved function Baseline:34/80  Goal status: INITIAL  2.  Pt will be indep with final HEP's (land and aquatic as appropriate) for continued management of condition Baseline:  Goal status: INITIAL  3.  Pt will improve strength of LLE to be with in 5 lb of contralateral side to demonstrate improved overall physical function Baseline:  Goal status: INITIAL  4.  Pt will improve on 5 X STS test to <or= 16s to demonstrate improving functional lower extremity strength, transitional movements, and balance Baseline: 24.80 Goal status: INITIAL  5.  Pt will report overall reduction in pain to by at least 2 NPRS for improved toleration to activity/quality of life and to demonstrate improved management of pain. Baseline: see chart Goal status: INITIAL   PLAN:  PT FREQUENCY: 1-2x/week  PT DURATION: 8 weeks 12 visits  PLANNED INTERVENTIONS: 97164- PT Re-evaluation, 97110-Therapeutic exercises, 97530- Therapeutic activity, 97112- Neuromuscular re-education, 97535- Self Care, 16109- Manual therapy, [phone]- Gait training, [phone]- Orthotic Fit/training, [phone]- Aquatic Therapy, [phone]- Ionotophoresis 4mg /ml Dexamethasone, Patient/Family education, Balance training, Stair training, Taping, Dry Needling, Joint mobilization, DME  instructions, Cryotherapy, and Moist heat  PLAN FOR NEXT SESSION: aquatic: pain management; strengthening general; aerobic capacity training,posture and balance; HEP Land: HEP for core and LE strengthening, posture retraining.  HEP; Be mindful of hypermobility 1-2 visits  Mayer Camel, PTA 06/18/23 5:53 PM [facility] [phone]  [address]  [place], [place], [address] Phone: [phone]   Fax:  [phone]    For all possible CPT codes, reference the Planned Interventions line above.     Check all conditions that are expected to impact treatment: {Conditions expected to impact treatment:Musculoskeletal disorders and Neurological condition and/or seizures   If treatment provided at initial evaluation, no treatment charged due to lack of authorization.

## 2023-06-23 ENCOUNTER — Ambulatory Visit (HOSPITAL_BASED_OUTPATIENT_CLINIC_OR_DEPARTMENT_OTHER): Payer: Medicaid Other | Admitting: Physical Therapy

## 2023-06-23 ENCOUNTER — Encounter (HOSPITAL_BASED_OUTPATIENT_CLINIC_OR_DEPARTMENT_OTHER): Payer: Self-pay | Admitting: Physical Therapy

## 2023-06-23 DIAGNOSIS — M6281 Muscle weakness (generalized): Secondary | ICD-10-CM

## 2023-06-23 DIAGNOSIS — R2689 Other abnormalities of gait and mobility: Secondary | ICD-10-CM

## 2023-06-23 DIAGNOSIS — M797 Fibromyalgia: Secondary | ICD-10-CM | POA: Diagnosis not present

## 2023-06-23 NOTE — Therapy (Signed)
OUTPATIENT PHYSICAL THERAPY LOWER EXTREMITY TREATMENT   Patient Name: Carolyn Miranda MRN: [id number] DOB:1990-08-06, 32 y.o., female Today's Date: 06/23/2023  END OF SESSION:  PT End of Session - 06/23/23 1147     Visit Number 6    Number of Visits 12    Date for PT Re-Evaluation 07/24/23    Authorization Time Period 10 visits from 10/30-12/29    Activity Tolerance Patient tolerated treatment well    Behavior During Therapy [facility] for tasks assessed/performed             Past Medical History:  Diagnosis Date   Allergies    Anxiety    Asthma    Depression    Migraines    Seizure (HCC)    Urine incontinence    Past Surgical History:  Procedure Laterality Date   BREAST REDUCTION SURGERY Bilateral 07/08/2017   WISDOM TOOTH EXTRACTION     Patient Active Problem List   Diagnosis Date Noted   Trochanteric bursitis, left hip 04/08/2023   Plantar fasciitis of left foot 04/08/2023   Myofascial pain 10/06/2022   Gastroesophageal reflux disease 06/04/2022   Social anxiety disorder 05/05/2022   GAD (generalized anxiety disorder) 05/05/2022   Antinuclear antibody (ANA) titer greater than 1:80 04/08/2022   Back pain 04/02/2022   Fibromyalgia 04/02/2022   Pelvic pain 04/02/2022   Bilateral hand pain 04/02/2022   Seizures (HCC) 11/02/2019   Encounter for monitoring anticonvulsant therapy 11/01/2019   Allergic rhinitis 03/26/2018   Neck pain 12/27/2015   Encounter for birth control 08/24/2015   Asthma 04/20/2015   Adjustment disorder with mixed anxiety and depressed mood 01/16/2015   Obesity (BMI 30-39.9) 01/16/2015    PCP: Hetty Blend NP-C  REFERRING PROVIDER: Fuller Plan, MD Fanny Dance, MD   REFERRING DIAG: Fibromyalgia   THERAPY DIAG:  No diagnosis found.  Rationale for Evaluation and Treatment: Rehabilitation  ONSET DATE: 1 year dx  SUBJECTIVE:   SUBJECTIVE STATEMENT: Patient reports she feels like the pool therapy is really helped.   She feels like she was doing better with her overall posture.  She continues to have pain.  She feels that her low back still continues to be disorganized.  Gluteals.  She is also having significant pain on the bottom of her foot.   POOL ACCESS:  still considering her options; will talk with dad about [employer] membership  PERTINENT HISTORY: -Diagnosed with fibromyalgia December 2023 LE;LB;L shoulder, pelvic floor -Celebrex which was helping left hip trochanteric bursitis and plantar fasciitis of her feet  PAIN:  Are you having pain? Yes: NPRS scale: 7/10  Pain location:  lower back Pain description: sharp, burning, stabbing, and aching constant Aggravating factors: standing >15; sitting extended periods time; walking >15 minutes Relieving factors: ice/heat; CBD gummies.  PRECAUTIONS: None  RED FLAGS: None   WEIGHT BEARING RESTRICTIONS: No  FALLS:  Has patient fallen in last 6 months? Yes 1 ankle rolled  LIVING ENVIRONMENT: Lives with: lives with their family Lives in: House/apartment Stairs: Yes: External: 4 steps; on right going up Has following equipment at home: None  OCCUPATION: cashier  PLOF: Independent  PATIENT GOALS: help hypermobility, learn to exercises, manage my pain  NEXT MD VISIT: 06/17/23  OBJECTIVE:  Note: Objective measures were completed at Evaluation unless otherwise noted.  DIAGNOSTIC FINDINGS: not recent  PATIENT SURVEYS:  LEFS 34/80  COGNITION: Overall cognitive status: Within functional limits for tasks assessed     SENSATION: wfl   MUSCLE LENGTH:  POSTURE: rounded shoulders, posterior pelvic tilt, and flexed trunk    LUMBAR ROM:   Hypermobile  LOWER EXTREMITY ROM:  Active ROM Right eval Left eval  Hip flexion    Hip extension    Hip abduction    Hip adduction    Hip internal rotation    Hip external rotation    Knee flexion    Knee extension hyper hyper  Ankle dorsiflexion    Ankle plantarflexion    Ankle inversion     Ankle eversion     (Blank rows = not tested)  LOWER EXTREMITY MMT:  MMT Right eval Left eval  Hip flexion 52.5 46.5  Hip extension    Hip abduction 40.7 29.1  Hip adduction    Hip internal rotation    Hip external rotation    Knee flexion    Knee extension 44.1 41.6  Ankle dorsiflexion    Ankle plantarflexion    Ankle inversion    Ankle eversion     (Blank rows = not tested)   FUNCTIONAL TESTS:  5 times sit to stand: 24.80 Timed up and go (TUG): 11.25 4 stages:pass  GAIT: Distance walked: 500 ft Assistive device utilized: None Level of assistance: Complete Independence Comments: wfl   TODAY'S TREATMENT:                                                                                                                              11/26 .  Worked on developing patient initial lab program.  NuStep 5 minutes level 3  Ball roll out for back stretch Forward 5 x 10-second hold 5 x 10-second hold lateral Ball press with abdominal breathing 2 x 10  Viewed self trigger point release.  Shown how to use Thera cane on low back, upper traps, and her foot.  Also shown how to use tennis ball for self trigger point release.  Reviewed PNE concepts  Shoulder extension 3 x 10 Scap retraction 3 x 10 both with yellow   Last visit:  Pt seen for aquatic therapy today.  Treatment took place in water 3.5-4.75 ft in depth at the [facility] pool. Temp of water was 91.  Pt entered/exited the pool via stairs independently with bilat rail.  * Straddling noodle, UE on wall in corner:  cycling, suspended hip abdct/ addct and cross country ski * unsupported walking forward/ backward x 2 laps * marching forward / backward with reciprocal row with rainbow hand floats, cues for technique and upright posture * side stepping with arm addct/ abdct with rainbow hand floats x 3 lap * farmer carry with bilat rainbow hand floats under water at side, walking forward/ backward * hands on  bench in water-> plank with alternating hip ext x 10; slow mountain climbers x 5;  fire hydrants x 5 each  * TrA set with solid noodle pull down to thighs x 10  * seated balance on solid noodle - attempts at single  hand out of water * 3 way LE stretch with ankle on solid noodle   PATIENT EDUCATION:  Education details: aquatic therapy progressions/ modifications Person educated: Patient Education method: Explanation Education comprehension: verbalized understanding  HOME EXERCISE PROGRAM: TBA  ASSESSMENT:  CLINICAL IMPRESSION: Patient tolerated treatment very well.  She is very motivated to improve strength and work on her exercise program.  We reviewed at activities that we will reduce her pain this visit.  We also added in a slight postural strengthening exercise program.  Will continue to expand exercises.  She is advised to pick 2 stretches in order trigger point release activities that improve her pain the most.  Will expand her exercises next visit.  We reviewed PNE concepts such as her versus harm and decreases sensitivity of central nervous system through light activity.  Will continue to develop her program wherever she wants to exercise.  She is advised that she plans on joining the gym we will work her in the gym exercises.   From Initial evaluation:  Patient is a 32 y.o. f who was seen today for physical therapy evaluation and treatment for Fibromyalgia . She presents today with her mother. Pt has multiple co-morbidities which are chronic and pain inducing for which she receiving care for from [facility] specialties. She reports chronic pain back to when she was a child but just recently dx with fibromyalgia.  Main complaint today is overall pain which she is wanting to better manage as well as gain strength to have improved toleration to all activity.  Her pain sensitivity is moderate and constant. She reports limitations with walking and standing as well as sitting in one position  too long. Testing demonstrates hypermobility throughout, marked in knees, elbows and spine. She has strength deficits L>R.  OBJECTIVE IMPAIRMENTS: decreased endurance, decreased mobility, difficulty walking, decreased strength, obesity, and hypermobility .   ACTIVITY LIMITATIONS: carrying, lifting, bending, sitting, standing, squatting, and stairs  PARTICIPATION LIMITATIONS: community activity, occupation, and yard work  PERSONAL FACTORS: Age, Fitness, Past/current experiences, Time since onset of injury/illness/exacerbation, and 3+ comorbidities: see PMHx  are also affecting patient's functional outcome.   REHAB POTENTIAL: Good  CLINICAL DECISION MAKING: Evolving/moderate complexity  EVALUATION COMPLEXITY: Moderate   GOALS: Goals reviewed with patient? Yes  SHORT TERM GOALS: Target date: 06/17/23 Pt will tolerate full aquatic sessions consistently without increase in pain and with improving function to demonstrate good toleration and effectiveness of intervention.  Baseline: Goal status: MET - 06/16/23  2.  Pt will understand the benefits of aquatic therapy/intervention on long term pain management of chronic conditions and able to find pool access  Baseline:  Goal status:IN PROGRESS -06/16/23  3.  Pt will tolerate walking to and from setting and engaging in aquatic therapy session without excessive fatigue or increase in pain to demonstrate improved toleration to activity.  Baseline:  Goal status:MET - 06/18/23    LONG TERM GOALS: Target date: 07/24/23  Pt will improve by at least 9 points on LEFS to demonstrate improved function Baseline:34/80  Goal status: INITIAL  2.  Pt will be indep with final HEP's (land and aquatic as appropriate) for continued management of condition Baseline:  Goal status: INITIAL  3.  Pt will improve strength of LLE to be with in 5 lb of contralateral side to demonstrate improved overall physical function Baseline:  Goal status:  INITIAL  4.  Pt will improve on 5 X STS test to <or= 16s to demonstrate improving functional lower extremity strength,  transitional movements, and balance Baseline: 24.80 Goal status: INITIAL  5.  Pt will report overall reduction in pain to by at least 2 NPRS for improved toleration to activity/quality of life and to demonstrate improved management of pain. Baseline: see chart Goal status: INITIAL   PLAN:  PT FREQUENCY: 1-2x/week  PT DURATION: 8 weeks 12 visits  PLANNED INTERVENTIONS: 97164- PT Re-evaluation, 97110-Therapeutic exercises, 97530- Therapeutic activity, 97112- Neuromuscular re-education, 97535- Self Care, 62130- Manual therapy, [phone]- Gait training, [phone]- Orthotic Fit/training, [phone]- Aquatic Therapy, [phone]- Ionotophoresis 4mg /ml Dexamethasone, Patient/Family education, Balance training, Stair training, Taping, Dry Needling, Joint mobilization, DME instructions, Cryotherapy, and Moist heat  PLAN FOR NEXT SESSION: aquatic: pain management; strengthening general; aerobic capacity training,posture and balance; HEP Land: HEP for core and LE strengthening, posture retraining.  HEP; Be mindful of hypermobility 1-2 visits Lorayne Bender, PT, DPT  06/23/23 11:48 AM [facility] [address] [place], [place], [address] Phone: [phone]   Fax:  [phone]    For all possible CPT codes, reference the Planned Interventions line above.     Check all conditions that are expected to impact treatment: {Conditions expected to impact treatment:Musculoskeletal disorders and Neurological condition and/or seizures   If treatment provided at initial evaluation, no treatment charged due to lack of authorization.

## 2023-06-29 ENCOUNTER — Ambulatory Visit (HOSPITAL_BASED_OUTPATIENT_CLINIC_OR_DEPARTMENT_OTHER): Payer: Medicaid Other | Attending: Internal Medicine

## 2023-06-29 ENCOUNTER — Telehealth (HOSPITAL_BASED_OUTPATIENT_CLINIC_OR_DEPARTMENT_OTHER): Payer: Self-pay

## 2023-06-29 DIAGNOSIS — M6281 Muscle weakness (generalized): Secondary | ICD-10-CM | POA: Insufficient documentation

## 2023-06-29 DIAGNOSIS — R2689 Other abnormalities of gait and mobility: Secondary | ICD-10-CM | POA: Insufficient documentation

## 2023-06-29 DIAGNOSIS — M797 Fibromyalgia: Secondary | ICD-10-CM | POA: Insufficient documentation

## 2023-06-29 NOTE — Progress Notes (Signed)
BH MD/PA/NP OP Progress Note  06/30/2023 4:57 PM Carolyn Miranda  MRN:  [id number]  Visit Diagnosis:    ICD-10-CM   1. Social anxiety disorder  F40.10 LORazepam (ATIVAN) 0.5 MG tablet    DULoxetine (CYMBALTA) 30 MG capsule    2. GAD (generalized anxiety disorder)  F41.1 LORazepam (ATIVAN) 0.5 MG tablet    DULoxetine (CYMBALTA) 30 MG capsule    3. Fibromyalgia  M79.7 LORazepam (ATIVAN) 0.5 MG tablet    DULoxetine (CYMBALTA) 30 MG capsule      Assessment: Carolyn Miranda is a 32 y.o. female with a history of anxiety, a seizure disorder, and chronic pain who presented to [facility] at [facility] for initial evaluation on 05/05/22.    During initial evaluation patient reported symptoms of anxiety including constant worry, racing thoughts, ruminations on past interactions, feeling overwhelmed, and impending dread for social interactions.  Patient endorsed experiencing restlessness secondary to her anxiety.  Patient denied any history of mania, psychosis, paranoia, SI/HI, or delusions.  Patient questioned diagnosis of ADHD and reported having done well in both school growing up and work more recently.  Of note patient was diagnosed with a seizure disorder in 2021 and there is concern of a possible autoimmune condition due to her elevated ANA.  She is scheduled with a rheumatologist for this and her chronic pain in December.  Patient met criteria for diagnosis of generalized anxiety disorder and social anxiety disorder and would benefit from medication management and connection with therapy.  While diagnosis of ADHD cannot be ruled out it does seem less likely as potential symptoms are more likely to be related to her anxiety.  Carolyn Miranda presents for follow-up evaluation. Today, 06/30/23, reports an increase in anxiety, ruminations, negative self thoughts, and panic in the interim.  These appeared to be related to increased financial stress and difficulty adapting to  her new workplace.  Patient has been taking Cymbalta 60 consistently and following with the propranolol with some affect.  She has not increased the Cymbalta as previously instructed.  We will plan to titrate Cymbalta at the rate we had previously discussed going forward.  We will also start Ativan 0.5 mg daily as needed for breakthrough anxiety.  Risk and benefits of this medication were reviewed.  Supportive interventions were employed and patient has connected with a therapist in the interim.  We will follow up in a month.  Plan: - Increase Cymbalta to 60 mg QD and 30 mg QHS for 7 days before increasing to 60 mg BID - Continue propranolol to 10 mg TID prn for anxiety - Start Ativan 0.5 mg daily prn for anxiety second line - Continue Topamax 200 mg QD for seizures managed by her neurologist - CBC, CMP, TSH, ANA reviewed - Has an upcoming appointment with a therapist - Follow up in a month  Chief Complaint:  Chief Complaint  Patient presents with   Follow-up   HPI: Patient presents reporting that she is not doing great and is having a really hard time right now. Carolyn Miranda has felt really overwhelmed with everything. 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 She was hopeful that it would be an alright job even though it was part. But she has been having a really hard time getting used to the system there. Carolyn Miranda feels like ever since she started there she is not able to do anything right. This has been made a bit  worse by her manager who has been nit picking at her.  Carolyn Miranda is particularly frustrated as she went to high school with her Production designer, theatre/television/film.  On Black Friday she had a panic attack which she is really embarrassed about.  Patient has been experiencing a lot of thoughts that she is worthless, failure, and week for her inability to control anxiety symptoms.  Of note this is her second panic attack in the last several months.  She has been taking the propranolol  consistently prior to work and it has good effect most of time.  Though during periods of increased stress there can be breakthrough anxiety which does lead to panic.  This excess anxiety is likely contributing to her difficulty focusing at work as well.  Patient also endorsed some financial stressors as the job currently is part-time and does not pay terribly well.  Due to that she had to have financial support from her father however he has informed her that he can no longer do this going forward.  Patient can find herself ruminating in bed on her days off which can leave her feeling more overwhelmed.  We provided support and worked on cognitive reframing today.  Patient was encouraged to take things 1 at a time and try to stay in the moment.  This continued perseverance on everything that needs to be done in the future will result in her feeling more overwhelmed going forward.  In regards to the Cymbalta the patient did not increase the dose as instructed.  After discussion we agreed to proceed forward with the prior plan to titrate Cymbalta to 60 mg twice daily over the next week.  We also suggested adding a second line as needed medication for breakthrough anxiety symptoms.  Ativan was discussed for this and risk and benefits were reviewed.  Patient has connected with a therapist and just need to submit her insurance to have an appointment set up.  Past Psychiatric History: Has been on medications in the past. Has tried Wellbutrin, Zoloft, possibly Prozac. Lamictal got a rash. Tried BuSpar which she think helped some.  Patient had a therapist in the past which she thought was helpful.  Increase Cymbalta to 60 mg daily on 06/04/2022.  Past Medical History:  Past Medical History:  Diagnosis Date   Allergies    Anxiety    Asthma    Depression    Migraines    Seizure (HCC)    Urine incontinence     Past Surgical History:  Procedure Laterality Date   BREAST REDUCTION SURGERY Bilateral  07/08/2017   WISDOM TOOTH EXTRACTION      Family Psychiatric History: Denies  Family History:  Family History  Problem Relation Age of Onset   Mental illness Mother    Drug abuse Mother        overmedicating rx by psych   Miscarriages / [place] Mother    Depression Mother    Arthritis Mother    Diabetes Father    Arthritis Maternal Grandmother    Asthma Maternal Grandmother     Social History:  Social History   Socioeconomic History   Marital status: Single    Spouse name: Not on file   Number of children: Not on file   Years of education: Not on file   Highest education level: Not on file  Occupational History   Not on file  Tobacco Use   Smoking status: Never    Passive exposure: Current   Smokeless tobacco: Never  Vaping  Use   Vaping status: Never Used  Substance and Sexual Activity   Alcohol use: Not Currently    Comment: socially   Drug use: No   Sexual activity: Not on file  Other Topics Concern   Not on file  Social History Narrative   Not on file   Social Determinants of Health   Financial Resource Strain: Not on file  Food Insecurity: Not on file  Transportation Needs: Not on file  Physical Activity: Not on file  Stress: Not on file  Social Connections: Not on file    Allergies:  Allergies  Allergen Reactions   Bupropion Nausea And Vomiting and Other (See Comments)   Celebrex [Celecoxib] Other (See Comments)    Caused wheezing    Lamotrigine Rash    Rash on left side of face Rash on left side of face Rash on left side of face Rash on left side of face    Celexa [Citalopram Hydrobromide] Diarrhea and Nausea And Vomiting   Other     Dairy     Current Medications: Current Outpatient Medications  Medication Sig Dispense Refill   LORazepam (ATIVAN) 0.5 MG tablet Take 1 tablet (0.5 mg total) by mouth daily as needed for anxiety. Second line after propranolol 30 tablet 0   albuterol (VENTOLIN HFA) 108 (90 Base) MCG/ACT inhaler Inhale  1 puff into the lungs every 4 (four) hours. 18 g 3   albuterol (VENTOLIN HFA) 108 (90 Base) MCG/ACT inhaler Inhale 2 puffs into the lungs every 6 (six) hours as needed for wheezing or shortness of breath. 18 g 0   clindamycin (CLINDAGEL) 1 % gel APPLY A THIN LAYER TO THE AFFECTED AREA(S) BY TOPICAL ROUTE 2 TIMES PER DAY 30 g 3   cyclobenzaprine (FLEXERIL) 5 MG tablet Take 1 tablet (5 mg total) by mouth at bedtime as needed for muscle spasms. 30 tablet 1   diclofenac Sodium (VOLTAREN ARTHRITIS PAIN) 1 % GEL Apply 4 g topically 4 (four) times daily. 150 g 3   DULoxetine (CYMBALTA) 30 MG capsule Take 1 capsule (30 mg total) by mouth daily. Take in the evening for 7 days before starting the 60 mg dose 7 capsule 0   DULoxetine (CYMBALTA) 60 MG capsule Take 1 capsule (60 mg total) by mouth 2 (two) times daily. Take 60 mg in the morning and 30 mg capsule for 7 days before increasing to 60 mg 2 times daily 60 capsule 2   fluticasone (FLONASE) 50 MCG/ACT nasal spray Place 2 sprays into both nostrils daily. 16 g 6   fluticasone-salmeterol (ADVAIR) 100-50 MCG/ACT AEPB Inhale 1 puff into the lungs 2 (two) times daily. 60 each 1   pantoprazole (PROTONIX) 40 MG tablet Take 1 tablet (40 mg total) by mouth daily. 30 tablet 1   predniSONE (DELTASONE) 20 MG tablet Take 2 tablets (40 mg total) by mouth daily with breakfast. 10 tablet 0   pregabalin (LYRICA) 100 MG capsule Take 1 capsule (100 mg total) by mouth 2 (two) times daily. 100 capsule 3   propranolol (INDERAL) 10 MG tablet Take 1 tablet (10 mg total) by mouth 3 (three) times daily as needed (anxiety). 90 tablet 2   topiramate (TOPAMAX) 100 MG tablet Take 2 tablets (200 mg total) by mouth daily at night. 180 tablet 3   No current facility-administered medications for this visit.     Musculoskeletal: Strength & Muscle Tone: within normal limits Gait & Station: normal Patient leans: N/A  Psychiatric Specialty Exam: Review of  Systems  There were no vitals  taken for this visit.There is no height or weight on file to calculate BMI.  General Appearance: Fairly Groomed  Eye Contact:  Good  Speech:  Clear and Coherent and Normal Rate  Volume:  Normal  Mood:  Anxious and Depressed  Affect:  Labile, Full Range, and Tearful  Thought Process:  Coherent and Linear  Orientation:  Full (Time, Place, and Person)  Thought Content: Logical   Suicidal Thoughts:  No  Homicidal Thoughts:  No  Memory:  Immediate;   Good  Judgement:  Fair  Insight:  Fair  Psychomotor Activity:  Normal  Concentration:  Concentration: Good  Recall:  Good  Fund of Knowledge: Good  Language: Good  Akathisia:  NA    AIMS (if indicated): not done  Assets:  Communication Skills Desire for Improvement Housing Social Support  ADL's:  Intact  Cognition: WNL  Sleep:  Fair   Metabolic Disorder Labs: Lab Results  Component Value Date   HGBA1C 4.7 01/16/2015   No results found for: "PROLACTIN" Lab Results  Component Value Date   CHOL 204 (H) 01/16/2015   TRIG 358.0 (H) 01/16/2015   HDL 47.80 01/16/2015   CHOLHDL 4 01/16/2015   VLDL 71.6 (H) 01/16/2015   Lab Results  Component Value Date   TSH 2.39 04/02/2022   TSH 4.54 (H) 01/16/2015    Therapeutic Level Labs: No results found for: "LITHIUM" No results found for: "VALPROATE" No results found for: "CBMZ"   Screenings: PHQ2-9    Flowsheet Row Office Visit from 05/26/2023 in [facility] Of [place] [facility] Office Visit from 05/06/2023 in [facility] [place] Office Visit from 03/24/2023 in [facility] - A Dept Of [facility] Office Visit from 12/19/2022 in [facility] A Dept Of [facility] Office Visit from 11[facility]21/2022 in [facility]  PHQ-2 Total Score 4 3 4 3  0  PHQ-9 Total Score -- 14 -- 16 --       Collaboration of Care:  Collaboration of Care: Medication Management AEB medication prescription and Other provider involved in patient's care AEB internal med and rheumatology chart review  Patient/Guardian was advised Release of Information must be obtained prior to any record release in order to collaborate their care with an outside provider. Patient/Guardian was advised if they have not already done so to contact the registration department to sign all necessary forms in order for [place] to release information regarding their care.   Consent: Patient/Guardian gives verbal consent for treatment and assignment of benefits for services provided during this visit. Patient/Guardian expressed understanding and agreed to proceed.    Virtual Visit via Video Note  I connected with Fern Flaten on 06/30/23 at 4:00 PM EDT by a video enabled telemedicine application and verified that I am speaking with the correct person using two identifiers.  Location: Patient: Home Provider: Home Office   I discussed the limitations of evaluation and management by telemedicine and the availability of in person appointments. The patient expressed understanding and agreed to proceed.   I discussed the assessment and treatment plan with the patient. The patient was provided an opportunity to ask questions and all were answered. The patient agreed with the plan and demonstrated an understanding of the instructions.   The patient was advised to call back  or seek an in-person evaluation if the symptoms worsen or if the condition fails to improve as anticipated.  40 minutes were spent in chart review, interview, psycho education, counseling, medical decision making, coordination of care and long-term prognosis.  Patient was given opportunity to ask question and all concerns and questions were addressed and answers. Excluding separately billable services.  Stasia Cavalier, MD    Stasia Cavalier, MD 06/30/2023, 4:57 PM

## 2023-06-29 NOTE — Therapy (Incomplete)
OUTPATIENT PHYSICAL THERAPY LOWER EXTREMITY TREATMENT   Patient Name: Carolyn Miranda MRN: [id number] DOB:[birth year], 32 y.o., female Today's Date: 06/29/2023  END OF SESSION:    Past Medical History:  Diagnosis Date   Allergies    Anxiety    Asthma    Depression    Migraines    Seizure (HCC)    Urine incontinence    Past Surgical History:  Procedure Laterality Date   BREAST REDUCTION SURGERY Bilateral 07/08/2017   WISDOM TOOTH EXTRACTION     Patient Active Problem List   Diagnosis Date Noted   Trochanteric bursitis, left hip 04/08/2023   Plantar fasciitis of left foot 04/08/2023   Myofascial pain 10/06/2022   Gastroesophageal reflux disease 06/04/2022   Social anxiety disorder 05/05/2022   GAD (generalized anxiety disorder) 05/05/2022   Antinuclear antibody (ANA) titer greater than 1:80 04/08/2022   Back pain 04/02/2022   Fibromyalgia 04/02/2022   Pelvic pain 04/02/2022   Bilateral hand pain 04/02/2022   Seizures (HCC) 11/02/2019   Encounter for monitoring anticonvulsant therapy 11/01/2019   Allergic rhinitis 03/26/2018   Neck pain 12/27/2015   Encounter for birth control 08/24/2015   Asthma 04/20/2015   Adjustment disorder with mixed anxiety and depressed mood 01/16/2015   Obesity (BMI 30-39.9) 01/16/2015    PCP: Hetty Blend NP-C  REFERRING PROVIDER: Fuller Plan, MD Fanny Dance, MD   REFERRING DIAG: Fibromyalgia   THERAPY DIAG:  No diagnosis found.  Rationale for Evaluation and Treatment: Rehabilitation  ONSET DATE: 1 year dx  SUBJECTIVE:   SUBJECTIVE STATEMENT: Patient reports she feels like the pool therapy is really helped.  She feels like she was doing better with her overall posture.  She continues to have pain.  She feels that her low back still continues to be disorganized.  Gluteals.  She is also having significant pain on the bottom of her foot.   POOL ACCESS:  still considering her options; will talk with dad about  [employer] membership  PERTINENT HISTORY: -Diagnosed with fibromyalgia December 2023 LE;LB;L shoulder, pelvic floor -Celebrex which was helping left hip trochanteric bursitis and plantar fasciitis of her feet  PAIN:  Are you having pain? Yes: NPRS scale: 7/10  Pain location:  lower back Pain description: sharp, burning, stabbing, and aching constant Aggravating factors: standing >15; sitting extended periods time; walking >15 minutes Relieving factors: ice/heat; CBD gummies.  PRECAUTIONS: None  RED FLAGS: None   WEIGHT BEARING RESTRICTIONS: No  FALLS:  Has patient fallen in last 6 months? Yes 1 ankle rolled  LIVING ENVIRONMENT: Lives with: lives with their family Lives in: House/apartment Stairs: Yes: External: 4 steps; on right going up Has following equipment at home: None  OCCUPATION: cashier  PLOF: Independent  PATIENT GOALS: help hypermobility, learn to exercises, manage my pain  NEXT MD VISIT: 06/17/23  OBJECTIVE:  Note: Objective measures were completed at Evaluation unless otherwise noted.  DIAGNOSTIC FINDINGS: not recent  PATIENT SURVEYS:  LEFS 34/80  COGNITION: Overall cognitive status: Within functional limits for tasks assessed     SENSATION: wfl   MUSCLE LENGTH:    POSTURE: rounded shoulders, posterior pelvic tilt, and flexed trunk    LUMBAR ROM:   Hypermobile  LOWER EXTREMITY ROM:  Active ROM Right eval Left eval  Hip flexion    Hip extension    Hip abduction    Hip adduction    Hip internal rotation    Hip external rotation    Knee flexion    Knee extension  hyper hyper  Ankle dorsiflexion    Ankle plantarflexion    Ankle inversion    Ankle eversion     (Blank rows = not tested)  LOWER EXTREMITY MMT:  MMT Right eval Left eval  Hip flexion 52.5 46.5  Hip extension    Hip abduction 40.7 29.1  Hip adduction    Hip internal rotation    Hip external rotation    Knee flexion    Knee extension 44.1 41.6  Ankle dorsiflexion     Ankle plantarflexion    Ankle inversion    Ankle eversion     (Blank rows = not tested)   FUNCTIONAL TESTS:  5 times sit to stand: 24.80 Timed up and go (TUG): 11.25 4 stages:pass  GAIT: Distance walked: 500 ft Assistive device utilized: None Level of assistance: Complete Independence Comments: wfl   TODAY'S TREATMENT:                                                                                                                              11/26 .  Worked on developing patient initial lab program.  NuStep 5 minutes level 3  Ball roll out for back stretch Forward 5 x 10-second hold 5 x 10-second hold lateral Ball press with abdominal breathing 2 x 10  Viewed self trigger point release.  Shown how to use Thera cane on low back, upper traps, and her foot.  Also shown how to use tennis ball for self trigger point release.  Reviewed PNE concepts  Shoulder extension 3 x 10 Scap retraction 3 x 10 both with yellow   Last visit:  Pt seen for aquatic therapy today.  Treatment took place in water 3.5-4.75 ft in depth at the [facility] pool. Temp of water was 91.  Pt entered/exited the pool via stairs independently with bilat rail.  * Straddling noodle, UE on wall in corner:  cycling, suspended hip abdct/ addct and cross country ski * unsupported walking forward/ backward x 2 laps * marching forward / backward with reciprocal row with rainbow hand floats, cues for technique and upright posture * side stepping with arm addct/ abdct with rainbow hand floats x 3 lap * farmer carry with bilat rainbow hand floats under water at side, walking forward/ backward * hands on bench in water-> plank with alternating hip ext x 10; slow mountain climbers x 5;  fire hydrants x 5 each  * TrA set with solid noodle pull down to thighs x 10  * seated balance on solid noodle - attempts at single hand out of water * 3 way LE stretch with ankle on solid noodle   PATIENT EDUCATION:   Education details: aquatic therapy progressions/ modifications Person educated: Patient Education method: Explanation Education comprehension: verbalized understanding  HOME EXERCISE PROGRAM: TBA  ASSESSMENT:  CLINICAL IMPRESSION: Patient tolerated treatment very well.  She is very motivated to improve strength and work on her exercise program.  We reviewed at  activities that we will reduce her pain this visit.  We also added in a slight postural strengthening exercise program.  Will continue to expand exercises.  She is advised to pick 2 stretches in order trigger point release activities that improve her pain the most.  Will expand her exercises next visit.  We reviewed PNE concepts such as her versus harm and decreases sensitivity of central nervous system through light activity.  Will continue to develop her program wherever she wants to exercise.  She is advised that she plans on joining the gym we will work her in the gym exercises.   From Initial evaluation:  Patient is a 32 y.o. f who was seen today for physical therapy evaluation and treatment for Fibromyalgia . She presents today with her mother. Pt has multiple co-morbidities which are chronic and pain inducing for which she receiving care for from [facility] specialties. She reports chronic pain back to when she was a child but just recently dx with fibromyalgia.  Main complaint today is overall pain which she is wanting to better manage as well as gain strength to have improved toleration to all activity.  Her pain sensitivity is moderate and constant. She reports limitations with walking and standing as well as sitting in one position too long. Testing demonstrates hypermobility throughout, marked in knees, elbows and spine. She has strength deficits L>R.  OBJECTIVE IMPAIRMENTS: decreased endurance, decreased mobility, difficulty walking, decreased strength, obesity, and hypermobility .   ACTIVITY LIMITATIONS: carrying, lifting,  bending, sitting, standing, squatting, and stairs  PARTICIPATION LIMITATIONS: community activity, occupation, and yard work  PERSONAL FACTORS: Age, Fitness, Past/current experiences, Time since onset of injury/illness/exacerbation, and 3+ comorbidities: see PMHx  are also affecting patient's functional outcome.   REHAB POTENTIAL: Good  CLINICAL DECISION MAKING: Evolving/moderate complexity  EVALUATION COMPLEXITY: Moderate   GOALS: Goals reviewed with patient? Yes  SHORT TERM GOALS: Target date: 06/17/23 Pt will tolerate full aquatic sessions consistently without increase in pain and with improving function to demonstrate good toleration and effectiveness of intervention.  Baseline: Goal status: MET - 06/16/23  2.  Pt will understand the benefits of aquatic therapy/intervention on long term pain management of chronic conditions and able to find pool access  Baseline:  Goal status:IN PROGRESS -06/16/23  3.  Pt will tolerate walking to and from setting and engaging in aquatic therapy session without excessive fatigue or increase in pain to demonstrate improved toleration to activity.  Baseline:  Goal status:MET - 06/18/23    LONG TERM GOALS: Target date: 07/24/23  Pt will improve by at least 9 points on LEFS to demonstrate improved function Baseline:34/80  Goal status: INITIAL  2.  Pt will be indep with final HEP's (land and aquatic as appropriate) for continued management of condition Baseline:  Goal status: INITIAL  3.  Pt will improve strength of LLE to be with in 5 lb of contralateral side to demonstrate improved overall physical function Baseline:  Goal status: INITIAL  4.  Pt will improve on 5 X STS test to <or= 16s to demonstrate improving functional lower extremity strength, transitional movements, and balance Baseline: 24.80 Goal status: INITIAL  5.  Pt will report overall reduction in pain to by at least 2 NPRS for improved toleration to activity/quality of  life and to demonstrate improved management of pain. Baseline: see chart Goal status: INITIAL   PLAN:  PT FREQUENCY: 1-2x/week  PT DURATION: 8 weeks 12 visits  PLANNED INTERVENTIONS: 97164- PT Re-evaluation, 97110-Therapeutic exercises, 97530-  Therapeutic activity, [id number]- Neuromuscular re-education, [phone]- Self Care, 47425- Manual therapy, [phone]- Gait training, [phone]- Orthotic Fit/training, [phone]- Aquatic Therapy, [phone]- Ionotophoresis 4mg /ml Dexamethasone, Patient/Family education, Balance training, Stair training, Taping, Dry Needling, Joint mobilization, DME instructions, Cryotherapy, and Moist heat  PLAN FOR NEXT SESSION: aquatic: pain management; strengthening general; aerobic capacity training,posture and balance; HEP Land: HEP for core and LE strengthening, posture retraining.  HEP; Be mindful of hypermobility 1-2 visits Lorayne Bender, PT, DPT  06/29/23 9:33 AM [facility] [address] [place], [place], [address] Phone: [phone]   Fax:  [phone]    For all possible CPT codes, reference the Planned Interventions line above.     Check all conditions that are expected to impact treatment: {Conditions expected to impact treatment:Musculoskeletal disorders and Neurological condition and/or seizures   If treatment provided at initial evaluation, no treatment charged due to lack of authorization.

## 2023-06-29 NOTE — Telephone Encounter (Signed)
Called pt and left voicemail regarding missed appointment today. Reminded pt of next schedule visit and to call if she needs to cancel.

## 2023-06-30 ENCOUNTER — Other Ambulatory Visit (HOSPITAL_COMMUNITY): Payer: Self-pay

## 2023-06-30 ENCOUNTER — Telehealth (HOSPITAL_COMMUNITY): Payer: Medicaid Other | Admitting: Psychiatry

## 2023-06-30 DIAGNOSIS — M797 Fibromyalgia: Secondary | ICD-10-CM

## 2023-06-30 DIAGNOSIS — F411 Generalized anxiety disorder: Secondary | ICD-10-CM

## 2023-06-30 DIAGNOSIS — F4010 Social phobia, unspecified: Secondary | ICD-10-CM | POA: Diagnosis not present

## 2023-06-30 MED ORDER — LORAZEPAM 0.5 MG PO TABS
0.5000 mg | ORAL_TABLET | Freq: Every day | ORAL | 0 refills | Status: DC | PRN
Start: 2023-06-30 — End: 2023-09-17
  Filled 2023-06-30: qty 30, 30d supply, fill #0

## 2023-06-30 MED ORDER — DULOXETINE HCL 30 MG PO CPEP
30.0000 mg | ORAL_CAPSULE | Freq: Every day | ORAL | 0 refills | Status: DC
  Filled 2023-06-30: qty 7, 7d supply, fill #0

## 2023-07-01 ENCOUNTER — Other Ambulatory Visit (HOSPITAL_COMMUNITY): Payer: Self-pay

## 2023-07-01 ENCOUNTER — Encounter (HOSPITAL_COMMUNITY): Payer: Self-pay | Admitting: Psychiatry

## 2023-07-02 ENCOUNTER — Other Ambulatory Visit (HOSPITAL_COMMUNITY): Payer: Self-pay

## 2023-07-03 ENCOUNTER — Ambulatory Visit (HOSPITAL_BASED_OUTPATIENT_CLINIC_OR_DEPARTMENT_OTHER): Payer: Medicaid Other | Admitting: Physical Therapy

## 2023-07-03 ENCOUNTER — Encounter (HOSPITAL_BASED_OUTPATIENT_CLINIC_OR_DEPARTMENT_OTHER): Payer: Self-pay | Admitting: Physical Therapy

## 2023-07-03 DIAGNOSIS — M797 Fibromyalgia: Secondary | ICD-10-CM | POA: Diagnosis present

## 2023-07-03 DIAGNOSIS — R2689 Other abnormalities of gait and mobility: Secondary | ICD-10-CM

## 2023-07-03 DIAGNOSIS — M6281 Muscle weakness (generalized): Secondary | ICD-10-CM

## 2023-07-03 NOTE — Therapy (Signed)
OUTPATIENT PHYSICAL THERAPY LOWER EXTREMITY TREATMENT   Patient Name: Carolyn Miranda MRN: [id number] DOB:28-Mar-1991, 32 y.o., female Today's Date: 07/03/2023  END OF SESSION:  PT End of Session - 07/03/23 0955     Visit Number 7    Number of Visits 12    Date for PT Re-Evaluation 07/24/23    Authorization Time Period 10 visits from 10/30-12/29    PT Start Time 0950    PT Stop Time 1030    PT Time Calculation (min) 40 min    Activity Tolerance Patient tolerated treatment well    Behavior During Therapy [facility] for tasks assessed/performed              Past Medical History:  Diagnosis Date   Allergies    Anxiety    Asthma    Depression    Migraines    Seizure (HCC)    Urine incontinence    Past Surgical History:  Procedure Laterality Date   BREAST REDUCTION SURGERY Bilateral 07/08/2017   WISDOM TOOTH EXTRACTION     Patient Active Problem List   Diagnosis Date Noted   Trochanteric bursitis, left hip 04/08/2023   Plantar fasciitis of left foot 04/08/2023   Myofascial pain 10/06/2022   Gastroesophageal reflux disease 06/04/2022   Social anxiety disorder 05/05/2022   GAD (generalized anxiety disorder) 05/05/2022   Antinuclear antibody (ANA) titer greater than 1:80 04/08/2022   Back pain 04/02/2022   Fibromyalgia 04/02/2022   Pelvic pain 04/02/2022   Bilateral hand pain 04/02/2022   Seizures (HCC) 11/02/2019   Encounter for monitoring anticonvulsant therapy 11/01/2019   Allergic rhinitis 03/26/2018   Neck pain 12/27/2015   Encounter for birth control 08/24/2015   Asthma 04/20/2015   Adjustment disorder with mixed anxiety and depressed mood 01/16/2015   Obesity (BMI 30-39.9) 01/16/2015    PCP: Hetty Blend NP-C  REFERRING PROVIDER: Fuller Plan, MD Fanny Dance, MD   REFERRING DIAG: Fibromyalgia   THERAPY DIAG:  Fibromyalgia  Muscle weakness (generalized)  Other abnormalities of gait and mobility  Rationale for Evaluation and  Treatment: Rehabilitation  ONSET DATE: 1 year dx  SUBJECTIVE:   SUBJECTIVE STATEMENT: Left leg feeling stiff and lazy  POOL ACCESS:  still considering her options; will talk with dad about [employer] membership  PERTINENT HISTORY: -Diagnosed with fibromyalgia December 2023 LE;LB;L shoulder, pelvic floor -Celebrex which was helping left hip trochanteric bursitis and plantar fasciitis of her feet  PAIN:  Are you having pain? Yes: NPRS scale: 5/10  Pain location:  lower back Pain description: sharp, burning, stabbing, and aching constant Aggravating factors: standing >15; sitting extended periods time; walking >15 minutes Relieving factors: ice/heat; CBD gummies.  PRECAUTIONS: None  RED FLAGS: None   WEIGHT BEARING RESTRICTIONS: No  FALLS:  Has patient fallen in last 6 months? Yes 1 ankle rolled  LIVING ENVIRONMENT: Lives with: lives with their family Lives in: House/apartment Stairs: Yes: External: 4 steps; on right going up Has following equipment at home: None  OCCUPATION: cashier  PLOF: Independent  PATIENT GOALS: help hypermobility, learn to exercises, manage my pain  NEXT MD VISIT: 06/17/23  OBJECTIVE:  Note: Objective measures were completed at Evaluation unless otherwise noted.  DIAGNOSTIC FINDINGS: not recent  PATIENT SURVEYS:  LEFS 34/80  COGNITION: Overall cognitive status: Within functional limits for tasks assessed     SENSATION: wfl   MUSCLE LENGTH:    POSTURE: rounded shoulders, posterior pelvic tilt, and flexed trunk    LUMBAR ROM:   Hypermobile  LOWER EXTREMITY ROM:  Active ROM Right eval Left eval  Hip flexion    Hip extension    Hip abduction    Hip adduction    Hip internal rotation    Hip external rotation    Knee flexion    Knee extension hyper hyper  Ankle dorsiflexion    Ankle plantarflexion    Ankle inversion    Ankle eversion     (Blank rows = not tested)  LOWER EXTREMITY MMT:  MMT Right eval Left eval   Hip flexion 52.5 46.5  Hip extension    Hip abduction 40.7 29.1  Hip adduction    Hip internal rotation    Hip external rotation    Knee flexion    Knee extension 44.1 41.6  Ankle dorsiflexion    Ankle plantarflexion    Ankle inversion    Ankle eversion     (Blank rows = not tested)   FUNCTIONAL TESTS:  5 times sit to stand: 24.80 Timed up and go (TUG): 11.25 4 stages:pass  GAIT: Distance walked: 500 ft Assistive device utilized: None Level of assistance: Complete Independence Comments: wfl   TODAY'S TREATMENT:                                                                                                                               07/03/23 Pt seen for aquatic therapy today.  Treatment took place in water 3.5-4.75 ft in depth at the [facility] pool. Temp of water was 91.  Pt entered/exited the pool via stairs independently with bilat rail.  * Straddling noodle, UE on wall in corner:  cycling, suspended hip abdct/ addct and cross country ski * unsupported walking forward/ backward x 2 laps * side stepping with arm addct/ abdct with rainbow hand floats  * farmer carry with bilat rainbow hand floats under water at side, walking forward/ backward * hands on bench in water-> plank with alternating hip ext x 10; slow mountain climbers x 5;  fire hydrants x 5 each  * TrA set with solid noodle pull down to thighs x 10 wide stance then staggered * seated balance on solid noodle - unilateral ue reach out of water. (Good challenge) * 3 way LE stretch with ankle on solid noodle not tolerated  Pt requires the buoyancy and hydrostatic pressure of water for support, and to offload joints by unweighting joint load by at least 50 % in navel deep water and by at least 75-80% in chest to neck deep water.  Viscosity of the water is needed for resistance of strengthening. Water current perturbations provides challenge to standing balance requiring increased core  activation.    11/26 .  Worked on developing patient initial lab program.  NuStep 5 minutes level 3  Ball roll out for back stretch Forward 5 x 10-second hold 5 x 10-second hold lateral Ball press with abdominal breathing 2 x 10  Viewed self trigger point release.  Shown how to use Thera cane on low back, upper traps, and her foot.  Also shown how to use tennis ball for self trigger point release.  Reviewed PNE concepts  Shoulder extension 3 x 10 Scap retraction 3 x 10 both with yellow   Last visit:    PATIENT EDUCATION:  Education details: aquatic therapy progressions/ modifications Person educated: Patient Education method: Explanation Education comprehension: verbalized understanding  HOME EXERCISE PROGRAM: TBA  ASSESSMENT:  CLINICAL IMPRESSION: Pt arrives with high pain sensitivity.  She reports she has been working standing for long time on feet. Reviewed and Completed exercises/activities to lower pain.  She responds well with reduction of pain today by 2 NPRS  as well as reports of good muscle engagement feeling exercised.  Avoided over stretching due to hypermobility. Missed last land session due to being at work. Goals ongoing     From Initial evaluation:  Patient is a 32 y.o. f who was seen today for physical therapy evaluation and treatment for Fibromyalgia . She presents today with her mother. Pt has multiple co-morbidities which are chronic and pain inducing for which she receiving care for from [facility] specialties. She reports chronic pain back to when she was a child but just recently dx with fibromyalgia.  Main complaint today is overall pain which she is wanting to better manage as well as gain strength to have improved toleration to all activity.  Her pain sensitivity is moderate and constant. She reports limitations with walking and standing as well as sitting in one position too long. Testing demonstrates hypermobility throughout, marked in knees,  elbows and spine. She has strength deficits L>R.  OBJECTIVE IMPAIRMENTS: decreased endurance, decreased mobility, difficulty walking, decreased strength, obesity, and hypermobility .   ACTIVITY LIMITATIONS: carrying, lifting, bending, sitting, standing, squatting, and stairs  PARTICIPATION LIMITATIONS: community activity, occupation, and yard work  PERSONAL FACTORS: Age, Fitness, Past/current experiences, Time since onset of injury/illness/exacerbation, and 3+ comorbidities: see PMHx  are also affecting patient's functional outcome.   REHAB POTENTIAL: Good  CLINICAL DECISION MAKING: Evolving/moderate complexity  EVALUATION COMPLEXITY: Moderate   GOALS: Goals reviewed with patient? Yes  SHORT TERM GOALS: Target date: 06/17/23 Pt will tolerate full aquatic sessions consistently without increase in pain and with improving function to demonstrate good toleration and effectiveness of intervention.  Baseline: Goal status: MET - 06/16/23  2.  Pt will understand the benefits of aquatic therapy/intervention on long term pain management of chronic conditions and able to find pool access  Baseline:  Goal status:IN PROGRESS -06/16/23  3.  Pt will tolerate walking to and from setting and engaging in aquatic therapy session without excessive fatigue or increase in pain to demonstrate improved toleration to activity.  Baseline:  Goal status:MET - 06/18/23    LONG TERM GOALS: Target date: 07/24/23  Pt will improve by at least 9 points on LEFS to demonstrate improved function Baseline:34/80  Goal status: INITIAL  2.  Pt will be indep with final HEP's (land and aquatic as appropriate) for continued management of condition Baseline:  Goal status: INITIAL  3.  Pt will improve strength of LLE to be with in 5 lb of contralateral side to demonstrate improved overall physical function Baseline:  Goal status: INITIAL  4.  Pt will improve on 5 X STS test to <or= 16s to demonstrate improving  functional lower extremity strength, transitional movements, and balance Baseline: 24.80 Goal status: INITIAL  5.  Pt will report overall reduction in pain to by at  least 2 NPRS for improved toleration to activity/quality of life and to demonstrate improved management of pain. Baseline: see chart Goal status: INITIAL   PLAN:  PT FREQUENCY: 1-2x/week  PT DURATION: 8 weeks 12 visits  PLANNED INTERVENTIONS: 97164- PT Re-evaluation, 97110-Therapeutic exercises, 97530- Therapeutic activity, 97112- Neuromuscular re-education, 97535- Self Care, 11914- Manual therapy, [phone]- Gait training, [phone]- Orthotic Fit/training, [phone]- Aquatic Therapy, [phone]- Ionotophoresis 4mg /ml Dexamethasone, Patient/Family education, Balance training, Stair training, Taping, Dry Needling, Joint mobilization, DME instructions, Cryotherapy, and Moist heat  PLAN FOR NEXT SESSION: aquatic: pain management; strengthening general; aerobic capacity training,posture and balance; HEP Land: HEP for core and LE strengthening, posture retraining.  HEP; Be mindful of hypermobility 1-2 visits  Rushie Chestnut) Foster Sonnier MPT 07/03/23 1:41 PM [facility] [address] [place], [place], [address] Phone: [phone]   Fax:  [phone]     For all possible CPT codes, reference the Planned Interventions line above.     Check all conditions that are expected to impact treatment: {Conditions expected to impact treatment:Musculoskeletal disorders and Neurological condition and/or seizures   If treatment provided at initial evaluation, no treatment charged due to lack of authorization.

## 2023-07-07 ENCOUNTER — Ambulatory Visit (HOSPITAL_BASED_OUTPATIENT_CLINIC_OR_DEPARTMENT_OTHER): Payer: Medicaid Other | Admitting: Physical Therapy

## 2023-07-07 ENCOUNTER — Encounter (HOSPITAL_BASED_OUTPATIENT_CLINIC_OR_DEPARTMENT_OTHER): Payer: Self-pay | Admitting: Physical Therapy

## 2023-07-07 DIAGNOSIS — R2689 Other abnormalities of gait and mobility: Secondary | ICD-10-CM

## 2023-07-07 DIAGNOSIS — M797 Fibromyalgia: Secondary | ICD-10-CM | POA: Diagnosis not present

## 2023-07-07 DIAGNOSIS — M6281 Muscle weakness (generalized): Secondary | ICD-10-CM

## 2023-07-07 NOTE — Therapy (Signed)
OUTPATIENT PHYSICAL THERAPY LOWER EXTREMITY TREATMENT   Patient Name: Carolyn Miranda MRN: [id number] DOB:1991-07-19, 32 y.o., female Today's Date: 07/07/2023  END OF SESSION:  PT End of Session - 07/07/23 1003     Visit Number 8    Number of Visits 12    Date for PT Re-Evaluation 07/24/23    Authorization Time Period 10 visits from 10/30-12/29    PT Start Time 0951    PT Stop Time 1030    PT Time Calculation (min) 39 min    Activity Tolerance Patient tolerated treatment well    Behavior During Therapy [facility] for tasks assessed/performed               Past Medical History:  Diagnosis Date   Allergies    Anxiety    Asthma    Depression    Migraines    Seizure (HCC)    Urine incontinence    Past Surgical History:  Procedure Laterality Date   BREAST REDUCTION SURGERY Bilateral 07/08/2017   WISDOM TOOTH EXTRACTION     Patient Active Problem List   Diagnosis Date Noted   Trochanteric bursitis, left hip 04/08/2023   Plantar fasciitis of left foot 04/08/2023   Myofascial pain 10/06/2022   Gastroesophageal reflux disease 06/04/2022   Social anxiety disorder 05/05/2022   GAD (generalized anxiety disorder) 05/05/2022   Antinuclear antibody (ANA) titer greater than 1:80 04/08/2022   Back pain 04/02/2022   Fibromyalgia 04/02/2022   Pelvic pain 04/02/2022   Bilateral hand pain 04/02/2022   Seizures (HCC) 11/02/2019   Encounter for monitoring anticonvulsant therapy 11/01/2019   Allergic rhinitis 03/26/2018   Neck pain 12/27/2015   Encounter for birth control 08/24/2015   Asthma 04/20/2015   Adjustment disorder with mixed anxiety and depressed mood 01/16/2015   Obesity (BMI 30-39.9) 01/16/2015    PCP: Hetty Blend NP-C  REFERRING PROVIDER: Fuller Plan, MD Fanny Dance, MD   REFERRING DIAG: Fibromyalgia   THERAPY DIAG:  Fibromyalgia  Muscle weakness (generalized)  Other abnormalities of gait and mobility  Rationale for Evaluation and  Treatment: Rehabilitation  ONSET DATE: 1 year dx  SUBJECTIVE:   SUBJECTIVE STATEMENT:  Pain in hips and thighs today 3/10 not back, back is good  POOL ACCESS:  still considering her options; will talk with dad about [employer] membership  PERTINENT HISTORY: -Diagnosed with fibromyalgia December 2023 LE;LB;L shoulder, pelvic floor -Celebrex which was helping left hip trochanteric bursitis and plantar fasciitis of her feet  PAIN:  Are you having pain? Yes: NPRS scale: 0/10  Pain location:  lower back Pain description: sharp, burning, stabbing, and aching constant Aggravating factors: standing >15; sitting extended periods time; walking >15 minutes Relieving factors: ice/heat; CBD gummies.  PRECAUTIONS: None  RED FLAGS: None   WEIGHT BEARING RESTRICTIONS: No  FALLS:  Has patient fallen in last 6 months? Yes 1 ankle rolled  LIVING ENVIRONMENT: Lives with: lives with their family Lives in: House/apartment Stairs: Yes: External: 4 steps; on right going up Has following equipment at home: None  OCCUPATION: cashier  PLOF: Independent  PATIENT GOALS: help hypermobility, learn to exercises, manage my pain  NEXT MD VISIT: 06/17/23  OBJECTIVE:  Note: Objective measures were completed at Evaluation unless otherwise noted.  DIAGNOSTIC FINDINGS: not recent  PATIENT SURVEYS:  LEFS 34/80  COGNITION: Overall cognitive status: Within functional limits for tasks assessed     SENSATION: wfl   MUSCLE LENGTH:    POSTURE: rounded shoulders, posterior pelvic tilt, and flexed trunk  LUMBAR ROM:   Hypermobile  LOWER EXTREMITY ROM:  Active ROM Right eval Left eval  Hip flexion    Hip extension    Hip abduction    Hip adduction    Hip internal rotation    Hip external rotation    Knee flexion    Knee extension hyper hyper  Ankle dorsiflexion    Ankle plantarflexion    Ankle inversion    Ankle eversion     (Blank rows = not tested)  LOWER EXTREMITY MMT:  MMT  Right eval Left eval  Hip flexion 52.5 46.5  Hip extension    Hip abduction 40.7 29.1  Hip adduction    Hip internal rotation    Hip external rotation    Knee flexion    Knee extension 44.1 41.6  Ankle dorsiflexion    Ankle plantarflexion    Ankle inversion    Ankle eversion     (Blank rows = not tested)   FUNCTIONAL TESTS:  5 times sit to stand: 24.80 Timed up and go (TUG): 11.25 4 stages:pass  GAIT: Distance walked: 500 ft Assistive device utilized: None Level of assistance: Complete Independence Comments: wfl   TODAY'S TREATMENT:                                                                                                                               07/07/23 Pt seen for aquatic therapy today.  Treatment took place in water 3.5-4.75 ft in depth at the [facility] pool. Temp of water was 91.  Pt entered/exited the pool via stairs independently with bilat rail.   * unsupported walking forward/ backward  * side stepping with arm addct/ abdct with rainbow hand floats  * farmer carry with unilateral rainbow hand floats under water at side, walking forward/ backward * Straddling noodle ue support HB:  cycling, suspended hip abdct/ addct and cross country ski * TrA set with solid noodle pull down to thighs x 10 wide stance then staggered  Pt requires the buoyancy and hydrostatic pressure of water for support, and to offload joints by unweighting joint load by at least 50 % in navel deep water and by at least 75-80% in chest to neck deep water.  Viscosity of the water is needed for resistance of strengthening. Water current perturbations provides challenge to standing balance requiring increased core activation.    11/26 .  Worked on developing patient initial lab program.  NuStep 5 minutes level 3  Ball roll out for back stretch Forward 5 x 10-second hold 5 x 10-second hold lateral Ball press with abdominal breathing 2 x 10  Viewed self trigger point  release.  Shown how to use Thera cane on low back, upper traps, and her foot.  Also shown how to use tennis ball for self trigger point release.  Reviewed PNE concepts  Shoulder extension 3 x 10 Scap retraction 3 x 10 both with yellow   Last visit:  PATIENT EDUCATION:  Education details: aquatic therapy progressions/ modifications Person educated: Patient Education method: Explanation Education comprehension: verbalized understanding  HOME EXERCISE PROGRAM: Aquatic This aquatic home exercise program from [employer] utilizes pictures from land based exercises, but has been adapted prior to lamination and issuance.   Access Code: [email or web address] URL: [email or web address] Date: 07/07/2023 Prepared by: Geni Bers  Exercises - Hand Buoy Carry  - 1 x daily - 1-3 x weekly - Noodle press  - 1 x daily - 1-3 x weekly - 1-2 sets - 10 reps - Drawing Bow  - 1 x daily - 1-3 x weekly - 1-2 sets - 10 reps - Standing Hip Hinge  - 1 x daily - 1-3 x weekly - 1-2 sets - 10 reps - Standing Hip Circles at [employer]  - 1 x daily - 1-3 x weekly - 1-2 sets - 10 reps - Seated Straddle on Flotation Forward Breast Stroke Arms and Bicycle Legs  - 1 x daily - 1-3 x weekly - Sitting Balance on Pool Noodle  - 1 x daily - 1-3 x weekly - 1-2 sets - 10 reps - Side Stepping with Hand Floats  - 1 x daily - 1-3 x weekly  ASSESSMENT:  CLINICAL IMPRESSION: LBP reduced today although hip and thigh pain.  Limited toleration to farmers carry. She has some difficulty following verbal instructions today maybe secondary to fatigue.  Pt has 2 more approved sessions.  HEP creation initiated today. Will plan on instructing and issuing in next visits. No pool access yet although she is able to identify the benefits of aquatic intervention as a long term pain management tool.  Will test 5x STS if time allows next session.  From Initial evaluation:  Patient is a 32 y.o. f who was seen today for physical therapy  evaluation and treatment for Fibromyalgia . She presents today with her mother. Pt has multiple co-morbidities which are chronic and pain inducing for which she receiving care for from [facility] specialties. She reports chronic pain back to when she was a child but just recently dx with fibromyalgia.  Main complaint today is overall pain which she is wanting to better manage as well as gain strength to have improved toleration to all activity.  Her pain sensitivity is moderate and constant. She reports limitations with walking and standing as well as sitting in one position too long. Testing demonstrates hypermobility throughout, marked in knees, elbows and spine. She has strength deficits L>R.  OBJECTIVE IMPAIRMENTS: decreased endurance, decreased mobility, difficulty walking, decreased strength, obesity, and hypermobility .   ACTIVITY LIMITATIONS: carrying, lifting, bending, sitting, standing, squatting, and stairs  PARTICIPATION LIMITATIONS: community activity, occupation, and yard work  PERSONAL FACTORS: Age, Fitness, Past/current experiences, Time since onset of injury/illness/exacerbation, and 3+ comorbidities: see PMHx  are also affecting patient's functional outcome.   REHAB POTENTIAL: Good  CLINICAL DECISION MAKING: Evolving/moderate complexity  EVALUATION COMPLEXITY: Moderate   GOALS: Goals reviewed with patient? Yes  SHORT TERM GOALS: Target date: 06/17/23 Pt will tolerate full aquatic sessions consistently without increase in pain and with improving function to demonstrate good toleration and effectiveness of intervention.  Baseline: Goal status: MET - 06/16/23  2.  Pt will understand the benefits of aquatic therapy/intervention on long term pain management of chronic conditions and able to find pool access  Baseline:  Goal status:IN PROGRESS -06/16/23; 07/07/23  3.  Pt will tolerate walking to and from setting and engaging in aquatic therapy session without excessive fatigue  or  increase in pain to demonstrate improved toleration to activity.  Baseline:  Goal status:MET - 06/18/23    LONG TERM GOALS: Target date: 07/24/23  Pt will improve by at least 9 points on LEFS to demonstrate improved function Baseline:34/80  Goal status: INITIAL  2.  Pt will be indep with final HEP's (land and aquatic as appropriate) for continued management of condition Baseline:  Goal status: INITIAL  3.  Pt will improve strength of LLE to be with in 5 lb of contralateral side to demonstrate improved overall physical function Baseline:  Goal status: INITIAL  4.  Pt will improve on 5 X STS test to <or= 16s to demonstrate improving functional lower extremity strength, transitional movements, and balance Baseline: 24.80 Goal status: INITIAL  5.  Pt will report overall reduction in pain to by at least 2 NPRS for improved toleration to activity/quality of life and to demonstrate improved management of pain. Baseline: see chart Goal status: INITIAL   PLAN:  PT FREQUENCY: 1-2x/week  PT DURATION: 8 weeks 12 visits  PLANNED INTERVENTIONS: 97164- PT Re-evaluation, 97110-Therapeutic exercises, 97530- Therapeutic activity, 97112- Neuromuscular re-education, 97535- Self Care, 16109- Manual therapy, [phone]- Gait training, [phone]- Orthotic Fit/training, [phone]- Aquatic Therapy, [phone]- Ionotophoresis 4mg /ml Dexamethasone, Patient/Family education, Balance training, Stair training, Taping, Dry Needling, Joint mobilization, DME instructions, Cryotherapy, and Moist heat  PLAN FOR NEXT SESSION: aquatic: pain management; strengthening general; aerobic capacity training,posture and balance; HEP Land: HEP for core and LE strengthening, posture retraining.  HEP; Be mindful of hypermobility 1-2 visits  Rushie Chestnut) Emali Heyward MPT 07/07/23 1:12 PM [facility] [address] [place], [place], [address] Phone: [phone]   Fax:   [phone]     For all possible CPT codes, reference the Planned Interventions line above.     Check all conditions that are expected to impact treatment: {Conditions expected to impact treatment:Musculoskeletal disorders and Neurological condition and/or seizures   If treatment provided at initial evaluation, no treatment charged due to lack of authorization.

## 2023-07-09 ENCOUNTER — Ambulatory Visit (HOSPITAL_BASED_OUTPATIENT_CLINIC_OR_DEPARTMENT_OTHER): Payer: Medicaid Other | Admitting: Physical Therapy

## 2023-07-14 ENCOUNTER — Encounter (HOSPITAL_BASED_OUTPATIENT_CLINIC_OR_DEPARTMENT_OTHER): Payer: Self-pay | Admitting: Physical Therapy

## 2023-07-14 ENCOUNTER — Ambulatory Visit (HOSPITAL_BASED_OUTPATIENT_CLINIC_OR_DEPARTMENT_OTHER): Payer: Medicaid Other | Admitting: Physical Therapy

## 2023-07-14 DIAGNOSIS — R2689 Other abnormalities of gait and mobility: Secondary | ICD-10-CM

## 2023-07-14 DIAGNOSIS — M797 Fibromyalgia: Secondary | ICD-10-CM

## 2023-07-14 DIAGNOSIS — M6281 Muscle weakness (generalized): Secondary | ICD-10-CM

## 2023-07-14 NOTE — Therapy (Signed)
OUTPATIENT PHYSICAL THERAPY LOWER EXTREMITY TREATMENT   Patient Name: Carolyn Miranda MRN: [id number] DOB:05-Apr-1991, 32 y.o., female Today's Date: 07/14/2023  END OF SESSION:  PT End of Session - 07/14/23 0906     Visit Number 9    Number of Visits 12    Date for PT Re-Evaluation 07/24/23    Authorization Time Period 10 visits from 10/30-12/29    PT Start Time 0907    PT Stop Time 0945    PT Time Calculation (min) 38 min               Past Medical History:  Diagnosis Date   Allergies    Anxiety    Asthma    Depression    Migraines    Seizure (HCC)    Urine incontinence    Past Surgical History:  Procedure Laterality Date   BREAST REDUCTION SURGERY Bilateral 07/08/2017   WISDOM TOOTH EXTRACTION     Patient Active Problem List   Diagnosis Date Noted   Trochanteric bursitis, left hip 04/08/2023   Plantar fasciitis of left foot 04/08/2023   Myofascial pain 10/06/2022   Gastroesophageal reflux disease 06/04/2022   Social anxiety disorder 05/05/2022   GAD (generalized anxiety disorder) 05/05/2022   Antinuclear antibody (ANA) titer greater than 1:80 04/08/2022   Back pain 04/02/2022   Fibromyalgia 04/02/2022   Pelvic pain 04/02/2022   Bilateral hand pain 04/02/2022   Seizures (HCC) 11/02/2019   Encounter for monitoring anticonvulsant therapy 11/01/2019   Allergic rhinitis 03/26/2018   Neck pain 12/27/2015   Encounter for birth control 08/24/2015   Asthma 04/20/2015   Adjustment disorder with mixed anxiety and depressed mood 01/16/2015   Obesity (BMI 30-39.9) 01/16/2015    PCP: Hetty Blend NP-C  REFERRING PROVIDER: Fuller Plan, MD Fanny Dance, MD   REFERRING DIAG: Fibromyalgia   THERAPY DIAG:  Fibromyalgia  Muscle weakness (generalized)  Other abnormalities of gait and mobility  Rationale for Evaluation and Treatment: Rehabilitation  ONSET DATE: 1 year dx  SUBJECTIVE:   SUBJECTIVE STATEMENT:  "I've been resting a lot,  the last couple days".   Pt reports it is getting easier walking down long hall to pool. She has been using her dad's hot tub.    POOL ACCESS: plans to apply for scholarship for [employer] membership  PERTINENT HISTORY: -Diagnosed with fibromyalgia December 2023 LE;LB;L shoulder, pelvic floor -Celebrex which was helping left hip trochanteric bursitis and plantar fasciitis of her feet  PAIN:  Are you having pain? Yes: NPRS scale: 3/10  Pain location:  lower back, shoulder blade Pain description: aching constant Aggravating factors: standing >15; sitting extended periods time; walking >15 minutes Relieving factors: ice/heat; CBD gummies.  PRECAUTIONS: None  RED FLAGS: None   WEIGHT BEARING RESTRICTIONS: No  FALLS:  Has patient fallen in last 6 months? Yes 1 ankle rolled  LIVING ENVIRONMENT: Lives with: lives with their family Lives in: House/apartment Stairs: Yes: External: 4 steps; on right going up Has following equipment at home: None  OCCUPATION: cashier  PLOF: Independent  PATIENT GOALS: help hypermobility, learn to exercises, manage my pain  NEXT MD VISIT: 06/17/23  OBJECTIVE:  Note: Objective measures were completed at Evaluation unless otherwise noted.  DIAGNOSTIC FINDINGS: not recent  PATIENT SURVEYS:  LEFS 34/80 07/14/23: LEFS  33/80  COGNITION: Overall cognitive status: Within functional limits for tasks assessed     SENSATION: wfl   MUSCLE LENGTH:    POSTURE: rounded shoulders, posterior pelvic tilt, and flexed trunk  LUMBAR ROM:   Hypermobile  LOWER EXTREMITY ROM:  Active ROM Right eval Left eval  Hip flexion    Hip extension    Hip abduction    Hip adduction    Hip internal rotation    Hip external rotation    Knee flexion    Knee extension hyper hyper  Ankle dorsiflexion    Ankle plantarflexion    Ankle inversion    Ankle eversion     (Blank rows = not tested)  LOWER EXTREMITY MMT:  MMT Right eval Left eval Left  12/17   Hip flexion 52.5 46.5 51.4  Hip extension     Hip abduction 40.7 29.1 38.5  Hip adduction     Hip internal rotation     Hip external rotation     Knee flexion     Knee extension 44.1 41.6   Ankle dorsiflexion     Ankle plantarflexion     Ankle inversion     Ankle eversion      (Blank rows = not tested)   FUNCTIONAL TESTS:  5 times sit to stand: 24.80 Timed up and go (TUG): 11.25 4 stages:pass  07/14/23:   5x STS: 14.63s  GAIT: Distance walked: 500 ft Assistive device utilized: None Level of assistance: Complete Independence Comments: wfl   TODAY'S TREATMENT:                                                                                                                              12/17 Pt seen for aquatic therapy today.  Treatment took place in water 3.5-4.75 ft in depth at the [facility] pool. Temp of water was 91.  Pt entered/exited the pool via stairs independently with bilat rail. * 5x STS, MMT * unsupported walking forward/ backward x 3 laps with reciprocal arm swing * side stepping with arm addct/ abdct with rainbow hand floats  x 4 laps   * staggered stance with boxing motions with light resistance bells 2 x 15; staggered stance with horiz abdct/ addct with light resistance bells x 10; then reciprocal arm swing x 10 * side to side pendulum holding yellow hand floats x 5 (challenge, cues for technique) * Straddling noodle and UE breast stroke:  cycling,3 laps * 3 way LE stretch with back against wall, solid noodle at ankle- 15sec x 2 reps each position, each leg  Pt requires the buoyancy and hydrostatic pressure of water for support, and to offload joints by unweighting joint load by at least 50 % in navel deep water and by at least 75-80% in chest to neck deep water.  Viscosity of the water is needed for resistance of strengthening. Water current perturbations provides challenge to standing balance requiring increased core activation.  07/07/23 Pt seen  for aquatic therapy today.  Treatment took place in water 3.5-4.75 ft in depth at the [facility] pool. Temp of water was 91.  Pt entered/exited the pool via stairs independently  with bilat rail. * unsupported walking forward/ backward  * side stepping with arm addct/ abdct with rainbow hand floats  * farmer carry with unilateral rainbow hand floats under water at side, walking forward/ backward * Straddling noodle ue support HB:  cycling, suspended hip abdct/ addct and cross country ski * TrA set with solid noodle pull down to thighs x 10 wide stance then staggered  11/26 Worked on developing patient initial lab program.  NuStep 5 minutes level 3  Ball roll out for back stretch Forward 5 x 10-second hold 5 x 10-second hold lateral Ball press with abdominal breathing 2 x 10  Viewed self trigger point release.  Shown how to use Thera cane on low back, upper traps, and her foot.  Also shown how to use tennis ball for self trigger point release.  Reviewed PNE concepts  Shoulder extension 3 x 10 Scap retraction 3 x 10 both with yellow     PATIENT EDUCATION:  Education details: aquatic therapy progressions/ modifications Person educated: Patient Education method: Explanation Education comprehension: verbalized understanding  HOME EXERCISE PROGRAM: Aquatic This aquatic home exercise program from [employer] utilizes pictures from land based exercises, but has been adapted prior to lamination and issuance.   Access Code: [email or web address] URL: [email or web address] Date: 07/07/2023   * NOT ISSUED YET Prepared by: Geni Bers  Exercises - Hand Buoy Carry  - 1 x daily - 1-3 x weekly - Noodle press  - 1 x daily - 1-3 x weekly - 1-2 sets - 10 reps - Drawing Bow  - 1 x daily - 1-3 x weekly - 1-2 sets - 10 reps - Standing Hip Hinge  - 1 x daily - 1-3 x weekly - 1-2 sets - 10 reps - Standing Hip Circles at [employer]  - 1 x daily - 1-3 x weekly - 1-2 sets - 10 reps -  Seated Straddle on Flotation Forward Breast Stroke Arms and Bicycle Legs  - 1 x daily - 1-3 x weekly - Sitting Balance on Pool Noodle  - 1 x daily - 1-3 x weekly - 1-2 sets - 10 reps - Side Stepping with Hand Floats  - 1 x daily - 1-3 x weekly  ASSESSMENT:  CLINICAL IMPRESSION: Overall good tolerance to aquatic therapy; pt noticing improved mobility and gait tolerance. 5x STS improved as well as LLE strength - has met LTG 3 and 4.   Pt has 1 more approved sessions; request additional sessions on next visit as pt voiced interest in transitioning to land based exercise. Will plan on instructing on HEP and issuing in next visits. No pool access yet although she is able to identify the benefits of aquatic intervention as a long term pain management tool.     From Initial evaluation:  Patient is a 32 y.o. f who was seen today for physical therapy evaluation and treatment for Fibromyalgia . She presents today with her mother. Pt has multiple co-morbidities which are chronic and pain inducing for which she receiving care for from [facility]. She reports chronic pain back to when she was a child but just recently dx with fibromyalgia.  Main complaint today is overall pain which she is wanting to better manage as well as gain strength to have improved toleration to all activity.  Her pain sensitivity is moderate and constant. She reports limitations with walking and standing as well as sitting in one position too long. Testing demonstrates hypermobility throughout, marked in knees, elbows and spine. She  has strength deficits L>R.  OBJECTIVE IMPAIRMENTS: decreased endurance, decreased mobility, difficulty walking, decreased strength, obesity, and hypermobility .   ACTIVITY LIMITATIONS: carrying, lifting, bending, sitting, standing, squatting, and stairs  PARTICIPATION LIMITATIONS: community activity, occupation, and yard work  PERSONAL FACTORS: Age, Fitness, Past/current experiences, Time since  onset of injury/illness/exacerbation, and 3+ comorbidities: see PMHx  are also affecting patient's functional outcome.   REHAB POTENTIAL: Good  CLINICAL DECISION MAKING: Evolving/moderate complexity  EVALUATION COMPLEXITY: Moderate   GOALS: Goals reviewed with patient? Yes  SHORT TERM GOALS: Target date: 06/17/23 Pt will tolerate full aquatic sessions consistently without increase in pain and with improving function to demonstrate good toleration and effectiveness of intervention.  Baseline: Goal status: MET - 06/16/23  2.  Pt will understand the benefits of aquatic therapy/intervention on long term pain management of chronic conditions and able to find pool access  Baseline:  Goal status:IN PROGRESS 07/07/23  3.  Pt will tolerate walking to and from setting and engaging in aquatic therapy session without excessive fatigue or increase in pain to demonstrate improved toleration to activity.  Baseline:  Goal status:MET - 06/18/23    LONG TERM GOALS: Target date: 07/24/23  Pt will improve by at least 9 points on LEFS to demonstrate improved function Baseline:34/80  Goal status: INITIAL  2.  Pt will be indep with final HEP's (land and aquatic as appropriate) for continued management of condition Baseline:  Goal status: INITIAL  3.  Pt will improve strength of LLE to be with in 5 lb of contralateral side to demonstrate improved overall physical function Baseline: see chart  Goal status:MET -07/14/23  4.  Pt will improve on 5 X STS test to <or= 16s to demonstrate improving functional lower extremity strength, transitional movements, and balance Baseline: 24.80 Goal status: MET - 07/14/23  5.  Pt will report overall reduction in pain to by at least 2 NPRS for improved toleration to activity/quality of life and to demonstrate improved management of pain. Baseline: see chart Goal status: INITIAL   PLAN:  PT FREQUENCY: 1-2x/week  PT DURATION: 8 weeks 12 visits  PLANNED  INTERVENTIONS: 97164- PT Re-evaluation, 97110-Therapeutic exercises, 97530- Therapeutic activity, 97112- Neuromuscular re-education, 97535- Self Care, 40981- Manual therapy, [phone]- Gait training, [phone]- Orthotic Fit/training, [phone]- Aquatic Therapy, [phone]- Ionotophoresis 4mg /ml Dexamethasone, Patient/Family education, Balance training, Stair training, Taping, Dry Needling, Joint mobilization, DME instructions, Cryotherapy, and Moist heat  PLAN FOR NEXT SESSION: aquatic: pain management; strengthening general; aerobic capacity training,posture and balance; HEP Land: HEP for core and LE strengthening, posture retraining.explore possible machines to use if she gains gym membership; Be mindful of hypermobility 1-2 visits   Mayer Camel, PTA 07/14/23 2:08 PM [facility] [address] [place], [place], [address] Phone: [phone]   Fax:  [phone]     For all possible CPT codes, reference the Planned Interventions line above.     Check all conditions that are expected to impact treatment: {Conditions expected to impact treatment:Musculoskeletal disorders and Neurological condition and/or seizures   If treatment provided at initial evaluation, no treatment charged due to lack of authorization.

## 2023-07-16 ENCOUNTER — Ambulatory Visit (HOSPITAL_BASED_OUTPATIENT_CLINIC_OR_DEPARTMENT_OTHER): Payer: Medicaid Other | Admitting: Physical Therapy

## 2023-07-17 ENCOUNTER — Encounter (HOSPITAL_BASED_OUTPATIENT_CLINIC_OR_DEPARTMENT_OTHER): Payer: Self-pay | Admitting: Physical Therapy

## 2023-07-17 ENCOUNTER — Ambulatory Visit (HOSPITAL_BASED_OUTPATIENT_CLINIC_OR_DEPARTMENT_OTHER): Payer: Medicaid Other | Admitting: Physical Therapy

## 2023-07-17 DIAGNOSIS — M797 Fibromyalgia: Secondary | ICD-10-CM

## 2023-07-17 DIAGNOSIS — M6281 Muscle weakness (generalized): Secondary | ICD-10-CM

## 2023-07-17 DIAGNOSIS — R2689 Other abnormalities of gait and mobility: Secondary | ICD-10-CM

## 2023-07-17 NOTE — Therapy (Signed)
OUTPATIENT PHYSICAL THERAPY LOWER EXTREMITY TREATMENT   Patient Name: Carolyn Miranda MRN: [id number] DOB:[birth year], 32 y.o., female Today's Date: 07/17/2023  END OF SESSION:  PT End of Session - 07/17/23 1030     Visit Number 10    Number of Visits 12    Date for PT Re-Evaluation 07/24/23    Authorization Time Period 10 visits from 10/30-12/29    PT Start Time 1030   pt arrived late   PT Stop Time 1100    PT Time Calculation (min) 30 min    Activity Tolerance Patient tolerated treatment well    Behavior During Therapy [facility] for tasks assessed/performed                Past Medical History:  Diagnosis Date   Allergies    Anxiety    Asthma    Depression    Migraines    Seizure (HCC)    Urine incontinence    Past Surgical History:  Procedure Laterality Date   BREAST REDUCTION SURGERY Bilateral 07/08/2017   WISDOM TOOTH EXTRACTION     Patient Active Problem List   Diagnosis Date Noted   Trochanteric bursitis, left hip 04/08/2023   Plantar fasciitis of left foot 04/08/2023   Myofascial pain 10/06/2022   Gastroesophageal reflux disease 06/04/2022   Social anxiety disorder 05/05/2022   GAD (generalized anxiety disorder) 05/05/2022   Antinuclear antibody (ANA) titer greater than 1:80 04/08/2022   Back pain 04/02/2022   Fibromyalgia 04/02/2022   Pelvic pain 04/02/2022   Bilateral hand pain 04/02/2022   Seizures (HCC) 11/02/2019   Encounter for monitoring anticonvulsant therapy 11/01/2019   Allergic rhinitis 03/26/2018   Neck pain 12/27/2015   Encounter for birth control 08/24/2015   Asthma 04/20/2015   Adjustment disorder with mixed anxiety and depressed mood 01/16/2015   Obesity (BMI 30-39.9) 01/16/2015    PCP: Hetty Blend NP-C  REFERRING PROVIDER: Fuller Plan, MD Fanny Dance, MD   REFERRING DIAG: Fibromyalgia   THERAPY DIAG:  Fibromyalgia  Muscle weakness (generalized)  Other abnormalities of gait and mobility  Rationale for  Evaluation and Treatment: Rehabilitation  ONSET DATE: 1 year dx  SUBJECTIVE:   SUBJECTIVE STATEMENT: Felt a difference when walking, left side in particular. The walk to the pool used to feel so long but now it's not nearly as bad.    POOL ACCESS: plans to apply for scholarship for [employer] membership  PERTINENT HISTORY: -Diagnosed with fibromyalgia December 2023 LE;LB;L shoulder, pelvic floor -Celebrex which was helping left hip trochanteric bursitis and plantar fasciitis of her feet  PAIN:  Are you having pain? Yes: NPRS scale: 3/10  Pain location:  lower back, shoulder blade Pain description: aching constant Aggravating factors: standing >15; sitting extended periods time; walking >15 minutes Relieving factors: ice/heat; CBD gummies.  PRECAUTIONS: None  RED FLAGS: None   WEIGHT BEARING RESTRICTIONS: No  FALLS:  Has patient fallen in last 6 months? Yes 1 ankle rolled  LIVING ENVIRONMENT: Lives with: lives with their family Lives in: House/apartment Stairs: Yes: External: 4 steps; on right going up Has following equipment at home: None  OCCUPATION: cashier  PLOF: Independent  PATIENT GOALS: help hypermobility, learn to exercises, manage my pain  NEXT MD VISIT: 06/17/23  OBJECTIVE:  Note: Objective measures were completed at Evaluation unless otherwise noted.  DIAGNOSTIC FINDINGS: not recent  PATIENT SURVEYS:  LEFS 34/80 07/14/23: LEFS  33/80  COGNITION: Overall cognitive status: Within functional limits for tasks assessed     SENSATION:  wfl   MUSCLE LENGTH:    POSTURE: rounded shoulders, posterior pelvic tilt, and flexed trunk    LUMBAR ROM:   Hypermobile  LOWER EXTREMITY ROM:  Active ROM Right eval Left eval  Hip flexion    Hip extension    Hip abduction    Hip adduction    Hip internal rotation    Hip external rotation    Knee flexion    Knee extension hyper hyper  Ankle dorsiflexion    Ankle plantarflexion    Ankle inversion     Ankle eversion     (Blank rows = not tested)  LOWER EXTREMITY MMT:  MMT Right eval Left eval Left  12/17  Hip flexion 52.5 46.5 51.4  Hip extension     Hip abduction 40.7 29.1 38.5  Hip adduction     Hip internal rotation     Hip external rotation     Knee flexion     Knee extension 44.1 41.6   Ankle dorsiflexion     Ankle plantarflexion     Ankle inversion     Ankle eversion      (Blank rows = not tested)   FUNCTIONAL TESTS:  5 times sit to stand: 24.80 Timed up and go (TUG): 11.25 4 stages:pass  07/14/23:   5x STS: 14.63s  GAIT: Distance walked: 500 ft Assistive device utilized: None Level of assistance: Complete Independence Comments: wfl   TODAY'S TREATMENT:                                                                                                                              Treatment                            12/20:  See plan   12/17 Pt seen for aquatic therapy today.  Treatment took place in water 3.5-4.75 ft in depth at the [facility] pool. Temp of water was 91.  Pt entered/exited the pool via stairs independently with bilat rail. * 5x STS, MMT * unsupported walking forward/ backward x 3 laps with reciprocal arm swing * side stepping with arm addct/ abdct with rainbow hand floats  x 4 laps   * staggered stance with boxing motions with light resistance bells 2 x 15; staggered stance with horiz abdct/ addct with light resistance bells x 10; then reciprocal arm swing x 10 * side to side pendulum holding yellow hand floats x 5 (challenge, cues for technique) * Straddling noodle and UE breast stroke:  cycling,3 laps * 3 way LE stretch with back against wall, solid noodle at ankle- 15sec x 2 reps each position, each leg  Pt requires the buoyancy and hydrostatic pressure of water for support, and to offload joints by unweighting joint load by at least 50 % in navel deep water and by at least 75-80% in chest to neck deep water.  Viscosity of  the water  is needed for resistance of strengthening. Water current perturbations provides challenge to standing balance requiring increased core activation.  07/07/23 Pt seen for aquatic therapy today.  Treatment took place in water 3.5-4.75 ft in depth at the [facility] pool. Temp of water was 91.  Pt entered/exited the pool via stairs independently with bilat rail. * unsupported walking forward/ backward  * side stepping with arm addct/ abdct with rainbow hand floats  * farmer carry with unilateral rainbow hand floats under water at side, walking forward/ backward * Straddling noodle ue support HB:  cycling, suspended hip abdct/ addct and cross country ski * TrA set with solid noodle pull down to thighs x 10 wide stance then staggered    PATIENT EDUCATION:  Education details: aquatic therapy progressions/ modifications Person educated: Patient Education method: Explanation Education comprehension: verbalized understanding  HOME EXERCISE PROGRAM: Aquatic This aquatic home exercise program from [employer] utilizes pictures from land based exercises, but has been adapted prior to lamination and issuance.   Access Code: [email or web address] URL: [email or web address] Date: 07/07/2023   * NOT ISSUED YET Prepared by: Geni Bers   ASSESSMENT:  CLINICAL IMPRESSION: Entirety of appointment today spent discussing neuropsychology in conjunction with physical activity. She is now established with a counselor who is getting education in chronic pain. Pt left to discuss income-based scholarship to [facility] as she does not feel comfortable at the [facility]. One more appt to finalize aquatic HEP and encouraged her to reach out with any further questions.     From Initial evaluation:  Patient is a 32 y.o. f who was seen today for physical therapy evaluation and treatment for Fibromyalgia . She presents today with her mother. Pt has multiple co-morbidities which are chronic and pain  inducing for which she receiving care for from [facility] specialties. She reports chronic pain back to when she was a child but just recently dx with fibromyalgia.  Main complaint today is overall pain which she is wanting to better manage as well as gain strength to have improved toleration to all activity.  Her pain sensitivity is moderate and constant. She reports limitations with walking and standing as well as sitting in one position too long. Testing demonstrates hypermobility throughout, marked in knees, elbows and spine. She has strength deficits L>R.  OBJECTIVE IMPAIRMENTS: decreased endurance, decreased mobility, difficulty walking, decreased strength, obesity, and hypermobility .   ACTIVITY LIMITATIONS: carrying, lifting, bending, sitting, standing, squatting, and stairs  PARTICIPATION LIMITATIONS: community activity, occupation, and yard work  PERSONAL FACTORS: Age, Fitness, Past/current experiences, Time since onset of injury/illness/exacerbation, and 3+ comorbidities: see PMHx  are also affecting patient's functional outcome.   REHAB POTENTIAL: Good  CLINICAL DECISION MAKING: Evolving/moderate complexity  EVALUATION COMPLEXITY: Moderate   GOALS: Goals reviewed with patient? Yes  SHORT TERM GOALS: Target date: 06/17/23 Pt will tolerate full aquatic sessions consistently without increase in pain and with improving function to demonstrate good toleration and effectiveness of intervention.  Baseline: Goal status: MET - 06/16/23  2.  Pt will understand the benefits of aquatic therapy/intervention on long term pain management of chronic conditions and able to find pool access  Baseline:  Goal status:IN PROGRESS 07/07/23  3.  Pt will tolerate walking to and from setting and engaging in aquatic therapy session without excessive fatigue or increase in pain to demonstrate improved toleration to activity.  Baseline:  Goal status:MET - 06/18/23    LONG TERM GOALS: Target date:  07/24/23  Pt will improve by at least 9 points  on LEFS to demonstrate improved function Baseline:34/80  Goal status: INITIAL  2.  Pt will be indep with final HEP's (land and aquatic as appropriate) for continued management of condition Baseline:  Goal status: INITIAL  3.  Pt will improve strength of LLE to be with in 5 lb of contralateral side to demonstrate improved overall physical function Baseline: see chart  Goal status:MET -07/14/23  4.  Pt will improve on 5 X STS test to <or= 16s to demonstrate improving functional lower extremity strength, transitional movements, and balance Baseline: 24.80 Goal status: MET - 07/14/23  5.  Pt will report overall reduction in pain to by at least 2 NPRS for improved toleration to activity/quality of life and to demonstrate improved management of pain. Baseline: see chart Goal status: INITIAL   PLAN:  PT FREQUENCY: 1-2x/week  PT DURATION: 8 weeks 12 visits  PLANNED INTERVENTIONS: 97164- PT Re-evaluation, 97110-Therapeutic exercises, 97530- Therapeutic activity, 97112- Neuromuscular re-education, 97535- Self Care, 57846- Manual therapy, [phone]- Gait training, [phone]- Orthotic Fit/training, [phone]- Aquatic Therapy, [phone]- Ionotophoresis 4mg /ml Dexamethasone, Patient/Family education, Balance training, Stair training, Taping, Dry Needling, Joint mobilization, DME instructions, Cryotherapy, and Moist heat  PLAN FOR NEXT SESSION: aquatic: pain management; strengthening general; aerobic capacity training,posture and balance; HEP Land: HEP for core and LE strengthening, posture retraining.explore possible machines to use if she gains gym membership; Be mindful of hypermobility 1-2 visits   Floetta Brickey C. Stephan Nelis PT, DPT 07/17/23 12:45 PM  [facility] [address] [place], [place], [address] Phone: [phone]   Fax:  [phone]     For all possible CPT codes, reference the Planned  Interventions line above.     Check all conditions that are expected to impact treatment: {Conditions expected to impact treatment:Musculoskeletal disorders and Neurological condition and/or seizures   If treatment provided at initial evaluation, no treatment charged due to lack of authorization.

## 2023-07-20 ENCOUNTER — Ambulatory Visit (HOSPITAL_BASED_OUTPATIENT_CLINIC_OR_DEPARTMENT_OTHER): Payer: Medicaid Other | Admitting: Physical Therapy

## 2023-07-20 ENCOUNTER — Encounter (HOSPITAL_BASED_OUTPATIENT_CLINIC_OR_DEPARTMENT_OTHER): Payer: Self-pay | Admitting: Physical Therapy

## 2023-07-20 DIAGNOSIS — M797 Fibromyalgia: Secondary | ICD-10-CM | POA: Diagnosis not present

## 2023-07-20 DIAGNOSIS — R2689 Other abnormalities of gait and mobility: Secondary | ICD-10-CM

## 2023-07-20 DIAGNOSIS — M6281 Muscle weakness (generalized): Secondary | ICD-10-CM

## 2023-07-20 NOTE — Therapy (Signed)
OUTPATIENT PHYSICAL THERAPY LOWER EXTREMITY TREATMENT  PHYSICAL THERAPY DISCHARGE SUMMARY  Visits from Start of Care: 11  Current functional level related to goals / functional outcomes: Pt is safe and indep with all ADL's and functional mobility   Remaining deficits: Chronic pain   Education / Equipment: Management of condition/ HEP's   Patient agrees to discharge. Patient goals were met. Patient is being discharged due to meeting the stated rehab goals.  Patient Name: Carolyn Miranda MRN: [id number] DOB:[birth year], 32 y.o., female Today's Date: 07/20/2023  END OF SESSION:  PT End of Session - 07/20/23 1209     Visit Number 11    Number of Visits 12    Date for PT Re-Evaluation 07/24/23    Authorization Time Period 10 visits from 10/30-12/29    PT Start Time 0952    PT Stop Time 1030    PT Time Calculation (min) 38 min    Activity Tolerance Patient tolerated treatment well    Behavior During Therapy [facility] for tasks assessed/performed                 Past Medical History:  Diagnosis Date   Allergies    Anxiety    Asthma    Depression    Migraines    Seizure (HCC)    Urine incontinence    Past Surgical History:  Procedure Laterality Date   BREAST REDUCTION SURGERY Bilateral 07/08/2017   WISDOM TOOTH EXTRACTION     Patient Active Problem List   Diagnosis Date Noted   Trochanteric bursitis, left hip 04/08/2023   Plantar fasciitis of left foot 04/08/2023   Myofascial pain 10/06/2022   Gastroesophageal reflux disease 06/04/2022   Social anxiety disorder 05/05/2022   GAD (generalized anxiety disorder) 05/05/2022   Antinuclear antibody (ANA) titer greater than 1:80 04/08/2022   Back pain 04/02/2022   Fibromyalgia 04/02/2022   Pelvic pain 04/02/2022   Bilateral hand pain 04/02/2022   Seizures (HCC) 11/02/2019   Encounter for monitoring anticonvulsant therapy 11/01/2019   Allergic rhinitis 03/26/2018   Neck pain 12/27/2015   Encounter for birth  control 08/24/2015   Asthma 04/20/2015   Adjustment disorder with mixed anxiety and depressed mood 01/16/2015   Obesity (BMI 30-39.9) 01/16/2015    PCP: Hetty Blend NP-C  REFERRING PROVIDER: Fuller Plan, MD Fanny Dance, MD   REFERRING DIAG: Fibromyalgia   THERAPY DIAG:  Fibromyalgia  Muscle weakness (generalized)  Other abnormalities of gait and mobility  Rationale for Evaluation and Treatment: Rehabilitation  ONSET DATE: 1 year dx  SUBJECTIVE:   SUBJECTIVE STATEMENT: Pain is better   POOL ACCESS: plans to apply for scholarship for [employer] membership  PERTINENT HISTORY: -Diagnosed with fibromyalgia December 2023 LE;LB;L shoulder, pelvic floor -Celebrex which was helping left hip trochanteric bursitis and plantar fasciitis of her feet  PAIN:  Are you having pain? Yes: NPRS scale: 3/10  Pain location:  lower back, shoulder blade Pain description: aching constant Aggravating factors: standing >15; sitting extended periods time; walking >15 minutes Relieving factors: ice/heat; CBD gummies.  PRECAUTIONS: None  RED FLAGS: None   WEIGHT BEARING RESTRICTIONS: No  FALLS:  Has patient fallen in last 6 months? Yes 1 ankle rolled  LIVING ENVIRONMENT: Lives with: lives with their family Lives in: House/apartment Stairs: Yes: External: 4 steps; on right going up Has following equipment at home: None  OCCUPATION: cashier  PLOF: Independent  PATIENT GOALS: help hypermobility, learn to exercises, manage my pain  NEXT MD VISIT: 06/17/23  OBJECTIVE:  Note: Objective measures were completed at Evaluation unless otherwise noted.  DIAGNOSTIC FINDINGS: not recent  PATIENT SURVEYS:  LEFS 34/80 07/14/23: LEFS  33/80 07/20/23:Lefs 49/80  COGNITION: Overall cognitive status: Within functional limits for tasks assessed     SENSATION: wfl   MUSCLE LENGTH:    POSTURE: rounded shoulders, posterior pelvic tilt, and flexed trunk    LUMBAR ROM:    Hypermobile  LOWER EXTREMITY ROM:  Active ROM Right eval Left eval  Hip flexion    Hip extension    Hip abduction    Hip adduction    Hip internal rotation    Hip external rotation    Knee flexion    Knee extension hyper hyper  Ankle dorsiflexion    Ankle plantarflexion    Ankle inversion    Ankle eversion     (Blank rows = not tested)  LOWER EXTREMITY MMT:  MMT Right eval Left eval Left  12/17  Hip flexion 52.5 46.5 51.4  Hip extension     Hip abduction 40.7 29.1 38.5  Hip adduction     Hip internal rotation     Hip external rotation     Knee flexion     Knee extension 44.1 41.6   Ankle dorsiflexion     Ankle plantarflexion     Ankle inversion     Ankle eversion      (Blank rows = not tested)   FUNCTIONAL TESTS:  5 times sit to stand: 24.80 Timed up and go (TUG): 11.25 4 stages:pass  07/14/23:   5x STS: 14.63s  GAIT: Distance walked: 500 ft Assistive device utilized: None Level of assistance: Complete Independence Comments: wfl   TODAY'S TREATMENT:                                                                                                                              09/19/22  Exercises - Side Stepping with Hand Floats  - 1 x daily - 1-3 x weekly - Hand Buoy Carry  - 1 x daily - 1-3 x weekly - Noodle press  - 1 x daily - 1-3 x weekly - 1-2 sets - 10 reps - Drawing Bow  - 1 x daily - 1-3 x weekly - 1-2 sets - 10 reps - Standing Hip Hinge  - 1 x daily - 1-3 x weekly - 1-2 sets - 10 reps - Standing Hip Circles at [employer]  - 1 x daily - 1-3 x weekly - 1-2 sets - 10 reps - Standing Hip Flexion Extension at [employer]  - 1 x daily - 7 x weekly - 3 sets - 10 reps - Sitting Balance on Pool Noodle  - 1 x daily - 1-3 x weekly - 1-2 sets - 10 reps - Seated Straddle on Flotation Forward Breast Stroke Arms and Bicycle Legs  - 1 x daily - 1-3 x weekly - Squat  - 1 x daily - 1-3 x weekly -  3 sets - 10 reps   Treatment                             12/20:  See plan   12/17 Pt seen for aquatic therapy today.  Treatment took place in water 3.5-4.75 ft in depth at the [facility] pool. Temp of water was 91.  Pt entered/exited the pool via stairs independently with bilat rail. * 5x STS, MMT * unsupported walking forward/ backward x 3 laps with reciprocal arm swing * side stepping with arm addct/ abdct with rainbow hand floats  x 4 laps   * staggered stance with boxing motions with light resistance bells 2 x 15; staggered stance with horiz abdct/ addct with light resistance bells x 10; then reciprocal arm swing x 10 * side to side pendulum holding yellow hand floats x 5 (challenge, cues for technique) * Straddling noodle and UE breast stroke:  cycling,3 laps * 3 way LE stretch with back against wall, solid noodle at ankle- 15sec x 2 reps each position, each leg  Pt requires the buoyancy and hydrostatic pressure of water for support, and to offload joints by unweighting joint load by at least 50 % in navel deep water and by at least 75-80% in chest to neck deep water.  Viscosity of the water is needed for resistance of strengthening. Water current perturbations provides challenge to standing balance requiring increased core activation.  07/07/23 Pt seen for aquatic therapy today.  Treatment took place in water 3.5-4.75 ft in depth at the [facility] pool. Temp of water was 91.  Pt entered/exited the pool via stairs independently with bilat rail. * unsupported walking forward/ backward  * side stepping with arm addct/ abdct with rainbow hand floats  * farmer carry with unilateral rainbow hand floats under water at side, walking forward/ backward * Straddling noodle ue support HB:  cycling, suspended hip abdct/ addct and cross country ski * TrA set with solid noodle pull down to thighs x 10 wide stance then staggered    PATIENT EDUCATION:  Education details: aquatic therapy progressions/ modifications Person educated:  Patient Education method: Explanation Education comprehension: verbalized understanding  HOME EXERCISE PROGRAM: Aquatic This aquatic home exercise program from [employer] utilizes pictures from land based exercises, but has been adapted prior to lamination and issuance.   Access Code: [email or web address] URL: [email or web address] Date: 07/07/2023   ISSUED  Exercises - Side Stepping with Hand Floats  - 1 x daily - 1-3 x weekly - Hand Buoy Carry  - 1 x daily - 1-3 x weekly - Noodle press  - 1 x daily - 1-3 x weekly - 1-2 sets - 10 reps - Drawing Bow  - 1 x daily - 1-3 x weekly - 1-2 sets - 10 reps - Standing Hip Hinge  - 1 x daily - 1-3 x weekly - 1-2 sets - 10 reps - Standing Hip Circles at [employer]  - 1 x daily - 1-3 x weekly - 1-2 sets - 10 reps - Standing Hip Flexion Extension at [employer]  - 1 x daily - 7 x weekly - 3 sets - 10 reps - Sitting Balance on Pool Noodle  - 1 x daily - 1-3 x weekly - 1-2 sets - 10 reps - Seated Straddle on Flotation Forward Breast Stroke Arms and Bicycle Legs  - 1 x daily - 1-3 x weekly - Squat  - 1 x daily - 1-3  x weekly - 3 sets - 10 reps   ASSESSMENT:  CLINICAL IMPRESSION: Pt issued and instructed on final aquatic HEP.  She demonstrates safety and indep with HEP and vu of benefits of exercise in water.  She reports compliance with land based HEP and is in process of gaining access to a pool.  She has reached her max potential and is ready for dc.  All goals met     From Initial evaluation:  Patient is a 31 y.o. f who was seen today for physical therapy evaluation and treatment for Fibromyalgia . She presents today with her mother. Pt has multiple co-morbidities which are chronic and pain inducing for which she receiving care for from [facility] specialties. She reports chronic pain back to when she was a child but just recently dx with fibromyalgia.  Main complaint today is overall pain which she is wanting to better manage as well as gain  strength to have improved toleration to all activity.  Her pain sensitivity is moderate and constant. She reports limitations with walking and standing as well as sitting in one position too long. Testing demonstrates hypermobility throughout, marked in knees, elbows and spine. She has strength deficits L>R.  OBJECTIVE IMPAIRMENTS: decreased endurance, decreased mobility, difficulty walking, decreased strength, obesity, and hypermobility .   ACTIVITY LIMITATIONS: carrying, lifting, bending, sitting, standing, squatting, and stairs  PARTICIPATION LIMITATIONS: community activity, occupation, and yard work  PERSONAL FACTORS: Age, Fitness, Past/current experiences, Time since onset of injury/illness/exacerbation, and 3+ comorbidities: see PMHx  are also affecting patient's functional outcome.   REHAB POTENTIAL: Good  CLINICAL DECISION MAKING: Evolving/moderate complexity  EVALUATION COMPLEXITY: Moderate   GOALS: Goals reviewed with patient? Yes  SHORT TERM GOALS: Target date: 06/17/23 Pt will tolerate full aquatic sessions consistently without increase in pain and with improving function to demonstrate good toleration and effectiveness of intervention.  Baseline: Goal status: MET - 06/16/23  2.  Pt will understand the benefits of aquatic therapy/intervention on long term pain management of chronic conditions and able to find pool access  Baseline:  Goal status:IN PROGRESS 07/07/23/ Met 07/20/23  3.  Pt will tolerate walking to and from setting and engaging in aquatic therapy session without excessive fatigue or increase in pain to demonstrate improved toleration to activity.  Baseline:  Goal status:MET - 06/18/23    LONG TERM GOALS: Target date: 07/24/23  Pt will improve by at least 9 points on LEFS to demonstrate improved function Baseline:34/80; 49/80  Goal status: Met 07/20/23  2.  Pt will be indep with final HEP's (land and aquatic as appropriate) for continued management of  condition Baseline:  Goal status: Met 07/20/23  3.  Pt will improve strength of LLE to be with in 5 lb of contralateral side to demonstrate improved overall physical function Baseline: see chart  Goal status:MET -07/14/23  4.  Pt will improve on 5 X STS test to <or= 16s to demonstrate improving functional lower extremity strength, transitional movements, and balance Baseline: 24.80 Goal status: MET - 07/14/23  5.  Pt will report overall reduction in pain to by at least 2 NPRS for improved toleration to activity/quality of life and to demonstrate improved management of pain. Baseline: see chart Goal status: Met see chart 07/20/23   PLAN:  PT FREQUENCY: 1-2x/week  PT DURATION: 8 weeks 12 visits  PLANNED INTERVENTIONS: 97164- PT Re-evaluation, 97110-Therapeutic exercises, 97530- Therapeutic activity, 97112- Neuromuscular re-education, 97535- Self Care, 16109- Manual therapy, [id number]- Gait training, [id number]- Orthotic Fit/training, [id number]-  Aquatic Therapy, [phone]- Ionotophoresis 4mg /ml Dexamethasone, Patient/Family education, Balance training, Stair training, Taping, Dry Needling, Joint mobilization, DME instructions, Cryotherapy, and Moist heat  PLAN FOR NEXT SESSION: aquatic: pain management; strengthening general; aerobic capacity training,posture and balance; HEP Land: HEP for core and LE strengthening, posture retraining.explore possible machines to use if she gains gym membership; Be mindful of hypermobility 1-2 visits   Rushie Chestnut) Donyale Falcon MPT 07/20/23 12:23 PM [facility] [address] [place], [place], [address] Phone: [phone]   Fax:  [phone]     For all possible CPT codes, reference the Planned Interventions line above.     Check all conditions that are expected to impact treatment: {Conditions expected to impact treatment:Musculoskeletal disorders and Neurological condition and/or seizures   If treatment  provided at initial evaluation, no treatment charged due to lack of authorization.

## 2023-07-23 ENCOUNTER — Ambulatory Visit (HOSPITAL_BASED_OUTPATIENT_CLINIC_OR_DEPARTMENT_OTHER): Payer: Medicaid Other | Admitting: Physical Therapy

## 2023-07-27 ENCOUNTER — Encounter: Payer: Medicaid Other | Attending: Physical Medicine & Rehabilitation | Admitting: Physical Medicine & Rehabilitation

## 2023-07-27 DIAGNOSIS — M797 Fibromyalgia: Secondary | ICD-10-CM | POA: Insufficient documentation

## 2023-07-27 DIAGNOSIS — M722 Plantar fascial fibromatosis: Secondary | ICD-10-CM | POA: Insufficient documentation

## 2023-07-27 DIAGNOSIS — R252 Cramp and spasm: Secondary | ICD-10-CM | POA: Insufficient documentation

## 2023-07-27 DIAGNOSIS — R4589 Other symptoms and signs involving emotional state: Secondary | ICD-10-CM | POA: Insufficient documentation

## 2023-08-03 NOTE — Progress Notes (Deleted)
 BH MD/PA/NP OP Progress Note  08/03/2023 2:27 PM Carolyn Miranda  MRN:  [id number]  Visit Diagnosis:  No diagnosis found.   Assessment: Carolyn Miranda is a 33 y.o. female with a history of anxiety, a seizure disorder, and chronic pain who presented to [facility] at [facility] for initial evaluation on 05/05/22.    During initial evaluation patient reported symptoms of anxiety including constant worry, racing thoughts, ruminations on past interactions, feeling overwhelmed, and impending dread for social interactions.  Patient endorsed experiencing restlessness secondary to her anxiety.  Patient denied any history of mania, psychosis, paranoia, SI/HI, or delusions.  Patient questioned diagnosis of ADHD and reported having done well in both school growing up and work more recently.  Of note patient was diagnosed with a seizure disorder in 2021 and there is concern of a possible autoimmune condition due to her elevated ANA.  She is scheduled with a rheumatologist for this and her chronic pain in December.  Patient met criteria for diagnosis of generalized anxiety disorder and social anxiety disorder and would benefit from medication management and connection with therapy.  While diagnosis of ADHD cannot be ruled out it does seem less likely as potential symptoms are more likely to be related to her anxiety.  Carolyn Miranda presents for follow-up evaluation. Today, 08/03/23, reports   an increase in anxiety, ruminations, negative self thoughts, and panic in the interim.  These appeared to be related to increased financial stress and difficulty adapting to her new workplace.  Patient has been taking Cymbalta  60 consistently and following with the propranolol  with some affect.  She has not increased the Cymbalta  as previously instructed.  We will plan to titrate Cymbalta  at the rate we had previously discussed going forward.  We will also start Ativan  0.5 mg daily as needed for  breakthrough anxiety.  Risk and benefits of this medication were reviewed.  Supportive interventions were employed and patient has connected with a therapist in the interim.  We will follow up in a month.  Plan: - Increase Cymbalta  to 60 mg QD and 30 mg QHS for 7 days before increasing to 60 mg BID - Continue propranolol  to 10 mg TID prn for anxiety - Start Ativan  0.5 mg daily prn for anxiety second line - Continue Topamax  200 mg QD for seizures managed by her neurologist - CBC, CMP, TSH, ANA reviewed - Has an upcoming appointment with a therapist - Follow up in a month  Chief Complaint:  No chief complaint on file.  HPI: Patient presents reporting that    she is not doing great and is having a really hard time right now. Carolyn Miranda has felt really overwhelmed with everything. 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 She was hopeful that it would be an alright job even though it was part. But she has been having a really hard time getting used to the system there. Carolyn Miranda feels like ever since she started there she is not able to do anything right. This has been made a bit worse by her manager who has been nit picking at her.  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  On Black Friday she had a panic attack which she is really embarrassed about.  Patient has been experiencing a lot of thoughts that she is worthless, failure, and week for her inability to control anxiety symptoms.  Of note this  is her second panic attack in the last several months.  She has been taking the propranolol  consistently prior to work and it has good effect most of time.  Though during periods of increased stress there can be breakthrough anxiety which does lead to panic.  This excess anxiety is likely contributing to her difficulty focusing at work as well.  Patient also endorsed some financial stressors as the job currently is part-time and does not pay  terribly well.  Due to that she had to have financial support from her father however he has informed her that he can no longer do this going forward.  Patient can find herself ruminating in bed on her days off which can leave her feeling more overwhelmed.  We provided support and worked on cognitive reframing today.  Patient was encouraged to take things 1 at a time and try to stay in the moment.  This continued perseverance on everything that needs to be done in the future will result in her feeling more overwhelmed going forward.  In regards to the Cymbalta  the patient did not increase the dose as instructed.  After discussion we agreed to proceed forward with the prior plan to titrate Cymbalta  to 60 mg twice daily over the next week.  We also suggested adding a second line as needed medication for breakthrough anxiety symptoms.  Ativan  was discussed for this and risk and benefits were reviewed.  Patient has connected with a therapist and just need to submit her insurance to have an appointment set up.  Past Psychiatric History: Has been on medications in the past. Has tried Wellbutrin , Zoloft, possibly Prozac. Lamictal got a rash. Tried BuSpar  which she think helped some.  Patient had a therapist in the past which she thought was helpful.  Increase Cymbalta  to 60 mg daily on 06/04/2022.  Past Medical History:  Past Medical History:  Diagnosis Date   Allergies    Anxiety    Asthma    Depression    Migraines    Seizure (HCC)    Urine incontinence     Past Surgical History:  Procedure Laterality Date   BREAST REDUCTION SURGERY Bilateral 07/08/2017   WISDOM TOOTH EXTRACTION      Family Psychiatric History: Denies  Family History:  Family History  Problem Relation Age of Onset   Mental illness Mother    Drug abuse Mother        overmedicating rx by psych   Miscarriages / Stillbirths Mother    Depression Mother    Arthritis Mother    Diabetes Father    Arthritis Maternal Grandmother     Asthma Maternal Grandmother     Social History:  Social History   Socioeconomic History   Marital status: Single    Spouse name: Not on file   Number of children: Not on file   Years of education: Not on file   Highest education level: Not on file  Occupational History   Not on file  Tobacco Use   Smoking status: Never    Passive exposure: Current   Smokeless tobacco: Never  Vaping Use   Vaping status: Never Used  Substance and Sexual Activity   Alcohol use: Not Currently    Comment: socially   Drug use: No   Sexual activity: Not on file  Other Topics Concern   Not on file  Social History Narrative   Not on file   Social Drivers of Health   Financial Resource Strain: Not on file  Food Insecurity: Not on file  Transportation Needs: Not on file  Physical Activity: Not on file  Stress: Not on file  Social Connections: Not on file    Allergies:  Allergies  Allergen Reactions   Bupropion  Nausea And Vomiting and Other (See Comments)   Celebrex  [Celecoxib ] Other (See Comments)    Caused wheezing    Lamotrigine Rash    Rash on left side of face Rash on left side of face Rash on left side of face Rash on left side of face    Celexa  [Citalopram  Hydrobromide] Diarrhea and Nausea And Vomiting   Other     Dairy     Current Medications: Current Outpatient Medications  Medication Sig Dispense Refill   albuterol  (VENTOLIN  HFA) 108 (90 Base) MCG/ACT inhaler Inhale 1 puff into the lungs every 4 (four) hours. 18 g 3   albuterol  (VENTOLIN  HFA) 108 (90 Base) MCG/ACT inhaler Inhale 2 puffs into the lungs every 6 (six) hours as needed for wheezing or shortness of breath. 18 g 0   clindamycin  (CLINDAGEL ) 1 % gel APPLY A THIN LAYER TO THE AFFECTED AREA(S) BY TOPICAL ROUTE 2 TIMES PER DAY 30 g 3   cyclobenzaprine  (FLEXERIL ) 5 MG tablet Take 1 tablet (5 mg total) by mouth at bedtime as needed for muscle spasms. 30 tablet 1   diclofenac  Sodium (VOLTAREN  ARTHRITIS PAIN) 1 % GEL  Apply 4 g topically 4 (four) times daily. 150 g 3   DULoxetine  (CYMBALTA ) 30 MG capsule Take 1 capsule (30 mg total) by mouth daily. Take in the evening for 7 days before starting the 60 mg dose 7 capsule 0   DULoxetine  (CYMBALTA ) 60 MG capsule Take 1 capsule (60 mg total) by mouth 2 (two) times daily. Take 60 mg in the morning and 30 mg capsule for 7 days before increasing to 60 mg 2 times daily 60 capsule 2   fluticasone  (FLONASE ) 50 MCG/ACT nasal spray Place 2 sprays into both nostrils daily. 16 g 6   fluticasone -salmeterol (ADVAIR ) 100-50 MCG/ACT AEPB Inhale 1 puff into the lungs 2 (two) times daily. 60 each 1   LORazepam  (ATIVAN ) 0.5 MG tablet Take 1 tablet (0.5 mg total) by mouth daily as needed for anxiety. Second line after propranolol  30 tablet 0   pantoprazole  (PROTONIX ) 40 MG tablet Take 1 tablet (40 mg total) by mouth daily. 30 tablet 1   predniSONE  (DELTASONE ) 20 MG tablet Take 2 tablets (40 mg total) by mouth daily with breakfast. 10 tablet 0   pregabalin  (LYRICA ) 100 MG capsule Take 1 capsule (100 mg total) by mouth 2 (two) times daily. 100 capsule 3   propranolol  (INDERAL ) 10 MG tablet Take 1 tablet (10 mg total) by mouth 3 (three) times daily as needed (anxiety). 90 tablet 2   topiramate  (TOPAMAX ) 100 MG tablet Take 2 tablets (200 mg total) by mouth daily at night. 180 tablet 3   No current facility-administered medications for this visit.     Musculoskeletal: Strength & Muscle Tone: within normal limits Gait & Station: normal Patient leans: N/A  Psychiatric Specialty Exam: Review of Systems  There were no vitals taken for this visit.There is no height or weight on file to calculate BMI.  General Appearance: Fairly Groomed  Eye Contact:  Good  Speech:  Clear and Coherent and Normal Rate  Volume:  Normal  Mood:  Anxious and Depressed  Affect:  Labile, Full Range, and Tearful  Thought Process:  Coherent and Linear  Orientation:  Full (  Time, Place, and Person)  Thought  Content: Logical   Suicidal Thoughts:  No  Homicidal Thoughts:  No  Memory:  Immediate;   Good  Judgement:  Fair  Insight:  Fair  Psychomotor Activity:  Normal  Concentration:  Concentration: Good  Recall:  Good  Fund of Knowledge: Good  Language: Good  Akathisia:  NA    AIMS (if indicated): not done  Assets:  Communication Skills Desire for Improvement Housing Social Support  ADL's:  Intact  Cognition: WNL  Sleep:  Fair   Metabolic Disorder Labs: Lab Results  Component Value Date   HGBA1C 4.7 01/16/2015   No results found for: PROLACTIN Lab Results  Component Value Date   CHOL 204 (H) 01/16/2015   TRIG 358.0 (H) 01/16/2015   HDL 47.80 01/16/2015   CHOLHDL 4 01/16/2015   VLDL 71.6 (H) 01/16/2015   Lab Results  Component Value Date   TSH 2.39 04/02/2022   TSH 4.54 (H) 01/16/2015    Therapeutic Level Labs: No results found for: LITHIUM No results found for: VALPROATE No results found for: CBMZ   Screenings: PHQ2-9    Flowsheet Row Office Visit from 05/26/2023 in [facility] - A Dept Of [facility] Office Visit from 05/06/2023 in [facility] [place] Office Visit from 03/24/2023 in [facility] - A Dept Of [facility] Office Visit from 12[facility]19/2022 in [facility] - A Dept Of [facility] Office Visit from 11/21/2022 in [facility]  PHQ-2 Total Score 4 3 4 3  0  PHQ-9 Total Score -- 14 -- 16 --       Collaboration of Care: Collaboration of Care: Medication Management AEB medication prescription and Other provider involved in patient's care AEB physical therapy chart review  Patient/Guardian was advised Release of Information must be obtained prior to any record release in order to collaborate their care with an outside provider. Patient/Guardian was advised if they have not already done so  to contact the registration department to sign all necessary forms in order for us  to release information regarding their care.   Consent: Patient/Guardian gives verbal consent for treatment and assignment of benefits for services provided during this visit. Patient/Guardian expressed understanding and agreed to proceed.   40 minutes were spent in chart review, interview, psycho education, counseling, medical decision making, coordination of care and long-term prognosis.  Patient was given opportunity to ask question and all concerns and questions were addressed and answers. Excluding separately billable services.  Arvella CHRISTELLA Finder, MD    Arvella CHRISTELLA Finder, MD 08/03/2023, 2:27 PM

## 2023-08-04 ENCOUNTER — Ambulatory Visit (HOSPITAL_COMMUNITY): Payer: Medicaid Other | Admitting: Psychiatry

## 2023-08-28 ENCOUNTER — Other Ambulatory Visit (HOSPITAL_COMMUNITY): Payer: Self-pay

## 2023-09-16 NOTE — Progress Notes (Unsigned)
BH MD/PA/NP OP Progress Note  09/17/2023 10:56 AM Carolyn Miranda  MRN:  [id number]  Visit Diagnosis:    ICD-10-CM   1. Social anxiety disorder  F40.10 LORazepam (ATIVAN) 0.5 MG tablet    DULoxetine (CYMBALTA) 60 MG capsule    DULoxetine (CYMBALTA) 30 MG capsule    2. GAD (generalized anxiety disorder)  F41.1 LORazepam (ATIVAN) 0.5 MG tablet    DULoxetine (CYMBALTA) 60 MG capsule    DULoxetine (CYMBALTA) 30 MG capsule    3. Fibromyalgia  M79.7 LORazepam (ATIVAN) 0.5 MG tablet    DULoxetine (CYMBALTA) 60 MG capsule    DULoxetine (CYMBALTA) 30 MG capsule      Assessment: Carolyn Miranda is a 33 y.o. female with a history of anxiety, a seizure disorder, and chronic pain who presented to [facility] at [facility] for initial evaluation on 05/05/22.    During initial evaluation patient reported symptoms of anxiety including constant worry, racing thoughts, ruminations on past interactions, feeling overwhelmed, and impending dread for social interactions.  Patient endorsed experiencing restlessness secondary to her anxiety.  Patient denied any history of mania, psychosis, paranoia, SI/HI, or delusions.  Patient questioned diagnosis of ADHD and reported having done well in both school growing up and work more recently.  Of note patient was diagnosed with a seizure disorder in 2021 and there is concern of a possible autoimmune condition due to her elevated ANA.  She is scheduled with a rheumatologist for this and her chronic pain in December.  Patient met criteria for diagnosis of generalized anxiety disorder and social anxiety disorder and would benefit from medication management and connection with therapy.  While diagnosis of ADHD cannot be ruled out it does seem less likely as potential symptoms are more likely to be related to her anxiety.  Carolyn Miranda presents for follow-up evaluation. Today, 09/17/23, patient reports worsening depression, anxiety, ruminations,  negative self thoughts, and somatic concerns in the interim.  There has been ongoing psychosocial stressors.  Of note patient had also discontinued Cymbalta in the interim in part due to therapist recommendation.  Education was provided on this as patient should reach out to provider before making medication changes.  Furthermore therapist diagnoses of bipolar 2 disorder does not appear to be accurate with patient's current symptoms.  She has had no episodes of hypomania/mania.  Instead of borderline personality disorder would likely be a better fit.  That said further evaluation at next visit would be needed to confirm this.  We will restart Cymbalta today reviewed the risk and benefits.  Patient has also been using the propranolol and Ativan with good effect.  There is some increased sedation from the Ativan though she finds it tolerable.  Patient notes using around 24 tabs over a 2-1/80-month period.  Psychotherapeutic interventions were used during today's session. From 9:05 AM to 9:35 AM we used empathic listening techniques and provided support. Used supportive interviewing techniques to validate patients feelings. Worked on cognitive re framing techniques and focusing on behavioral activation.  Improvement was evidenced by patient's participation.   Plan: - Start Cymbalta 30 mg daily and increase to 60 mg daily after 7 days - Can consider adding Latuda as adjunct therapy in the future - Continue propranolol to 10 mg TID prn for anxiety - Continue Ativan 0.5 mg daily prn for anxiety second line - Continue Topamax 200 mg QD for seizures managed by her neurologist - CBC, CMP, TSH, ANA reviewed - Continue therapy with Dellie Burns  -  Follow up in a month  Chief Complaint:  Chief Complaint  Patient presents with   Establish Care   HPI: Carolyn Miranda presents that she is "hanging in there". A lot has been going on the interim. She recently restarted with a counselor who dx her with bipolar 2  disorder. She reports the dx was made by a test with about 10 questions. We discussed this dx and reviewed her symptoms. On screening for hypomania or mania patient denied any congruent symptoms. We also reviewed the lack of manic progression despite being on unopposed antidepressant medication.  Briefly discussed borderline personality disorder and some of the associated symptoms.  Patient felt like some of these fit and we will go further into criteria for this diagnosis at her next visit.  Upon review of patient's symptoms she reports that she is still dealing with primarily depression and anxiety symptoms.  The previously endorsed some mood swings that had been associated with bipolar disorder or closer to the irritability and euthymia.  That said the depression was the most significant factor of current mood.  Patient reports feeling overwhelmed all the time and has the sense that things are never going well.  Somatically the pain symptoms have gotten a bit worse and she is now concerned about potential food allergy contributing to things.  Psychosocially patient is dealing with financial stressors and has not gotten a full-time job despite applying to many locations and having promising interviews.  This lack of progress has been frustrating and she has returned to work part-time at [employer] which has positives and negatives.  Patient is trying to put a positive spin on things by saying that the lack of job opportunities is the universe making sure she does not end up someplace negative.  Patient's therapist had mentioned the possibility of trying to find a remote job.  While there are definitely some positives about this was mentioned that increased isolation would likely have a negative impact on her anxiety and depression symptoms.  Of note patient had discontinued her Cymbalta around a month ago.  There has been a correlation to when she discontinued this and increasing depression and somatic  symptoms.  We reviewed that she should discuss with her provider before making medication changes particularly as the recommendation to stop Cymbalta had been made by her therapist.  She has still taken the propranolol as needed with benefit and use the Ativan for severe anxiety.  Carolyn Miranda believes she used around 24 tabs of Ativan over the past 2-1/2 months.  She does find that it works well when she uses it other than making her a bit sedated.  Discussed treatment plans going forward and ultimately decided to restart on Cymbalta.  Once stabilized at a 60 mg dose we can discuss potential adjunct therapies though will hold off on making too many changes at once.  We also did discuss therapy options the patient continues to feel comfortable with her current provider than she should continue.  However if it does not feel like a good fit perhaps other referral might be appropriate.     Past Psychiatric History: Has been on medications in the past. Has tried Wellbutrin, Zoloft, possibly Prozac. Lamictal got a rash. Tried BuSpar which she think helped some.  Patient had a therapist in the past which she thought was helpful.  Connected with Gabrielle Dare  Past Medical History:  Past Medical History:  Diagnosis Date   Allergies    Anxiety    Asthma  Depression    Migraines    Seizure (HCC)    Urine incontinence     Past Surgical History:  Procedure Laterality Date   BREAST REDUCTION SURGERY Bilateral 07/08/2017   WISDOM TOOTH EXTRACTION      Family History:  Family History  Problem Relation Age of Onset   Mental illness Mother    Drug abuse Mother        overmedicating rx by psych   Miscarriages / [place] Mother    Depression Mother    Arthritis Mother    Diabetes Father    Arthritis Maternal Grandmother    Asthma Maternal Grandmother     Social History:  Social History   Socioeconomic History   Marital status: Single    Spouse name: Not on file   Number of children: Not  on file   Years of education: Not on file   Highest education level: Not on file  Occupational History   Not on file  Tobacco Use   Smoking status: Never    Passive exposure: Current   Smokeless tobacco: Never  Vaping Use   Vaping status: Never Used  Substance and Sexual Activity   Alcohol use: Not Currently    Comment: socially   Drug use: No   Sexual activity: Not on file  Other Topics Concern   Not on file  Social History Narrative   Not on file   Social Drivers of Health   Financial Resource Strain: Not on file  Food Insecurity: Not on file  Transportation Needs: Not on file  Physical Activity: Not on file  Stress: Not on file  Social Connections: Not on file    Allergies:  Allergies  Allergen Reactions   Bupropion Nausea And Vomiting and Other (See Comments)   Celebrex [Celecoxib] Other (See Comments)    Caused wheezing    Lamotrigine Rash    Rash on left side of face Rash on left side of face Rash on left side of face Rash on left side of face    Celexa [Citalopram Hydrobromide] Diarrhea and Nausea And Vomiting   Other     Dairy     Current Medications: Current Outpatient Medications  Medication Sig Dispense Refill   albuterol (VENTOLIN HFA) 108 (90 Base) MCG/ACT inhaler Inhale 1 puff into the lungs every 4 (four) hours. 18 g 3   albuterol (VENTOLIN HFA) 108 (90 Base) MCG/ACT inhaler Inhale 2 puffs into the lungs every 6 (six) hours as needed for wheezing or shortness of breath. 18 g 0   clindamycin (CLINDAGEL) 1 % gel APPLY A THIN LAYER TO THE AFFECTED AREA(S) BY TOPICAL ROUTE 2 TIMES PER DAY 30 g 3   cyclobenzaprine (FLEXERIL) 5 MG tablet Take 1 tablet (5 mg total) by mouth at bedtime as needed for muscle spasms. 30 tablet 1   diclofenac Sodium (VOLTAREN ARTHRITIS PAIN) 1 % GEL Apply 4 g topically 4 (four) times daily. 150 g 3   DULoxetine (CYMBALTA) 30 MG capsule Take 1 capsule (30 mg total) by mouth daily. Take in the evening for 7 days before  starting the 60 mg dose 7 capsule 0   DULoxetine (CYMBALTA) 60 MG capsule Take 1 capsule (60 mg total) by mouth daily. Take 60 mg in the evening 30 capsule 2   fluticasone (FLONASE) 50 MCG/ACT nasal spray Place 2 sprays into both nostrils daily. 16 g 6   fluticasone-salmeterol (ADVAIR) 100-50 MCG/ACT AEPB Inhale 1 puff into the lungs 2 (two) times daily. 60  each 1   LORazepam (ATIVAN) 0.5 MG tablet Take 1 tablet (0.5 mg total) by mouth daily as needed for anxiety. Second line after propranolol 30 tablet 0   pantoprazole (PROTONIX) 40 MG tablet Take 1 tablet (40 mg total) by mouth daily. 30 tablet 1   pregabalin (LYRICA) 100 MG capsule Take 1 capsule (100 mg total) by mouth 2 (two) times daily. 100 capsule 3   propranolol (INDERAL) 10 MG tablet Take 1 tablet (10 mg total) by mouth 3 (three) times daily as needed (anxiety). 90 tablet 2   topiramate (TOPAMAX) 100 MG tablet Take 2 tablets (200 mg total) by mouth daily at night. 180 tablet 3   No current facility-administered medications for this visit.     Psychiatric Specialty Exam: Review of Systems  There were no vitals taken for this visit.There is no height or weight on file to calculate BMI.  General Appearance: Fairly Groomed  Eye Contact:  Good  Speech:  Clear and Coherent and Normal Rate  Volume:  Normal  Mood:  Depressed  Affect:  Congruent and Tearful  Thought Process:  Coherent  Orientation:  Full (Time, Place, and Person)  Thought Content: Logical   Suicidal Thoughts:  No  Homicidal Thoughts:  No  Memory:  Immediate;   Fair  Judgement:  Fair  Insight:  Fair  Psychomotor Activity:  Normal  Concentration:  Concentration: Fair  Recall:  Fair  Fund of Knowledge: Fair  Language: Good  Akathisia:  No    AIMS (if indicated): not done  Assets:  Communication Skills Desire for Improvement Financial Resources/Insurance Housing Social Support  ADL's:  Intact  Cognition: WNL  Sleep:  Fair   Metabolic Disorder Labs: Lab  Results  Component Value Date   HGBA1C 4.7 01/16/2015   No results found for: "PROLACTIN" Lab Results  Component Value Date   CHOL 204 (H) 01/16/2015   TRIG 358.0 (H) 01/16/2015   HDL 47.80 01/16/2015   CHOLHDL 4 01/16/2015   VLDL 71.6 (H) 01/16/2015   Lab Results  Component Value Date   TSH 2.39 04/02/2022   TSH 4.54 (H) 01/16/2015    Therapeutic Level Labs: No results found for: "LITHIUM" No results found for: "VALPROATE" No results found for: "CBMZ"   Screenings: PHQ2-9    Flowsheet Row Office Visit from 05/26/2023 in [facility] Office Visit from 05/06/2023 in [facility] [place] Office Visit from 03/24/2023 in [facility] Office Visit from 12/19/2022 in [facility] Office Visit from 11/21/2022 in [facility]  PHQ-2 Total Score 4 3 4 3  0  PHQ-9 Total Score -- 14 -- 16 --       Collaboration of Care: Collaboration of Care: Medication Management AEB medication management and Other provider involved in patient's care AEB PT chart review  Patient/Guardian was advised Release of Information must be obtained prior to any record release in order to collaborate their care with an outside provider. Patient/Guardian was advised if they have not already done so to contact the registration department to sign all necessary forms in order for [place] to release information regarding their care.   Consent: Patient/Guardian gives verbal consent for treatment and assignment of benefits for services provided during this visit. Patient/Guardian expressed understanding and agreed to proceed.    Stasia Cavalier, MD 09/17/2023, 10:56 AM   Virtual Visit via Video Note  I connected with  Carolyn Miranda 09/17/23 at  9:00 AM EST by a video enabled telemedicine application and verified that I am speaking with the correct person  using two identifiers.  Location: Patient: Home Provider: Home Office   I discussed the limitations of evaluation and management by telemedicine and the availability of in person appointments. The patient expressed understanding and agreed to proceed.   I discussed the assessment and treatment plan with the patient. The patient was provided an opportunity to ask questions and all were answered. The patient agreed with the plan and demonstrated an understanding of the instructions.   The patient was advised to call back or seek an in-person evaluation if the symptoms worsen or if the condition fails to improve as anticipated.  I provided 40 minutes of non-face-to-face time during this encounter.   Stasia Cavalier, MD

## 2023-09-17 ENCOUNTER — Other Ambulatory Visit: Payer: Self-pay

## 2023-09-17 ENCOUNTER — Encounter (HOSPITAL_COMMUNITY): Payer: Self-pay | Admitting: Psychiatry

## 2023-09-17 ENCOUNTER — Telehealth: Payer: Medicaid Other | Admitting: Family Medicine

## 2023-09-17 ENCOUNTER — Ambulatory Visit: Payer: Medicaid Other | Admitting: Family Medicine

## 2023-09-17 ENCOUNTER — Encounter (HOSPITAL_COMMUNITY): Payer: Self-pay

## 2023-09-17 ENCOUNTER — Encounter: Payer: Self-pay | Admitting: Family Medicine

## 2023-09-17 ENCOUNTER — Emergency Department (HOSPITAL_COMMUNITY)
Admission: EM | Admit: 2023-09-17 | Discharge: 2023-09-17 | Disposition: A | Discharge status: Home / Self Care | Payer: Medicaid Other | Attending: Emergency Medicine | Admitting: Emergency Medicine

## 2023-09-17 ENCOUNTER — Telehealth (HOSPITAL_COMMUNITY): Payer: Medicaid Other | Admitting: Psychiatry

## 2023-09-17 ENCOUNTER — Other Ambulatory Visit (HOSPITAL_COMMUNITY): Payer: Self-pay

## 2023-09-17 DIAGNOSIS — E860 Dehydration: Secondary | ICD-10-CM | POA: Diagnosis not present

## 2023-09-17 DIAGNOSIS — R197 Diarrhea, unspecified: Secondary | ICD-10-CM

## 2023-09-17 DIAGNOSIS — F4010 Social phobia, unspecified: Secondary | ICD-10-CM

## 2023-09-17 DIAGNOSIS — K219 Gastro-esophageal reflux disease without esophagitis: Secondary | ICD-10-CM

## 2023-09-17 DIAGNOSIS — R569 Unspecified convulsions: Secondary | ICD-10-CM | POA: Insufficient documentation

## 2023-09-17 DIAGNOSIS — M797 Fibromyalgia: Secondary | ICD-10-CM

## 2023-09-17 DIAGNOSIS — R109 Unspecified abdominal pain: Secondary | ICD-10-CM | POA: Diagnosis not present

## 2023-09-17 DIAGNOSIS — F411 Generalized anxiety disorder: Secondary | ICD-10-CM

## 2023-09-17 LAB — URINALYSIS, ROUTINE W REFLEX MICROSCOPIC
Bilirubin Urine: NEGATIVE
Glucose, UA: NEGATIVE mg/dL
Hgb urine dipstick: NEGATIVE
Ketones, ur: 5 mg/dL — AB
Leukocytes,Ua: NEGATIVE
Nitrite: NEGATIVE
Protein, ur: NEGATIVE mg/dL
Specific Gravity, Urine: 1.011 (ref 1.005–1.030)
pH: 7 (ref 5.0–8.0)

## 2023-09-17 LAB — CBC WITH DIFFERENTIAL/PLATELET
Abs Immature Granulocytes: 0.03 10*3/uL (ref 0.00–0.07)
Basophils Absolute: 0 10*3/uL (ref 0.0–0.1)
Basophils Relative: 0 %
Eosinophils Absolute: 0.2 10*3/uL (ref 0.0–0.5)
Eosinophils Relative: 2 %
HCT: 38.7 % (ref 36.0–46.0)
Hemoglobin: 12.9 g/dL (ref 12.0–15.0)
Immature Granulocytes: 0 %
Lymphocytes Relative: 17 %
Lymphs Abs: 1.6 10*3/uL (ref 0.7–4.0)
MCH: 29.1 pg (ref 26.0–34.0)
MCHC: 33.3 g/dL (ref 30.0–36.0)
MCV: 87.2 fL (ref 80.0–100.0)
Monocytes Absolute: 0.4 10*3/uL (ref 0.1–1.0)
Monocytes Relative: 4 %
Neutro Abs: 7.3 10*3/uL (ref 1.7–7.7)
Neutrophils Relative %: 77 %
Platelets: 300 10*3/uL (ref 150–400)
RBC: 4.44 MIL/uL (ref 3.87–5.11)
RDW: 13.2 % (ref 11.5–15.5)
WBC: 9.5 10*3/uL (ref 4.0–10.5)
nRBC: 0 % (ref 0.0–0.2)

## 2023-09-17 LAB — COMPREHENSIVE METABOLIC PANEL
ALT: 22 U/L (ref 0–44)
AST: 21 U/L (ref 15–41)
Albumin: 3.7 g/dL (ref 3.5–5.0)
Alkaline Phosphatase: 50 U/L (ref 38–126)
Anion gap: 10 (ref 5–15)
BUN: 7 mg/dL (ref 6–20)
CO2: 17 mmol/L — ABNORMAL LOW (ref 22–32)
Calcium: 8.8 mg/dL — ABNORMAL LOW (ref 8.9–10.3)
Chloride: 110 mmol/L (ref 98–111)
Creatinine, Ser: 0.56 mg/dL (ref 0.44–1.00)
GFR, Estimated: >60 mL/min (ref >60)
Glucose, Bld: 95 mg/dL (ref 70–99)
Potassium: 3.8 mmol/L (ref 3.5–5.1)
Sodium: 137 mmol/L (ref 135–145)
Total Bilirubin: 0.7 mg/dL (ref 0.0–1.2)
Total Protein: 6.6 g/dL (ref 6.5–8.1)

## 2023-09-17 LAB — CBG MONITORING, ED
Glucose-Capillary: 90 mg/dL (ref 70–99)
Glucose-Capillary: 86 mg/dL (ref 70–99)

## 2023-09-17 LAB — MAGNESIUM: Magnesium: 2.1 mg/dL (ref 1.7–2.4)

## 2023-09-17 LAB — HCG, SERUM, QUALITATIVE: Preg, Serum: NEGATIVE

## 2023-09-17 MED ORDER — DULOXETINE HCL 60 MG PO CPEP
60.0000 mg | ORAL_CAPSULE | Freq: Every day | ORAL | 2 refills | Status: AC
  Filled 2023-09-17: qty 30, 30d supply, fill #0
  Filled 2023-11-03: qty 30, 30d supply, fill #1
  Filled 2023-12-14: qty 30, 30d supply, fill #2

## 2023-09-17 MED ORDER — LORAZEPAM 0.5 MG PO TABS
0.5000 mg | ORAL_TABLET | Freq: Every day | ORAL | 0 refills | Status: AC | PRN
  Filled 2023-09-17: qty 30, 30d supply, fill #0

## 2023-09-17 MED ORDER — DULOXETINE HCL 30 MG PO CPEP
30.0000 mg | ORAL_CAPSULE | Freq: Every day | ORAL | 0 refills | Status: DC
  Filled 2023-09-17: qty 7, 7d supply, fill #0

## 2023-09-17 NOTE — Discharge Instructions (Signed)
Today's evaluation has been reassuring.  Please be sure to keep your appointment with your physician on Monday.  Return here for concerning changes in your condition.

## 2023-09-17 NOTE — ED Provider Triage Note (Signed)
Emergency Medicine Provider Triage Evaluation Note  Carolyn Miranda , a 33 y.o. female  was evaluated in triage.  Pt complains of seizure that occurred at 90 1 AM this morning.  Patient was at her desk when she had a clonic seizure lasted for roughly 3 minutes witnessed by her significant other.  Patient is on Topamax has been on Topamax for years and has not had a seizure in 4 years.  Patient sees [facility] neurology in [place].  Patient denies any recent infections fevers chest pain shortness of breath dysuria but does state that she has chronic abdominal pain after eating Pasta and thinks that she may have celiac's and does not see GI yet.  Review of Systems  Positive:  Negative:   Physical Exam  BP 124/67   Pulse 91   Temp 98.3 F (36.8 C)   Resp 18   Wt 99.8 kg   SpO2 98%   BMI 37.76 kg/m  Gen:   Awake, no distress   Resp:  Normal effort  MSK:   Moves extremities without difficulty  Other:    Medical Decision Making  Medically screening exam initiated at 3:41 PM.  Appropriate orders placed.  Carolyn Miranda was informed that the remainder of the evaluation will be completed by another provider, this initial triage assessment does not replace that evaluation, and the importance of remaining in the ED until their evaluation is complete.  Workup initiated, patient stable at this time   Remi Deter 09/17/23 [id number]

## 2023-09-17 NOTE — ED Triage Notes (Signed)
Pt states she had a seizure today lasting approx 3 min; hx of same; takes topamax, states has been taking as prescribed; witnessed by SO; states pt leaned back on couch, foaming at mouth, eyes rolled back, states full body shaking; last seizure was 4 years ago; pt contacted neurologist, was told to come to ED for further eval; did not  fall, did not hit head; pt c/o L calf pain; SO states pt confused after, lethargic; pt a and o x 4 at present; pt states she was nauseated after, took zofran, which resolved nausea

## 2023-09-17 NOTE — Progress Notes (Signed)
MyChart Video Visit    Virtual Visit via Video Note    Patient location: Home. Patient and provider in visit Provider location: Office 2 patient identifiers used prior to initiating visit  I discussed the limitations of evaluation and management by telemedicine and the availability of in person appointments. The patient expressed understanding and agreed to proceed.  Visit Date: 09/17/2023  Today's healthcare provider: Hetty Blend, NP-C     Subjective:    Patient ID: Carolyn Miranda, female    DOB: 1991/06/08, 33 y.o.   MRN: [id number]  Chief Complaint  Patient presents with   Abdominal Pain    Stomach pain and had a couple seizures again recently. Had one this morning and doesn't remember it, bf told her    HPI  C/o intermittent abdominal pain and diarrhea for several months.  States she cannot tolerate gluten or dairy. She also has GERD and takes medication for this prn.   States her main concern is that she had a seizure this morning. States she has not had one in 4 years. States she had a grand mal seizure this morning witness by her boyfriend. Lasted 3 minutes.  Denies injury . She is under the care of [facility]. States she is taking Topamax as usual.   States she feels tired and somewhat confused. Denies fever, chills, headache, dizziness, chest pain, palpitations, shortness of breath, N/V/D.   States she had an appointment with Dr. Mercy Riding, her psychiatrist this morning before her seizure.      Past Medical History:  Diagnosis Date   Allergies    Anxiety    Asthma    Depression    Migraines    Seizure (HCC)    Urine incontinence     Past Surgical History:  Procedure Laterality Date   BREAST REDUCTION SURGERY Bilateral 07/08/2017   WISDOM TOOTH EXTRACTION      Family History  Problem Relation Age of Onset   Mental illness Mother    Drug abuse Mother        overmedicating rx by psych   Miscarriages / [place] Mother     Depression Mother    Arthritis Mother    Diabetes Father    Arthritis Maternal Grandmother    Asthma Maternal Grandmother     Social History   Socioeconomic History   Marital status: Single    Spouse name: Not on file   Number of children: Not on file   Years of education: Not on file   Highest education level: Not on file  Occupational History   Not on file  Tobacco Use   Smoking status: Never    Passive exposure: Current   Smokeless tobacco: Never  Vaping Use   Vaping status: Never Used  Substance and Sexual Activity   Alcohol use: Not Currently    Comment: socially   Drug use: No   Sexual activity: Not on file  Other Topics Concern   Not on file  Social History Narrative   Not on file   Social Drivers of Health   Financial Resource Strain: Not on file  Food Insecurity: Not on file  Transportation Needs: Not on file  Physical Activity: Not on file  Stress: Not on file  Social Connections: Not on file  Intimate Partner Violence: Not on file    Outpatient Medications Prior to Visit  Medication Sig Dispense Refill   albuterol (VENTOLIN HFA) 108 (90 Base) MCG/ACT inhaler Inhale 1 puff into the lungs every  4 (four) hours. 18 g 3   albuterol (VENTOLIN HFA) 108 (90 Base) MCG/ACT inhaler Inhale 2 puffs into the lungs every 6 (six) hours as needed for wheezing or shortness of breath. 18 g 0   clindamycin (CLINDAGEL) 1 % gel APPLY A THIN LAYER TO THE AFFECTED AREA(S) BY TOPICAL ROUTE 2 TIMES PER DAY 30 g 3   cyclobenzaprine (FLEXERIL) 5 MG tablet Take 1 tablet (5 mg total) by mouth at bedtime as needed for muscle spasms. 30 tablet 1   diclofenac Sodium (VOLTAREN ARTHRITIS PAIN) 1 % GEL Apply 4 g topically 4 (four) times daily. 150 g 3   DULoxetine (CYMBALTA) 30 MG capsule Take 1 capsule (30 mg total) by mouth daily. Take in the evening for 7 days before starting the 60 mg dose 7 capsule 0   DULoxetine (CYMBALTA) 60 MG capsule Take 1 capsule (60 mg total) by mouth daily.  Take 60 mg in the evening 30 capsule 2   fluticasone (FLONASE) 50 MCG/ACT nasal spray Place 2 sprays into both nostrils daily. 16 g 6   fluticasone-salmeterol (ADVAIR) 100-50 MCG/ACT AEPB Inhale 1 puff into the lungs 2 (two) times daily. 60 each 1   LORazepam (ATIVAN) 0.5 MG tablet Take 1 tablet (0.5 mg total) by mouth daily as needed for anxiety. Second line after propranolol 30 tablet 0   pantoprazole (PROTONIX) 40 MG tablet Take 1 tablet (40 mg total) by mouth daily. 30 tablet 1   pregabalin (LYRICA) 100 MG capsule Take 1 capsule (100 mg total) by mouth 2 (two) times daily. 100 capsule 3   propranolol (INDERAL) 10 MG tablet Take 1 tablet (10 mg total) by mouth 3 (three) times daily as needed (anxiety). 90 tablet 2   topiramate (TOPAMAX) 100 MG tablet Take 2 tablets (200 mg total) by mouth daily at night. 180 tablet 3   predniSONE (DELTASONE) 20 MG tablet Take 2 tablets (40 mg total) by mouth daily with breakfast. 10 tablet 0   No facility-administered medications prior to visit.    Allergies  Allergen Reactions   Bupropion Nausea And Vomiting and Other (See Comments)   Celebrex [Celecoxib] Other (See Comments)    Caused wheezing    Lamotrigine Rash    Rash on left side of face Rash on left side of face Rash on left side of face Rash on left side of face    Celexa [Citalopram Hydrobromide] Diarrhea and Nausea And Vomiting   Other     Dairy     ROS     Objective:    Physical Exam  There were no vitals taken for this visit. Wt Readings from Last 3 Encounters:  05/26/23 225 lb 3.2 oz (102.2 kg)  05/13/23 223 lb (101.2 kg)  05/06/23 217 lb (98.4 kg)   Alert and oriented in no acute distress.  Normal facial and arm movements.  Respirations unlabored.  Normal speech and mood.    Assessment & Plan:   Problem List Items Addressed This Visit     GAD (generalized anxiety disorder)   Gastroesophageal reflux disease   Seizures (HCC) - Primary   Other Visit Diagnoses        Intermittent abdominal pain         Diarrhea, unspecified type          Reports having a witnessed grand mal seizure this morning lasting approximately 3 minutes.  Denies injury.  Her boyfriend did not call EMS.  Reports taking her usual medication, Topamax without disruption.  She has not followed up with her neurologist recently.  Advised her to call [facility] to tell them what happened this morning and get their advice as to whether she should go to the ED or not.  Strep precautions that if she starts to have any neurological symptoms or if she has another seizure that they will call 911. I have not seen her in the office in several months and no labs since December 2023.  Advised her to come in for an office visit in the next few weeks for an exam and blood work. She will do a home pregnancy test since she is not sure if she could be pregnant or not. She will see me a MyChart message later today and let me know what her neurologist recommended or if she is going to the ED.  I have discontinued Marcia Brash. Groll's predniSONE. I am also having her maintain her fluticasone, albuterol, clindamycin, topiramate, pantoprazole, diclofenac Sodium, pregabalin, cyclobenzaprine, albuterol, fluticasone-salmeterol, propranolol, LORazepam, DULoxetine, and DULoxetine.  No orders of the defined types were placed in this encounter.   I discussed the assessment and treatment plan with the patient. The patient was provided an opportunity to ask questions and all were answered. The patient agreed with the plan and demonstrated an understanding of the instructions.   The patient was advised to call back or seek an in-person evaluation if the symptoms worsen or if the condition fails to improve as anticipated.    Hetty Blend, NP-C [facility] [place] [phone] (phone) [phone] (fax)  [facility]

## 2023-09-17 NOTE — ED Provider Notes (Signed)
[place] EMERGENCY DEPARTMENT AT [facility] Provider Note   CSN: [id number] Arrival date & time: 09/17/23  1510     History  No chief complaint on file.   Carolyn Miranda is a 33 y.o. female.  HPI -year-old female with a history of seizures presents after witnessed seizure. She is accompanied by her female companion who witnessed the event. She states that she has been taking her medication as directed, Topamax, has not had a seizure in several years.  She does note that she is under substantial new stress.  She notes that after the seizure today which was typical for her, she had a postictal phase that lasted about 1 hour which is not unusual for her.  She spoke with her primary care physician, was sent here for evaluation.  She has outpatient neurology follow-up scheduled for 5 days from now. No current pain, focal weakness.    Home Medications Prior to Admission medications   Medication Sig Start Date End Date Taking? Authorizing Provider  albuterol (VENTOLIN HFA) 108 (90 Base) MCG/ACT inhaler Inhale 1 puff into the lungs every 4 (four) hours. 01/11/21     albuterol (VENTOLIN HFA) 108 (90 Base) MCG/ACT inhaler Inhale 2 puffs into the lungs every 6 (six) hours as needed for wheezing or shortness of breath. 05/01/23   Henson, Vickie L, NP-C  clindamycin (CLINDAGEL) 1 % gel APPLY A THIN LAYER TO THE AFFECTED AREA(S) BY TOPICAL ROUTE 2 TIMES PER DAY 02/26/21     cyclobenzaprine (FLEXERIL) 5 MG tablet Take 1 tablet (5 mg total) by mouth at bedtime as needed for muscle spasms. 04/08/23   Rice, Jamesetta Orleans, MD  diclofenac Sodium (VOLTAREN ARTHRITIS PAIN) 1 % GEL Apply 4 g topically 4 (four) times daily. 12/19/22   Fanny Dance, MD  DULoxetine (CYMBALTA) 30 MG capsule Take 1 capsule (30 mg total) by mouth daily. Take in the evening for 7 days before starting the 60 mg dose 09/17/23   Stasia Cavalier, MD  DULoxetine (CYMBALTA) 60 MG capsule Take 1 capsule (60 mg total) by mouth  daily. Take 60 mg in the evening 09/17/23   Stasia Cavalier, MD  fluticasone [facility]) 50 MCG/ACT nasal spray Place 2 sprays into both nostrils daily. 03/25/18   Myrlene Broker, MD  fluticasone-salmeterol (ADVAIR) 100-50 MCG/ACT AEPB Inhale 1 puff into the lungs 2 (two) times daily. 05/06/23   Henson, Vickie L, NP-C  LORazepam (ATIVAN) 0.5 MG tablet Take 1 tablet (0.5 mg total) by mouth daily as needed for anxiety. Second line after propranolol 09/17/23   Stasia Cavalier, MD  pantoprazole (PROTONIX) 40 MG tablet Take 1 tablet (40 mg total) by mouth daily. 11/21/22   Henson, Vickie L, NP-C  pregabalin (LYRICA) 100 MG capsule Take 1 capsule (100 mg total) by mouth 2 (two) times daily. 03/24/23   Fanny Dance, MD  propranolol (INDERAL) 10 MG tablet Take 1 tablet (10 mg total) by mouth 3 (three) times daily as needed (anxiety). 05/28/23   Stasia Cavalier, MD  topiramate (TOPAMAX) 100 MG tablet Take 2 tablets (200 mg total) by mouth daily at night. 11/17/22         Allergies    Bupropion, Celebrex [celecoxib], Lamotrigine, Celexa [citalopram hydrobromide], and Other    Review of Systems   Review of Systems  Physical Exam Updated Vital Signs BP 120/60   Pulse 80   Temp 98.1 F (36.7 C) (Oral)   Resp 18   Wt 99.8 kg  SpO2 100%   BMI 37.76 kg/m  Physical Exam Vitals and nursing note reviewed.  Constitutional:      General: She is not in acute distress.    Appearance: She is well-developed.  HENT:     Head: Normocephalic and atraumatic.  Eyes:     Conjunctiva/sclera: Conjunctivae normal.  Cardiovascular:     Rate and Rhythm: Normal rate and regular rhythm.     Pulses: Normal pulses.  Pulmonary:     Effort: Pulmonary effort is normal. No respiratory distress.     Breath sounds: No stridor.  Abdominal:     General: There is no distension.  Skin:    General: Skin is warm and dry.  Neurological:     General: No focal deficit present.     Mental Status: She is alert and  oriented to person, place, and time.     Cranial Nerves: No cranial nerve deficit.     Motor: No weakness.     Coordination: Coordination normal.  Psychiatric:        Mood and Affect: Mood normal.     ED Results / Procedures / Treatments   Labs (all labs ordered are listed, but only abnormal results are displayed) Labs Reviewed  COMPREHENSIVE METABOLIC PANEL - Abnormal; Notable for the following components:      Result Value   CO2 17 (*)    Calcium 8.8 (*)    All other components within normal limits  URINALYSIS, ROUTINE W REFLEX MICROSCOPIC - Abnormal; Notable for the following components:   Ketones, ur 5 (*)    All other components within normal limits  CBC WITH DIFFERENTIAL/PLATELET  HCG, SERUM, QUALITATIVE  MAGNESIUM  TOPIRAMATE LEVEL  CBG MONITORING, ED  CBG MONITORING, ED    EKG None  Radiology No results found.  Procedures Procedures    Medications Ordered in ED Medications - No data to display  ED Course/ Medical Decision Making/ A&P                                 Medical Decision Making Adult female with seizure disorder presents after witnessed seizure.  No evidence for trauma, distress, suspicion for breakthrough seizure, though consideration of dehydration, electrolyte abn ormalities, infection all warrant labs, monitoring.   cardiac 70 sinus normal Pulse ox 100% room air normal  Amount and/or Complexity of Data Reviewed Independent Historian: friend Labs:  Decision-making details documented in ED Course.  Risk Prescription drug management. Decision regarding hospitalization.   Update: On repeat exam patient is awake, alert, in no distress, speaking clearly.  We discussed possibilities for her seizures, some suspicion for stress lowering her seizure threshold versus dehydration given her reassuring exam, absence of seizure activity throughout hours of monitoring here.  Given reassuring findings, patient will follow-up with her primary care  physician.        Final Clinical Impression(s) / ED Diagnoses Final diagnoses:  Seizure [facility])  Dehydration    Rx / DC Orders ED Discharge Orders     None         Gerhard Munch, MD 09/17/23 2319

## 2023-09-17 NOTE — ED Notes (Signed)
 Patient verbalizes understanding of discharge instructions. Opportunity for questioning and answers were provided. Armband removed by staff, pt discharged from ED. Pt ambulatory to ED waiting room with steady gait.

## 2023-09-18 LAB — TOPIRAMATE LEVEL: Topiramate Lvl: 3.5 ug/mL (ref 2.0–25.0)

## 2023-09-28 ENCOUNTER — Other Ambulatory Visit (HOSPITAL_COMMUNITY): Payer: Self-pay

## 2023-09-28 ENCOUNTER — Other Ambulatory Visit (INDEPENDENT_AMBULATORY_CARE_PROVIDER_SITE_OTHER): Payer: Self-pay

## 2023-09-28 DIAGNOSIS — R569 Unspecified convulsions: Secondary | ICD-10-CM

## 2023-09-30 ENCOUNTER — Other Ambulatory Visit (HOSPITAL_COMMUNITY): Payer: Self-pay

## 2023-09-30 ENCOUNTER — Other Ambulatory Visit: Payer: Self-pay | Admitting: Internal Medicine

## 2023-09-30 ENCOUNTER — Other Ambulatory Visit: Payer: Self-pay | Admitting: Family Medicine

## 2023-09-30 DIAGNOSIS — M797 Fibromyalgia: Secondary | ICD-10-CM

## 2023-09-30 DIAGNOSIS — J4520 Mild intermittent asthma, uncomplicated: Secondary | ICD-10-CM

## 2023-09-30 MED ORDER — CYCLOBENZAPRINE HCL 5 MG PO TABS
5.0000 mg | ORAL_TABLET | Freq: Every evening | ORAL | 1 refills | Status: AC | PRN
Start: 2023-09-30 — End: ?
  Filled 2023-09-30: qty 30, 30d supply, fill #0
  Filled 2023-12-14: qty 30, 30d supply, fill #1

## 2023-09-30 MED ORDER — ALBUTEROL SULFATE HFA 108 (90 BASE) MCG/ACT IN AERS
2.0000 | INHALATION_SPRAY | Freq: Four times a day (QID) | RESPIRATORY_TRACT | 0 refills | Status: DC | PRN
  Filled 2023-09-30: qty 18, 25d supply, fill #0

## 2023-09-30 NOTE — Telephone Encounter (Signed)
 Last Fill: 04/08/2023  Next Visit: Return if symptoms worsen or fail to improve, for Foot pain, hip pain.   Last Visit: 05/13/2023  Dx: Fibromyalgia   Current Dose per office note on 05/13/2023: Flexeril 5 mg as needed at night   Okay to refill Flexeril?

## 2023-10-02 ENCOUNTER — Ambulatory Visit (INDEPENDENT_AMBULATORY_CARE_PROVIDER_SITE_OTHER): Admitting: Family Medicine

## 2023-10-02 ENCOUNTER — Encounter: Payer: Self-pay | Admitting: Family Medicine

## 2023-10-02 VITALS — BP 120/72 | HR 88 | Temp 97.8°F | Ht 64.0 in | Wt 219.0 lb

## 2023-10-02 DIAGNOSIS — F411 Generalized anxiety disorder: Secondary | ICD-10-CM | POA: Diagnosis not present

## 2023-10-02 DIAGNOSIS — R569 Unspecified convulsions: Secondary | ICD-10-CM

## 2023-10-02 NOTE — Progress Notes (Signed)
 Subjective:     Patient ID: Carolyn Miranda, female    DOB: 1991-06-06, 33 y.o.   MRN: [id number]  Chief Complaint  Patient presents with   Seizures    HPI   History of Present Illness         Here requesting a referral to [facility] neurology for seizure disorder. Diagnosed in 2021. She was seizure free for 4 years until 09/17/2023.   She has been seeing [facility] until recently.   She has been stressed recently due to sick pet and financial stress.   She has cut gluten in diet which has helped GI symptoms.    Health Maintenance Due  Topic Date Due   Pneumococcal Vaccine 32-68 Years old (1 of 2 - PCV) Never done   Hepatitis C Screening  Never done   DTaP/Tdap/Td (1 - Tdap) Never done   Cervical Cancer Screening (HPV/Pap Cotest)  Never done   INFLUENZA VACCINE  Never done   COVID-19 Vaccine (4 - 2024-25 season) 03/29/2023    Past Medical History:  Diagnosis Date   Allergies    Anxiety    Asthma    Depression    Migraines    Seizure (HCC)    Urine incontinence     Past Surgical History:  Procedure Laterality Date   BREAST REDUCTION SURGERY Bilateral 07/08/2017   WISDOM TOOTH EXTRACTION      Family History  Problem Relation Age of Onset   Mental illness Mother    Drug abuse Mother        overmedicating rx by psych   Miscarriages / [place] Mother    Depression Mother    Arthritis Mother    Diabetes Father    Arthritis Maternal Grandmother    Asthma Maternal Grandmother     Social History   Socioeconomic History   Marital status: Single    Spouse name: Not on file   Number of children: Not on file   Years of education: Not on file   Highest education level: Not on file  Occupational History   Not on file  Tobacco Use   Smoking status: Never    Passive exposure: Current   Smokeless tobacco: Never  Vaping Use   Vaping status: Never Used  Substance and Sexual Activity   Alcohol use: Not Currently    Comment: socially   Drug use:  No   Sexual activity: Not on file  Other Topics Concern   Not on file  Social History Narrative   Not on file   Social Drivers of Health   Financial Resource Strain: Not on file  Food Insecurity: Not on file  Transportation Needs: Not on file  Physical Activity: Not on file  Stress: Not on file  Social Connections: Not on file  Intimate Partner Violence: Not on file    Outpatient Medications Prior to Visit  Medication Sig Dispense Refill   albuterol (VENTOLIN HFA) 108 (90 Base) MCG/ACT inhaler Inhale 1 puff into the lungs every 4 (four) hours. 18 g 3   albuterol (VENTOLIN HFA) 108 (90 Base) MCG/ACT inhaler Inhale 2 puffs into the lungs every 6 (six) hours as needed for wheezing or shortness of breath. 18 g 0   clindamycin (CLINDAGEL) 1 % gel APPLY A THIN LAYER TO THE AFFECTED AREA(S) BY TOPICAL ROUTE 2 TIMES PER DAY 30 g 3   cyclobenzaprine (FLEXERIL) 5 MG tablet Take 1 tablet (5 mg total) by mouth at bedtime as needed for muscle spasms. 30  tablet 1   diclofenac Sodium (VOLTAREN ARTHRITIS PAIN) 1 % GEL Apply 4 g topically 4 (four) times daily. 150 g 3   DULoxetine (CYMBALTA) 30 MG capsule Take 1 capsule (30 mg total) by mouth daily. Take in the evening for 7 days before starting the 60 mg dose 7 capsule 0   DULoxetine (CYMBALTA) 60 MG capsule Take 1 capsule (60 mg total) by mouth daily. Take 60 mg in the evening 30 capsule 2   fluticasone (FLONASE) 50 MCG/ACT nasal spray Place 2 sprays into both nostrils daily. 16 g 6   fluticasone-salmeterol (ADVAIR) 100-50 MCG/ACT AEPB Inhale 1 puff into the lungs 2 (two) times daily. 60 each 1   LORazepam (ATIVAN) 0.5 MG tablet Take 1 tablet (0.5 mg total) by mouth daily as needed for anxiety. Second line after propranolol 30 tablet 0   pantoprazole (PROTONIX) 40 MG tablet Take 1 tablet (40 mg total) by mouth daily. 30 tablet 1   pregabalin (LYRICA) 100 MG capsule Take 1 capsule (100 mg total) by mouth 2 (two) times daily. 100 capsule 3    propranolol (INDERAL) 10 MG tablet Take 1 tablet (10 mg total) by mouth 3 (three) times daily as needed (anxiety). 90 tablet 2   topiramate (TOPAMAX) 100 MG tablet Take 2 tablets (200 mg total) by mouth daily at night. 180 tablet 3   No facility-administered medications prior to visit.    Allergies  Allergen Reactions   Bupropion Nausea And Vomiting and Other (See Comments)   Celebrex [Celecoxib] Other (See Comments)    Caused wheezing    Lamotrigine Rash    Rash on left side of face Rash on left side of face Rash on left side of face Rash on left side of face    Celexa [Citalopram Hydrobromide] Diarrhea and Nausea And Vomiting   Other     Dairy     Review of Systems  Constitutional:  Negative for chills and fever.  Eyes:  Negative for blurred vision and double vision.  Respiratory:  Negative for shortness of breath.   Cardiovascular:  Negative for chest pain, palpitations and leg swelling.  Gastrointestinal:  Negative for abdominal pain, constipation, diarrhea, nausea and vomiting.  Genitourinary:  Negative for dysuria, frequency and urgency.  Neurological:  Negative for dizziness and seizures.  Psychiatric/Behavioral:  The patient is nervous/anxious.        Objective:    Physical Exam Constitutional:      General: She is not in acute distress.    Appearance: She is not ill-appearing.  Eyes:     Extraocular Movements: Extraocular movements intact.     Conjunctiva/sclera: Conjunctivae normal.  Cardiovascular:     Rate and Rhythm: Normal rate.  Pulmonary:     Effort: Pulmonary effort is normal.  Musculoskeletal:     Cervical back: Normal range of motion and neck supple.  Skin:    General: Skin is warm and dry.  Neurological:     General: No focal deficit present.     Mental Status: She is alert and oriented to person, place, and time.  Psychiatric:        Mood and Affect: Mood normal.        Behavior: Behavior normal.        Thought Content: Thought content  normal.      BP 120/72 (BP Location: Left Arm, Patient Position: Sitting)   Pulse 88   Temp 97.8 F (36.6 C) (Temporal)   Ht 5\' 4"  (1.626 m)  Wt 219 lb (99.3 kg)   SpO2 97%   BMI 37.59 kg/m  Wt Readings from Last 3 Encounters:  10/02/23 219 lb (99.3 kg)  09/17/23 220 lb (99.8 kg)  05/26/23 225 lb 3.2 oz (102.2 kg)       Assessment & Plan:   Problem List Items Addressed This Visit     GAD (generalized anxiety disorder)   Seizures (HCC) - Primary   Relevant Orders   Ambulatory referral to Neurology   Reviewed recent notes from ED post seizure.  She is taking Topamax as directed and is not driving.  Referral placed to [facility] per patient request.  Continue close follow up with mental health specialists.   I am having [name] maintain her fluticasone, albuterol, clindamycin, topiramate, pantoprazole, diclofenac Sodium, pregabalin, fluticasone-salmeterol, propranolol, LORazepam, DULoxetine, DULoxetine, cyclobenzaprine, and albuterol.  No orders of the defined types were placed in this encounter.

## 2023-10-02 NOTE — Patient Instructions (Signed)
 Avoid driving.   You should hear from [facility], let us know if you do not.

## 2023-10-06 ENCOUNTER — Encounter: Payer: Self-pay | Admitting: Family Medicine

## 2023-10-07 ENCOUNTER — Ambulatory Visit (HOSPITAL_COMMUNITY)
Admission: RE | Admit: 2023-10-07 | Discharge: 2023-10-07 | Disposition: A | Discharge status: Home / Self Care | Source: Ambulatory Visit | Attending: Neurology | Admitting: Neurology

## 2023-10-07 DIAGNOSIS — R569 Unspecified convulsions: Secondary | ICD-10-CM | POA: Insufficient documentation

## 2023-10-07 NOTE — Progress Notes (Signed)
 EEG complete - results pending

## 2023-10-07 NOTE — Procedures (Signed)
 Patient Name: Carolyn Miranda  MRN: [id number]  Epilepsy Attending: Charlsie Quest  Referring Physician/Provider: Charlsie Quest, MD  Date: 10/07/2023 Duration: 26.03 mins  Patient history: [age] female with seizure-like activity.  EEG to alert for seizures.  Level of alertness: Awake  AEDs during EEG study: Topamax  Technical aspects: This EEG study was done with scalp electrodes positioned according to the 10-20 International system of electrode placement. Electrical activity was reviewed with band pass filter of 1-70Hz , sensitivity of 7 uV/mm, display speed of 31mm/sec with a 60Hz  notched filter applied as appropriate. EEG data were recorded continuously and digitally stored.  Video monitoring was available and reviewed as appropriate.  Description: The posterior dominant rhythm consists of 9 Hz activity of moderate voltage (25-35 uV) seen predominantly in posterior head regions, symmetric and reactive to eye opening and eye closing. Hyperventilation did not show any EEG change.  Physiologic photic driving was not seen during photic stimulation.    IMPRESSION: This study is within normal limits. No seizures or epileptiform discharges were seen throughout the recording.  A normal interictal EEG does not exclude the diagnosis of epilepsy.  Geremiah Fussell Annabelle Harman

## 2023-10-12 NOTE — Progress Notes (Unsigned)
 BH MD/PA/NP OP Progress Note  10/15/2023 11:20 AM Carolyn Miranda  MRN:  [id number]  Visit Diagnosis:    ICD-10-CM   1. Social anxiety disorder  F40.10 DULoxetine (CYMBALTA) 30 MG capsule    2. GAD (generalized anxiety disorder)  F41.1 DULoxetine (CYMBALTA) 30 MG capsule    3. Fibromyalgia  M79.7 DULoxetine (CYMBALTA) 30 MG capsule       Assessment: Carolyn Miranda is a 33 y.o. female with a history of anxiety, a seizure disorder, and chronic pain who presented to [facility] at [facility] for initial evaluation on 05/05/22.    During initial evaluation patient reported symptoms of anxiety including constant worry, racing thoughts, ruminations on past interactions, feeling overwhelmed, and impending dread for social interactions.  Patient endorsed experiencing restlessness secondary to her anxiety.  Patient denied any history of mania, psychosis, paranoia, SI/HI, or delusions.  Patient questioned diagnosis of ADHD and reported having done well in both school growing up and work more recently.  Of note patient was diagnosed with a seizure disorder in 2021 and there is concern of a possible autoimmune condition due to her elevated ANA.  She is scheduled with a rheumatologist for this and her chronic pain in December.  Patient met criteria for diagnosis of generalized anxiety disorder and social anxiety disorder and would benefit from medication management and connection with therapy.  While diagnosis of ADHD cannot be ruled out it does seem less likely as potential symptoms are more likely to be related to her anxiety.  [name] presents for follow-up evaluation. Today, 10/15/23, patient reports some improvement in depressive symptoms following the reinitiation of Cymbalta.  She denies less hopelessness, irritability, negative thoughts, and amotivation compared to the past.  She does still have significant anxiety and ruminations that are likely further triggered by  ongoing health concerns.  Patient did have a breakthrough seizure in the interim for the first time in 4 years.  She is following up with neurology for management and recent EEG was within normal limits.  We will titrate the Cymbalta to 90 mg daily reviewed the risk and benefits. Patient has also been using the propranolol and Ativan with good effect.  She reports using the Ativan around 1-2 times a week.  Psychotherapeutic interventions were used during today's session. From 10:05 AM to 10:31 AM we used empathic listening techniques and provided support. Used supportive interviewing techniques to validate patients feelings. Worked on cognitive re framing techniques and focusing on behavioral activation.  Improvement was evidenced by patient's participation.   Plan: - Increase Cymbalta to 90 mg daily  - Can consider adding Latuda as adjunct therapy at next appointment - Continue propranolol to 10 mg TID prn for anxiety - Continue Ativan 0.5 mg daily prn for anxiety second line - Continue Topamax 100 mg BID for seizures managed by her neurologist - CBC, CMP, TSH, ANA reviewed - EEG from 10/07/23 reviewed and was WNL - Continue therapy with Dellie Burns  - Follow up in a month  Chief Complaint:  Chief Complaint  Patient presents with   Follow-up   HPI: Carolyn Miranda presents reporting that she is hanging in there. A lot going on right now. She is feeling overwhelmed with her physical health. Shortly after her appointment she had a grand mal seizure for the first time in 4 years. After this occurred she has not been able to drive for 6 months (until August). She feels isolated and worried about how she is going  to get anywhere at this point. Her mom and boyfriend have helped a lot, but she feels guilty that they have to plan around her. Provided support around the reoccurrence of her seizure, and the impact on her health.   Carolyn Miranda restarted the Cymbalta and is back on the 60 mg daily dose.  Since  restarting it she has found that she has been less depressed on the medication.  She has not feels hopeless as she had in the past and has an easier time with identifying positives.  She also has had decreased irritability and been able to identify things that she is looking forward to.  The anxiety symptoms still persist and she describes feeling overwhelmed and anxious about her work, financial situation, and ongoing health issues.  Discussed medication options and opted to titrate Cymbalta to 90 mg daily after reviewing the risk and benefits.  We also did discuss Latuda and we will plan to start this at her next visit if symptoms continue to persist.  Patient continues to take propranolol as needed and the Ativan as a second line for anxiety around 1-2 times a week.  She denies any adverse effects of either medication.  Past Psychiatric History: Has been on medications in the past. Has tried Wellbutrin, Zoloft, possibly Prozac. Lamictal got a rash. Tried BuSpar which she think helped some.  Patient had a therapist in the past which she thought was helpful.  Connected with Gabrielle Dare  Past Medical History:  Past Medical History:  Diagnosis Date   Allergies    Anxiety    Asthma    Depression    Migraines    Seizure (HCC)    Urine incontinence     Past Surgical History:  Procedure Laterality Date   BREAST REDUCTION SURGERY Bilateral 07/08/2017   WISDOM TOOTH EXTRACTION      Family History:  Family History  Problem Relation Age of Onset   Mental illness Mother    Drug abuse Mother        overmedicating rx by psych   Miscarriages / [place] Mother    Depression Mother    Arthritis Mother    Diabetes Father    Arthritis Maternal Grandmother    Asthma Maternal Grandmother     Social History:  Social History   Socioeconomic History   Marital status: Single    Spouse name: Not on file   Number of children: Not on file   Years of education: Not on file   Highest education  level: Not on file  Occupational History   Not on file  Tobacco Use   Smoking status: Never    Passive exposure: Current   Smokeless tobacco: Never  Vaping Use   Vaping status: Never Used  Substance and Sexual Activity   Alcohol use: Not Currently    Comment: socially   Drug use: No   Sexual activity: Not on file  Other Topics Concern   Not on file  Social History Narrative   Not on file   Social Drivers of Health   Financial Resource Strain: Not on file  Food Insecurity: Not on file  Transportation Needs: Not on file  Physical Activity: Not on file  Stress: Not on file  Social Connections: Not on file    Allergies:  Allergies  Allergen Reactions   Bupropion Nausea And Vomiting and Other (See Comments)   Celebrex [Celecoxib] Other (See Comments)    Caused wheezing    Lamotrigine Rash    Rash  on left side of face Rash on left side of face Rash on left side of face Rash on left side of face    Celexa [Citalopram Hydrobromide] Diarrhea and Nausea And Vomiting   Other     Dairy     Current Medications: Current Outpatient Medications  Medication Sig Dispense Refill   albuterol (VENTOLIN HFA) 108 (90 Base) MCG/ACT inhaler Inhale 1 puff into the lungs every 4 (four) hours. 18 g 3   albuterol (VENTOLIN HFA) 108 (90 Base) MCG/ACT inhaler Inhale 2 puffs into the lungs every 6 (six) hours as needed for wheezing or shortness of breath. 18 g 0   clindamycin (CLINDAGEL) 1 % gel APPLY A THIN LAYER TO THE AFFECTED AREA(S) BY TOPICAL ROUTE 2 TIMES PER DAY 30 g 3   cyclobenzaprine (FLEXERIL) 5 MG tablet Take 1 tablet (5 mg total) by mouth at bedtime as needed for muscle spasms. 30 tablet 1   diclofenac Sodium (VOLTAREN ARTHRITIS PAIN) 1 % GEL Apply 4 g topically 4 (four) times daily. 150 g 3   DULoxetine (CYMBALTA) 30 MG capsule Take 1 capsule (30 mg total) by mouth daily. Take with a 60 mg dose for a total of 90 mg daily 30 capsule 2   DULoxetine (CYMBALTA) 60 MG capsule Take 1  capsule (60 mg total) by mouth daily. Take 60 mg in the evening 30 capsule 2   fluticasone (FLONASE) 50 MCG/ACT nasal spray Place 2 sprays into both nostrils daily. 16 g 6   fluticasone-salmeterol (ADVAIR) 100-50 MCG/ACT AEPB Inhale 1 puff into the lungs 2 (two) times daily. 60 each 1   LORazepam (ATIVAN) 0.5 MG tablet Take 1 tablet (0.5 mg total) by mouth daily as needed for anxiety. Second line after propranolol 30 tablet 0   pantoprazole (PROTONIX) 40 MG tablet Take 1 tablet (40 mg total) by mouth daily. 30 tablet 1   propranolol (INDERAL) 10 MG tablet Take 1 tablet (10 mg total) by mouth 3 (three) times daily as needed (anxiety). 90 tablet 2   topiramate (TOPAMAX) 100 MG tablet Take 2 tablets (200 mg total) by mouth daily at night. 180 tablet 3   No current facility-administered medications for this visit.     Psychiatric Specialty Exam: Review of Systems  There were no vitals taken for this visit.There is no height or weight on file to calculate BMI.  General Appearance: Fairly Groomed  Eye Contact:  Good  Speech:  Clear and Coherent and Normal Rate  Volume:  Normal  Mood:  Depressed  Affect:  Congruent and Tearful  Thought Process:  Coherent  Orientation:  Full (Time, Place, and Person)  Thought Content: Logical   Suicidal Thoughts:  No  Homicidal Thoughts:  No  Memory:  Immediate;   Fair  Judgement:  Fair  Insight:  Fair  Psychomotor Activity:  Normal  Concentration:  Concentration: Fair  Recall:  Fair  Fund of Knowledge: Fair  Language: Good  Akathisia:  No    AIMS (if indicated): not done  Assets:  Communication Skills Desire for Improvement Financial Resources/Insurance Housing Social Support  ADL's:  Intact  Cognition: WNL  Sleep:  Fair   Metabolic Disorder Labs: Lab Results  Component Value Date   HGBA1C 4.7 01/16/2015   No results found for: "PROLACTIN" Lab Results  Component Value Date   CHOL 204 (H) 01/16/2015   TRIG 358.0 (H) 01/16/2015   HDL  47.80 01/16/2015   CHOLHDL 4 01/16/2015   VLDL 71.6 (H) 01/16/2015  Lab Results  Component Value Date   TSH 2.39 04/02/2022   TSH 4.54 (H) 01/16/2015    Therapeutic Level Labs: No results found for: "LITHIUM" No results found for: "VALPROATE" No results found for: "CBMZ"   Screenings: PHQ2-9    Flowsheet Row Office Visit from 05/26/2023 in [facility] Office Visit from 05/06/2023 in [facility] [place] Office Visit from 03/24/2023 in [facility] Office Visit from 12/19/2022 in [facility] Office Visit from 11/21/2022 in [facility]  PHQ-2 Total Score 4 3 4 3  0  PHQ-9 Total Score -- 14 -- 16 --      Flowsheet Row ED from 09/17/2023 in [facility] Emergency Department at [facility]  C-SSRS RISK CATEGORY No Risk       Collaboration of Care: Collaboration of Care: Medication Management AEB medication management and Other provider involved in patient's care AEB ED and PCP chart review  Patient/Guardian was advised Release of Information must be obtained prior to any record release in order to collaborate their care with an outside provider. Patient/Guardian was advised if they have not already done so to contact the registration department to sign all necessary forms in order for [place] to release information regarding their care.   Consent: Patient/Guardian gives verbal consent for treatment and assignment of benefits for services provided during this visit. Patient/Guardian expressed understanding and agreed to proceed.    Stasia Cavalier, MD 10/15/2023, 11:20 AM   Virtual Visit via Video Note  I connected with Dianelys Scinto 10/15/23 at 10:00 AM EDT by a video enabled telemedicine application and verified that I am speaking with the correct person using two identifiers.  Location: Patient:  Home Provider: Home Office   I discussed the limitations of evaluation and management by telemedicine and the availability of in person appointments. The patient expressed understanding and agreed to proceed.   I discussed the assessment and treatment plan with the patient. The patient was provided an opportunity to ask questions and all were answered. The patient agreed with the plan and demonstrated an understanding of the instructions.   The patient was advised to call back or seek an in-person evaluation if the symptoms worsen or if the condition fails to improve as anticipated.  I provided 35 minutes of non-face-to-face time during this encounter.   Stasia Cavalier, MD

## 2023-10-13 ENCOUNTER — Encounter: Payer: Self-pay | Admitting: Neurology

## 2023-10-15 ENCOUNTER — Other Ambulatory Visit (HOSPITAL_COMMUNITY): Payer: Self-pay

## 2023-10-15 ENCOUNTER — Telehealth (HOSPITAL_COMMUNITY): Payer: Medicaid Other | Admitting: Psychiatry

## 2023-10-15 ENCOUNTER — Encounter (HOSPITAL_COMMUNITY): Payer: Self-pay | Admitting: Psychiatry

## 2023-10-15 DIAGNOSIS — F4010 Social phobia, unspecified: Secondary | ICD-10-CM | POA: Diagnosis not present

## 2023-10-15 DIAGNOSIS — M797 Fibromyalgia: Secondary | ICD-10-CM | POA: Diagnosis not present

## 2023-10-15 DIAGNOSIS — F411 Generalized anxiety disorder: Secondary | ICD-10-CM | POA: Diagnosis not present

## 2023-10-15 MED ORDER — DULOXETINE HCL 30 MG PO CPEP
30.0000 mg | ORAL_CAPSULE | Freq: Every day | ORAL | 2 refills | Status: AC
  Filled 2023-10-15: qty 30, 30d supply, fill #0
  Filled 2023-11-03 – 2023-11-09 (×2): qty 30, 30d supply, fill #1
  Filled 2023-12-14: qty 30, 30d supply, fill #2

## 2023-10-16 ENCOUNTER — Other Ambulatory Visit (HOSPITAL_COMMUNITY): Payer: Self-pay

## 2023-10-16 ENCOUNTER — Other Ambulatory Visit (HOSPITAL_BASED_OUTPATIENT_CLINIC_OR_DEPARTMENT_OTHER): Payer: Self-pay

## 2023-10-21 ENCOUNTER — Encounter: Payer: Self-pay | Admitting: Internal Medicine

## 2023-11-03 ENCOUNTER — Other Ambulatory Visit (HOSPITAL_COMMUNITY): Payer: Self-pay

## 2023-11-03 ENCOUNTER — Other Ambulatory Visit: Payer: Self-pay

## 2023-11-09 ENCOUNTER — Other Ambulatory Visit (HOSPITAL_COMMUNITY): Payer: Self-pay

## 2023-11-09 NOTE — Progress Notes (Deleted)
 BH MD/PA/NP OP Progress Note  11/09/2023 11:12 AM Carolyn Miranda Miranda  MRN:  [id number]  Visit Diagnosis:  No diagnosis found.  Assessment: Carolyn Miranda is a 33 y.o. female with a history of anxiety, a seizure disorder, and chronic pain who presented to [facility] at [facility] for initial evaluation on 05/05/22.    During initial evaluation patient reported symptoms of anxiety including constant worry, racing thoughts, ruminations on past interactions, feeling overwhelmed, and impending dread for social interactions.  Patient endorsed experiencing restlessness secondary to her anxiety.  Patient denied any history of mania, psychosis, paranoia, SI/HI, or delusions.  Patient questioned diagnosis of ADHD and reported having done well in both school growing up and work more recently.  Of note patient was diagnosed with a seizure disorder in 2021 and there is concern of a possible autoimmune condition due to her elevated ANA.  She is scheduled with a rheumatologist for this and her chronic pain in December.  Patient met criteria for diagnosis of generalized anxiety disorder and social anxiety disorder and would benefit from medication management and connection with therapy.  While diagnosis of ADHD cannot be ruled out it does seem less likely as potential symptoms are more likely to be related to her anxiety.  Carolyn Miranda presents for follow-up evaluation. Today, 11/09/23, patient reports    some improvement in depressive symptoms following the reinitiation of Cymbalta.  She denies less hopelessness, irritability, negative thoughts, and amotivation compared to the past.  She does still have significant anxiety and ruminations that are likely further triggered by ongoing health concerns.  Patient did have a breakthrough seizure in the interim for the first time in 4 years.  She is following up with neurology for management and recent EEG was within normal limits.  We will  titrate the Cymbalta to 90 mg daily reviewed the risk and benefits. Patient has also been using the propranolol and Ativan with good effect.  She reports using the Ativan around 1-2 times a week.  Psychotherapeutic interventions were used during today's session. From 10:05 AM to 10:31 AM we used empathic listening techniques and provided support. Used supportive interviewing techniques to validate patients feelings. Worked on cognitive Carolyn framing techniques and focusing on behavioral activation.  Improvement was evidenced by patient's participation.   Plan: - Increase Cymbalta to 90 mg daily  - Can consider adding Latuda as adjunct therapy at next appointment - Continue propranolol to 10 mg TID prn for anxiety - Continue Ativan 0.5 mg daily prn for anxiety second line - Continue Topamax 100 mg BID for seizures managed by her neurologist - CBC, CMP, TSH, ANA reviewed - EEG from 10/07/23 reviewed and was WNL - Continue therapy with Tere Felts  - Follow up in a month  Chief Complaint:  No chief complaint on file.  HPI: Carolyn Miranda presents reporting that    she is hanging in there. A lot going on right now. She is feeling overwhelmed with her physical health. Shortly after her appointment she had a grand mal seizure for the first time in 4 years. After this occurred she has not been able to drive for 6 months (until August). She feels isolated and worried about how she is going to get anywhere at this point. Her mom and boyfriend have helped a lot, but she feels guilty that they have to plan around her. Provided support around the reoccurrence of her seizure, and the impact on her health.   Carolyn Miranda restarted the  Cymbalta and is back on the 60 mg daily dose.  Since restarting it she has found that she has been less depressed on the medication.  She has not feels hopeless as she had in the past and has an easier time with identifying positives.  She also has had decreased irritability and been able  to identify things that she is looking forward to.  The anxiety symptoms still persist and she describes feeling overwhelmed and anxious about her work, financial situation, and ongoing health issues.  Discussed medication options and opted to titrate Cymbalta to 90 mg daily after reviewing the risk and benefits.  We also did discuss Latuda and we will plan to start this at her next visit if symptoms continue to persist.  Patient continues to take propranolol as needed and the Ativan as a second line for anxiety around 1-2 times a week.  She denies any adverse effects of either medication.  Past Psychiatric History: Has been on medications in the past. Has tried Wellbutrin, Zoloft, possibly Prozac. Lamictal got a rash. Tried BuSpar which she think helped some.  Patient had a therapist in the past which she thought was helpful.  Connected with Gabrielle Dare  Past Medical History:  Past Medical History:  Diagnosis Date   Allergies    Anxiety    Asthma    Depression    Migraines    Seizure (HCC)    Urine incontinence     Past Surgical History:  Procedure Laterality Date   BREAST REDUCTION SURGERY Bilateral 07/08/2017   WISDOM TOOTH EXTRACTION      Family History:  Family History  Problem Relation Age of Onset   Mental illness Mother    Drug abuse Mother        overmedicating rx by psych   Miscarriages / [place] Mother    Depression Mother    Arthritis Mother    Diabetes Father    Arthritis Maternal Grandmother    Asthma Maternal Grandmother     Social History:  Social History   Socioeconomic History   Marital status: Single    Spouse name: Not on file   Number of children: Not on file   Years of education: Not on file   Highest education level: Not on file  Occupational History   Not on file  Tobacco Use   Smoking status: Never    Passive exposure: Current   Smokeless tobacco: Never  Vaping Use   Vaping status: Never Used  Substance and Sexual Activity    Alcohol use: Not Currently    Comment: socially   Drug use: No   Sexual activity: Not on file  Other Topics Concern   Not on file  Social History Narrative   Not on file   Social Drivers of Health   Financial Resource Strain: Not on file  Food Insecurity: Not on file  Transportation Needs: Not on file  Physical Activity: Not on file  Stress: Not on file  Social Connections: Not on file    Allergies:  Allergies  Allergen Reactions   Bupropion Nausea And Vomiting and Other (See Comments)   Celebrex [Celecoxib] Other (See Comments)    Caused wheezing    Lamotrigine Rash    Rash on left side of face Rash on left side of face Rash on left side of face Rash on left side of face    Celexa [Citalopram Hydrobromide] Diarrhea and Nausea And Vomiting   Other     Dairy  Current Medications: Current Outpatient Medications  Medication Sig Dispense Refill   albuterol (VENTOLIN HFA) 108 (90 Base) MCG/ACT inhaler Inhale 1 puff into the lungs every 4 (four) hours. 18 g 3   albuterol (VENTOLIN HFA) 108 (90 Base) MCG/ACT inhaler Inhale 2 puffs into the lungs every 6 (six) hours as needed for wheezing or shortness of breath. 18 g 0   clindamycin (CLINDAGEL) 1 % gel APPLY A THIN LAYER TO THE AFFECTED AREA(S) BY TOPICAL ROUTE 2 TIMES PER DAY 30 g 3   cyclobenzaprine (FLEXERIL) 5 MG tablet Take 1 tablet (5 mg total) by mouth at bedtime as needed for muscle spasms. 30 tablet 1   diclofenac Sodium (VOLTAREN ARTHRITIS PAIN) 1 % GEL Apply 4 g topically 4 (four) times daily. 150 g 3   DULoxetine (CYMBALTA) 30 MG capsule Take 1 capsule (30 mg total) by mouth daily. Take with a 60 mg dose for a total of 90 mg daily 30 capsule 2   DULoxetine (CYMBALTA) 60 MG capsule Take 1 capsule (60 mg total) by mouth daily. Take 60 mg in the evening 30 capsule 2   fluticasone (FLONASE) 50 MCG/ACT nasal spray Place 2 sprays into both nostrils daily. 16 g 6   fluticasone-salmeterol (ADVAIR) 100-50 MCG/ACT AEPB  Inhale 1 puff into the lungs 2 (two) times daily. 60 each 1   LORazepam (ATIVAN) 0.5 MG tablet Take 1 tablet (0.5 mg total) by mouth daily as needed for anxiety. Second line after propranolol 30 tablet 0   pantoprazole (PROTONIX) 40 MG tablet Take 1 tablet (40 mg total) by mouth daily. 30 tablet 1   propranolol (INDERAL) 10 MG tablet Take 1 tablet (10 mg total) by mouth 3 (three) times daily as needed (anxiety). 90 tablet 2   topiramate (TOPAMAX) 100 MG tablet Take 2 tablets (200 mg total) by mouth daily at night. 180 tablet 3   No current facility-administered medications for this visit.     Psychiatric Specialty Exam: Review of Systems  There were no vitals taken for this visit.There is no height or weight on file to calculate BMI.  General Appearance: Fairly Groomed  Eye Contact:  Good  Speech:  Clear and Coherent and Normal Rate  Volume:  Normal  Mood:  Depressed  Affect:  Congruent and Tearful  Thought Process:  Coherent  Orientation:  Full (Time, Place, and Person)  Thought Content: Logical   Suicidal Thoughts:  No  Homicidal Thoughts:  No  Memory:  Immediate;   Fair  Judgement:  Fair  Insight:  Fair  Psychomotor Activity:  Normal  Concentration:  Concentration: Fair  Recall:  Fair  Fund of Knowledge: Fair  Language: Good  Akathisia:  No    AIMS (if indicated): not done  Assets:  Communication Skills Desire for Improvement Financial Resources/Insurance Housing Social Support  ADL's:  Intact  Cognition: WNL  Sleep:  Fair   Metabolic Disorder Labs: Lab Results  Component Value Date   HGBA1C 4.7 01/16/2015   No results found for: "PROLACTIN" Lab Results  Component Value Date   CHOL 204 (H) 01/16/2015   TRIG 358.0 (H) 01/16/2015   HDL 47.80 01/16/2015   CHOLHDL 4 01/16/2015   VLDL 71.6 (H) 01/16/2015   Lab Results  Component Value Date   TSH 2.39 04/02/2022   TSH 4.54 (H) 01/16/2015    Therapeutic Level Labs: No results found for: "LITHIUM" No  results found for: "VALPROATE" No results found for: "CBMZ"   Screenings: PHQ2-9  Flowsheet Row Office Visit from 05/26/2023 in [facility] Office Visit from 05/06/2023 in [facility] Office Visit from 03/24/2023 in [facility] Office Visit from 12/19/2022 in [facility] Office Visit from 11/21/2022 in [facility] at [facility]  PHQ-2 Total Score 4 3 4 3  0  PHQ-9 Total Score -- 14 -- 16 --      Flowsheet Row ED from 09/17/2023 in [facility] Emergency Department at [facility]  C-SSRS RISK CATEGORY No Risk       Collaboration of Care: Collaboration of Care: Medication Management AEB medication management and Other provider involved in patient's care AEB rheumatology chart review  Patient/Guardian was advised Release of Information must be obtained prior to any record release in order to collaborate their care with an outside provider. Patient/Guardian was advised if they have not already done so to contact the registration department to sign all necessary forms in order for us  to release information regarding their care.   Consent: Patient/Guardian gives verbal consent for treatment and assignment of benefits for services provided during this visit. Patient/Guardian expressed understanding and agreed to proceed.    Yves Herb, MD 11/09/2023, 11:12 AM   Virtual Visit via Video Note  I connected with Carolyn Miranda 11/09/23 at 11:00 AM EDT by a video enabled telemedicine application and verified that I am speaking with the correct person using two identifiers.  Location: Patient: Home Provider: Home Office   I discussed the limitations of evaluation and management by telemedicine and the availability of in person appointments. The patient expressed understanding and agreed to proceed.   I discussed the  assessment and treatment plan with the patient. The patient was provided an opportunity to ask questions and all were answered. The patient agreed with the plan and demonstrated an understanding of the instructions.   The patient was advised to call back or seek an in-person evaluation if the symptoms worsen or if the condition fails to improve as anticipated.  I provided 35 minutes of non-face-to-face time during this encounter.   Yves Herb, MD

## 2023-11-11 ENCOUNTER — Encounter (HOSPITAL_COMMUNITY): Payer: Self-pay

## 2023-11-11 ENCOUNTER — Telehealth (HOSPITAL_COMMUNITY): Payer: Self-pay | Admitting: Psychiatry

## 2023-11-11 NOTE — Telephone Encounter (Signed)
 Patient will call to schedule follow up appointment with Dr. Sharalyn Dasen once insurance is reinstated. Urgent Care resources has been sent to patient via mychart message

## 2023-11-12 ENCOUNTER — Telehealth (HOSPITAL_COMMUNITY): Admitting: Psychiatry

## 2023-12-15 ENCOUNTER — Ambulatory Visit (INDEPENDENT_AMBULATORY_CARE_PROVIDER_SITE_OTHER): Payer: Self-pay | Admitting: Neurology

## 2023-12-15 ENCOUNTER — Encounter: Payer: Self-pay | Admitting: Neurology

## 2023-12-15 ENCOUNTER — Other Ambulatory Visit (HOSPITAL_COMMUNITY): Payer: Self-pay

## 2023-12-15 ENCOUNTER — Other Ambulatory Visit: Payer: Self-pay

## 2023-12-15 VITALS — BP 90/59 | HR 70 | Ht 64.5 in | Wt 215.2 lb

## 2023-12-15 DIAGNOSIS — G40109 Localization-related (focal) (partial) symptomatic epilepsy and epileptic syndromes with simple partial seizures, not intractable, without status epilepticus: Secondary | ICD-10-CM

## 2023-12-15 MED ORDER — TOPIRAMATE 100 MG PO TABS
ORAL_TABLET | ORAL | 3 refills | Status: DC
  Filled 2023-12-15: qty 225, 90d supply, fill #0

## 2023-12-15 NOTE — Progress Notes (Signed)
 NEUROLOGY CONSULTATION NOTE  Carolyn Miranda MRN: [id number] DOB: February 21, 1991  Referring provider: Alyson Back, NP-C Primary care provider: Alyson Back, NP-C  Reason for consult:  seizures  Thank you for your kind referral of Carolyn Miranda for consultation of the above symptoms. Although her history is well known to you, please allow me to reiterate it for the purpose of our medical record. The patient was accompanied to the clinic by her mother who also provides collateral information. Records and images were personally reviewed where available.   HISTORY OF PRESENT ILLNESS: This is a pleasant 33 year old right-handed woman with a history of epilepsy, fibromyalgia, anxiety, depression, presenting to establish care for seizures. Records were reviewed. The first seizure occurred in January 2021, she woke up feeling off that morning and was putting on her makeup when her mother witnessed her start having head deviation to the right followed by tonic stiffening. 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 She was seen by neurologist Dr. 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 She was started on seizure medication after she had a second seizure in March 2021, her mother reports she was staring and not responding then again had head deviation to the right with tonic stiffening. She bit the left side of her tongue. She was on Topiramate  100mg  BID without side effects or seizures for almost 4 years until she had a seizure on 09/17/23. No prior warning symptoms, her boyfriend heard her making sounds and found her convulsing with water going down her mouth. It lasted 2 minutes, she was brought to the ER where bloodwork was unremarkable. Topiramate  level was 3.5. She denied missing any medications, no sleep deprivation. There was more stress recently. The weekend prior, she also had a GI illness with diarrhea and some vomiting. She saw her prior neurologist at [facility] and EEG was ordered which was normal on 09/2023.   She denies any other episodes of staring/unresponsiveness, no loss of time, olfactory/gustatory hallucinations, deja vu, rising epigastric sensation, focal numbness/tingling/weakness, myoclonic jerks in the daytime. She has noticed hypnic jerks as she is going to sleep. She denies any significant headaches, she has had more recently which she attributes to the weather. She has some dizziness which she feels is related to the fibromyalgia. She has chronic neck/back/diffuse body pains. No diplopia, dysarthria/dysphagia, bowel/bladder dysfunction. She has trouble with sleep but tries to get 6-8 hours of sleep. She feels sleepy a lot. Memory is bad. She has to write everything down. She denies missing medications. She works as a Conservation officer, nature at [employer]. No pregnancy plans, she has an IUD.   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  EEG 09/2023 normal  PAST MEDICAL HISTORY: Past Medical History:  Diagnosis Date   Allergies    Anxiety    Asthma    Depression    Migraines    Seizure (HCC)    Urine incontinence     PAST SURGICAL HISTORY: Past Surgical History:  Procedure Laterality Date   BREAST REDUCTION SURGERY Bilateral 07/08/2017   WISDOM TOOTH EXTRACTION      MEDICATIONS: Current Outpatient Medications on File Prior to Visit  Medication Sig Dispense Refill   albuterol  (VENTOLIN  HFA) 108 (90 Base) MCG/ACT inhaler Inhale 1 puff into the lungs every 4 (four) hours. 18 g 3   albuterol  (VENTOLIN  HFA) 108 (90 Base) MCG/ACT inhaler Inhale 2 puffs into the lungs every 6 (six) hours as needed for wheezing or shortness of breath. 18 g 0   cyclobenzaprine  (FLEXERIL ) 5 MG tablet Take 1 tablet (5 mg total) by mouth at bedtime as needed for muscle spasms. 30 tablet 1   diclofenac  Sodium (VOLTAREN  ARTHRITIS PAIN) 1 % GEL Apply 4 g topically 4 (four) times daily. 150 g 3   DULoxetine  (CYMBALTA ) 30 MG capsule Take 1 capsule (30 mg total) by mouth daily. Take with a 60 mg dose for a total of 90 mg daily 30 capsule 2   DULoxetine  (CYMBALTA ) 60 MG capsule Take 1 capsule (60 mg total) by mouth daily. Take 60 mg in the evening 30 capsule 2    fluticasone -salmeterol (ADVAIR ) 100-50 MCG/ACT AEPB Inhale 1 puff into the lungs 2 (two) times daily. 60 each 1   LORazepam  (ATIVAN ) 0.5 MG tablet Take 1 tablet (0.5 mg total) by mouth daily as needed for anxiety. Second line after propranolol  30 tablet 0   propranolol  (INDERAL ) 10 MG tablet Take 1 tablet (10 mg total) by mouth 3 (three) times daily as needed (anxiety). 90 tablet 2   topiramate  (TOPAMAX ) 100 MG tablet Take 2 tablets (200 mg total) by mouth daily at night. 180 tablet 3   clindamycin  (CLINDAGEL ) 1 % gel APPLY A THIN LAYER TO THE AFFECTED AREA(S) BY TOPICAL ROUTE 2 TIMES PER DAY (Patient not taking: Reported on 12/15/2023) 30 g 3   fluticasone  (FLONASE ) 50 MCG/ACT nasal spray Place 2 sprays into both nostrils daily. (Patient not taking: Reported on 12/15/2023) 16 g 6   No current facility-administered medications on file prior to visit.    ALLERGIES: Allergies  Allergen Reactions   Bupropion  Nausea And Vomiting and Other (See Comments)   Celebrex  [Celecoxib ] Other (See Comments)    Caused wheezing    Lamotrigine Rash    Rash on left side of face Rash on left side of face Rash on left side of face Rash on left side of face    Celexa  [Citalopram  Hydrobromide] Diarrhea  and Nausea And Vomiting   Other     Dairy     FAMILY HISTORY: Family History  Problem Relation Age of Onset   Mental illness Mother    Drug abuse Mother        overmedicating rx by psych   Miscarriages / [place] Mother    Depression Mother    Arthritis Mother    Diabetes Father    Arthritis Maternal Grandmother    Asthma Maternal Grandmother     SOCIAL HISTORY: Social History   Socioeconomic History   Marital status: Single    Spouse name: Not on file   Number of children: Not on file   Years of education: Not on file   Highest education level: Not on file  Occupational History   Not on file  Tobacco Use   Smoking status: Never    Passive exposure: Current   Smokeless tobacco: Never   Vaping Use   Vaping status: Never Used  Substance and Sexual Activity   Alcohol use: Not Currently    Comment: socially   Drug use: No   Sexual activity: Not on file  Other Topics Concern   Not on file  Social History Narrative   Are you right handed or left handed? Right    Are you currently employed ? yes   What is your current occupation? Part time [employer]    Do you live at home alone? no   Who lives with you? Lives with boyfriend    What type of home do you live in: 1 story or 2 story?  1 story few steps going in        Social Drivers of Corporate investment banker Strain: Not on file  Food Insecurity: Not on file  Transportation Needs: Not on file  Physical Activity: Not on file  Stress: Not on file  Social Connections: Not on file  Intimate Partner Violence: Not on file     PHYSICAL EXAM: Vitals:   12/15/23 0900  BP: (!) 90/59  Pulse: 70  SpO2: 97%   General: No acute distress Head:  Normocephalic/atraumatic Skin/Extremities: No rash, no edema Neurological Exam: Mental status: alert and oriented to person, place, and time, no dysarthria or aphasia, Fund of knowledge is appropriate.  Recent and remote memory are intact, 3/3 delayed recall.  Attention and concentration are normal, 5/5 WORLD backwards.  Cranial nerves: CN I: not tested CN II: pupils equal, round, visual fields intact CN III, IV, VI:  full range of motion, no nystagmus, no ptosis CN V: facial sensation intact CN VII: upper and lower face symmetric CN VIII: hearing intact to conversation Bulk & Tone: normal, no fasciculations. Motor: 5/5 throughout with no pronator drift. Sensation: intact to light touch, cold, pin, vibration sense.  No extinction to double simultaneous stimulation.  Romberg test negative Deep Tendon Reflexes: +2 throughout Cerebellar: no incoordination on finger to nose testing Gait: narrow-based and steady, able to tandem walk adequately. Tremor:  none   IMPRESSION: This is a pleasant 33 year old right-handed woman with a history of epilepsy, fibromyalgia, anxiety, depression, presenting to establish care for seizures. She had been seizure-free for 4 years until 09/17/2023. Since her initial MRI in 2021 showed changes in the left temporal lobe, we discussed repeating MRI brain with and without contrast for interval follow-up. A 24-hour EEG will be ordered to further characterize seizures. Topiramate  level was 3.5, we discussed increasing dose to 100mg  in AM, 150mg  in PM, monitor  for side effects. No pregnancy plans. She is aware of [place] driving laws to stop driving after a seizure until 6 months seizure-free. Follow-up in 3 months, call for any changes.   .  Thank you for allowing me to participate in the care of this patient. Please do not hesitate to call for any questions or concerns.   Rayfield Cairo, M.D.  CC: Alyson Back, NP-C

## 2023-12-15 NOTE — Patient Instructions (Signed)
 Good to meet you.  Schedule MRI brain with and without contrast  2. Schedule 24-hour EEG  3. Increase Topiramate  100mg : take 1 tablet in AM, 1 and 1/2 tablets in PM  4. Follow-up in 3 months, call for any changes   Seizure Precautions: 1. If medication has been prescribed for you to prevent seizures, take it exactly as directed.  Do not stop taking the medicine without talking to your doctor first, even if you have not had a seizure in a long time.   2. Avoid activities in which a seizure would cause danger to yourself or to others.  Don't operate dangerous machinery, swim alone, or climb in high or dangerous places, such as on ladders, roofs, or girders.  Do not drive unless your doctor says you may.  3. If you have any warning that you may have a seizure, lay down in a safe place where you can't hurt yourself.    4.  No driving for 6 months from last seizure, as per [place]  state law.   Please refer to the following link on the Epilepsy Foundation of America's website for more information: http://www.epilepsyfoundation.org/answerplace/Social/driving/drivingu.cfm   5.  Maintain good sleep hygiene. Avoid alcohol  6.  Notify your neurology if you are planning pregnancy or if you become pregnant.  7.  Contact your doctor if you have any problems that may be related to the medicine you are taking.  8.  Call 911 and bring the patient back to the ED if:        A.  The seizure lasts longer than 5 minutes.       B.  The patient doesn't awaken shortly after the seizure  C.  The patient has new problems such as difficulty seeing, speaking or moving  D.  The patient was injured during the seizure  E.  The patient has a temperature over 102 F (39C)  F.  The patient vomited and now is having trouble breathing

## 2023-12-23 ENCOUNTER — Telehealth: Payer: Self-pay | Admitting: *Deleted

## 2023-12-30 ENCOUNTER — Telehealth: Payer: Self-pay | Admitting: *Deleted

## 2024-01-03 ENCOUNTER — Encounter (HOSPITAL_COMMUNITY): Payer: Self-pay

## 2024-01-03 ENCOUNTER — Emergency Department (HOSPITAL_COMMUNITY)
Admission: EM | Admit: 2024-01-03 | Discharge: 2024-01-03 | Disposition: A | Discharge status: Home / Self Care | Payer: Self-pay | Attending: Emergency Medicine | Admitting: Emergency Medicine

## 2024-01-03 ENCOUNTER — Other Ambulatory Visit: Payer: Self-pay

## 2024-01-03 DIAGNOSIS — R479 Unspecified speech disturbances: Secondary | ICD-10-CM

## 2024-01-03 DIAGNOSIS — Z7951 Long term (current) use of inhaled steroids: Secondary | ICD-10-CM | POA: Insufficient documentation

## 2024-01-03 DIAGNOSIS — J45909 Unspecified asthma, uncomplicated: Secondary | ICD-10-CM | POA: Insufficient documentation

## 2024-01-03 DIAGNOSIS — R4789 Other speech disturbances: Secondary | ICD-10-CM | POA: Insufficient documentation

## 2024-01-03 DIAGNOSIS — Z7722 Contact with and (suspected) exposure to environmental tobacco smoke (acute) (chronic): Secondary | ICD-10-CM | POA: Insufficient documentation

## 2024-01-03 LAB — BASIC METABOLIC PANEL WITH GFR
Anion gap: 9 (ref 5–15)
BUN: 11 mg/dL (ref 6–20)
CO2: 18 mmol/L — ABNORMAL LOW (ref 22–32)
Calcium: 9 mg/dL (ref 8.9–10.3)
Chloride: 111 mmol/L (ref 98–111)
Creatinine, Ser: 0.59 mg/dL (ref 0.44–1.00)
GFR, Estimated: >60 mL/min (ref >60)
Glucose, Bld: 93 mg/dL (ref 70–99)
Potassium: 3.8 mmol/L (ref 3.5–5.1)
Sodium: 138 mmol/L (ref 135–145)

## 2024-01-03 LAB — CBC WITH DIFFERENTIAL/PLATELET
Abs Immature Granulocytes: 0.03 10*3/uL (ref 0.00–0.07)
Basophils Absolute: 0 10*3/uL (ref 0.0–0.1)
Basophils Relative: 0 %
Eosinophils Absolute: 0.6 10*3/uL — ABNORMAL HIGH (ref 0.0–0.5)
Eosinophils Relative: 10 %
HCT: 40.3 % (ref 36.0–46.0)
Hemoglobin: 13.3 g/dL (ref 12.0–15.0)
Immature Granulocytes: 1 %
Lymphocytes Relative: 34 %
Lymphs Abs: 2 10*3/uL (ref 0.7–4.0)
MCH: 29.1 pg (ref 26.0–34.0)
MCHC: 33 g/dL (ref 30.0–36.0)
MCV: 88.2 fL (ref 80.0–100.0)
Monocytes Absolute: 0.4 10*3/uL (ref 0.1–1.0)
Monocytes Relative: 7 %
Neutro Abs: 3 10*3/uL (ref 1.7–7.7)
Neutrophils Relative %: 48 %
Platelets: 278 10*3/uL (ref 150–400)
RBC: 4.57 MIL/uL (ref 3.87–5.11)
RDW: 13.3 % (ref 11.5–15.5)
WBC: 6 10*3/uL (ref 4.0–10.5)
nRBC: 0 % (ref 0.0–0.2)

## 2024-01-03 LAB — MAGNESIUM: Magnesium: 2.2 mg/dL (ref 1.7–2.4)

## 2024-01-03 LAB — PREGNANCY, URINE: Preg Test, Ur: NEGATIVE

## 2024-01-03 NOTE — ED Triage Notes (Addendum)
 Pt arrives via POV. Pt reports around 1045 this morning she was speaking to her boyfriend and suddenly wasn't able to speak. She states that lasted about 15 minutes. Pt states she is now back to her normal self. She reports hx of seizures and anxiety. Pt states she was feeling very anxious at the time she was unable to speak this morning. Pt is AxOx4. She reports she is under a lot of stress. Denies missing any doses of her Topamax . She states she did take a dose of her ativan  and states it was effective.

## 2024-01-03 NOTE — ED Provider Notes (Signed)
 [place] EMERGENCY DEPARTMENT AT [facility] Provider Note  CSN: [id number] Arrival date & time: 01/03/24 1215  Chief Complaint(s) Anxiety and Difficulty Speaking, Resolved  HPI Carolyn Miranda is a 33 y.o. female who is here today after a brief period where she was unable to speak.  Patient has a history of seizure disorder, anxiety.  She takes Topamax .  Patient reports that she was getting ready to go to work, began to have a period where she was unable to speak.  She was with her boyfriend at the time who witnessed the event.  She took Ativan  and felt improved.   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/05/2022   GAD (generalized anxiety disorder) 05/05/2022   Antinuclear antibody (ANA) titer greater than 1:80 04/08/2022   Back pain 04/02/2022   Fibromyalgia 04/02/2022   Pelvic pain 04/02/2022   Bilateral hand pain 04/02/2022   Seizures (HCC) 11/02/2019   Encounter for monitoring anticonvulsant therapy 11/01/2019   Allergic rhinitis 03/26/2018   Neck pain 12/27/2015   Encounter for birth control 08/24/2015   Asthma 04/20/2015   Adjustment disorder with mixed anxiety and depressed mood 01/16/2015   Obesity (BMI 30-39.9) 01/16/2015   Home Medication(s) Prior to Admission medications   Medication Sig Start Date End Date Taking? Authorizing Provider  albuterol  (VENTOLIN  HFA) 108 (90 Base) MCG/ACT inhaler Inhale 1 puff into the lungs every 4 (four) hours. 01/11/21     albuterol  (VENTOLIN  HFA) 108 (90 Base) MCG/ACT inhaler Inhale 2 puffs into the lungs every 6 (six) hours as needed for wheezing or  shortness of breath. 09/30/23   Henson, Vickie L, NP-C  clindamycin  (CLINDAGEL ) 1 % gel APPLY A THIN LAYER TO THE AFFECTED AREA(S) BY TOPICAL ROUTE 2 TIMES PER DAY Patient not taking: Reported on 12/15/2023 02/26/21     cyclobenzaprine  (FLEXERIL ) 5 MG tablet Take 1 tablet (5 mg total) by mouth at bedtime as needed for muscle spasms. 09/30/23   Matt Song, MD  diclofenac  Sodium (VOLTAREN  ARTHRITIS PAIN) 1 % GEL Apply 4 g topically 4 (four) times daily. 12/19/22   Lylia Sand, MD  DULoxetine  (CYMBALTA ) 30 MG capsule Take 1 capsule (30 mg total) by mouth daily. Take with a 60 mg dose for a total of 90 mg daily 10/15/23   Yves Herb, MD  DULoxetine  (CYMBALTA ) 60 MG capsule Take 1 capsule (60 mg total) by mouth daily. Take 60 mg in the evening 09/17/23   Yves Herb, MD  fluticasone  (FLONASE ) 50 MCG/ACT nasal spray Place 2 sprays into both nostrils daily. Patient not taking: Reported on 12/15/2023 03/25/18   Adelia Homestead, MD  fluticasone -salmeterol (ADVAIR ) 100-50 MCG/ACT AEPB Inhale 1 puff into the lungs 2 (two) times daily. 05/06/23   Henson, Vickie L, NP-C  levonorgestrel (MIRENA) 20 MCG/DAY IUD 1 each by Intrauterine route once.    [provider]  LORazepam  (ATIVAN ) 0.5 MG tablet Take 1 tablet (0.5 mg total) by mouth daily as needed for anxiety. Second line after propranolol  09/17/23   Yves Herb, MD  propranolol  (INDERAL ) 10 MG tablet Take 1 tablet (10 mg total) by mouth 3 (three) times daily as needed (anxiety). 05/28/23   Yves Herb, MD  topiramate  (TOPAMAX ) 100 MG tablet Take 1 tablet in AM, 1 and 1/2 tablets in PM 12/15/23   Jhonny Moss, MD                                                                                                                                    Past Surgical History Past Surgical History:  Procedure Laterality Date   BREAST REDUCTION SURGERY Bilateral 07/08/2017   WISDOM TOOTH EXTRACTION     Family History Family History   Problem Relation Age of Onset   Mental illness Mother    Drug abuse Mother        overmedicating rx by psych   Miscarriages / [place] Mother    Depression Mother    Arthritis Mother    Diabetes Father    Arthritis Maternal Grandmother    Asthma Maternal Grandmother     Social History Social History   Tobacco Use   Smoking status: Never    Passive exposure: Current   Smokeless tobacco: Never  Vaping Use   Vaping status: Never Used  Substance Use Topics   Alcohol use: Not Currently    Comment: socially   Drug use: No   Allergies Bupropion , Celebrex  [celecoxib ], Lamotrigine, Celexa  [citalopram  hydrobromide], and Other  Review of Systems Review of Systems  Physical Exam Vital Signs  I have reviewed the triage vital signs BP (!) 129/93 (BP Location: Left Arm)   Pulse 79   Temp 97.8 F (36.6 C) (Oral)   Resp 18   SpO2 100%   Physical Exam Vitals and nursing note reviewed.  Eyes:     Pupils: Pupils are equal, round, and reactive to light.  Cardiovascular:     Rate and Rhythm: Normal rate.  Pulmonary:     Effort: Pulmonary effort is normal.  Abdominal:     General: Abdomen is flat. There is no distension.     Palpations: Abdomen is soft.     Tenderness: There is no abdominal tenderness.  Musculoskeletal:        General: Normal range of motion.  Neurological:     General: No focal deficit present.     Mental Status: She is alert and oriented to person, place, and time.     Cranial Nerves: No cranial nerve deficit.     Motor: No weakness.     Gait: Gait normal.     ED Results and Treatments Labs (all labs ordered are listed, but only abnormal results are displayed) Labs Reviewed  BASIC METABOLIC PANEL WITH GFR  CBC WITH DIFFERENTIAL/PLATELET  MAGNESIUM  PREGNANCY, URINE  Radiology No results found.  Pertinent labs & imaging  results that were available during my care of the patient were reviewed by me and considered in my medical decision making (see MDM for details).  Medications Ordered in ED Medications - No data to display                                                                                                                                   Procedures Procedures  (including critical care time)  Medical Decision Making / ED Course   This patient presents to the ED for concern of brief period of difficulty speaking, this involves an extensive number of treatment options, and is a complaint that carries with it a high risk of complications and morbidity.  The differential diagnosis includes focal seizure, anxiety, TIA.  MDM: Patient with no neurological deficits on exam.  She is overall well-appearing.  Patient center lasted for 15 minutes before resolving with Ativan .  My suspicion is that this is anxiety versus a focal seizure.  Patient without any neurological deficits or significant risk factors which are predispose her to TIA.  Do not believe imaging is indicated.  Will check basic labs on the patient.  Reassessment 3 PM-labs reviewed, normal electrolytes, negative pregnancy.  Will discharge patient with follow-up with her neurologist.   Additional history obtained: -Additional history obtained from mother at bedside -External records from outside source obtained and reviewed including: Chart review including previous notes, labs, imaging, consultation notes   Lab Tests: -I ordered, reviewed, and interpreted labs.   The pertinent results include:   Labs Reviewed  BASIC METABOLIC PANEL WITH GFR  CBC WITH DIFFERENTIAL/PLATELET  MAGNESIUM  PREGNANCY, URINE      Medicines ordered and prescription drug management: No orders of the defined types were placed in this encounter.   -I have reviewed the patients home medicines and have made adjustments as needed   Reevaluation: After the  interventions noted above, I reevaluated the patient and found that they have :improved  Co morbidities that complicate the patient evaluation  Past Medical History:  Diagnosis Date   Allergies    Anxiety    Asthma    Depression    Migraines    Seizure (HCC)    Urine incontinence       Dispostion: I considered admission for this patient, however she is appropriate for outpatient follow-up.     Final Clinical Impression(s) / ED Diagnoses Final diagnoses:  None     @PCDICTATION @    Afton Horse T, DO 01/03/24 1503

## 2024-01-03 NOTE — Discharge Instructions (Addendum)
 While you were in the emergency room, you had blood work done that was normal.  I think that your symptoms could have been caused by a focal seizure, or possibly some stress that you are currently under.  Continue take all of your medications as prescribed.  Please follow-up with your neurologist.  You can give them a call this week and get a follow-up appointment.  Return to the emergency room if you develop any weakness, difficulty walking, repeated episodes of difficulty with your speech.

## 2024-01-05 ENCOUNTER — Telehealth: Payer: Self-pay | Admitting: *Deleted

## 2024-01-22 ENCOUNTER — Other Ambulatory Visit (HOSPITAL_COMMUNITY): Payer: Self-pay

## 2024-01-22 ENCOUNTER — Other Ambulatory Visit (HOSPITAL_COMMUNITY): Payer: Self-pay | Admitting: Psychiatry

## 2024-01-22 DIAGNOSIS — F411 Generalized anxiety disorder: Secondary | ICD-10-CM

## 2024-01-22 DIAGNOSIS — F4010 Social phobia, unspecified: Secondary | ICD-10-CM

## 2024-01-22 DIAGNOSIS — M797 Fibromyalgia: Secondary | ICD-10-CM

## 2024-01-27 ENCOUNTER — Other Ambulatory Visit (HOSPITAL_COMMUNITY): Payer: Self-pay

## 2024-02-24 ENCOUNTER — Encounter: Payer: Self-pay | Admitting: Neurology

## 2024-02-24 NOTE — Telephone Encounter (Signed)
**Note De-identified  [place] Obfuscation** Please advise 

## 2024-02-26 ENCOUNTER — Encounter: Payer: Self-pay | Admitting: Neurology

## 2024-03-23 ENCOUNTER — Other Ambulatory Visit (HOSPITAL_COMMUNITY): Payer: Self-pay

## 2024-03-23 ENCOUNTER — Encounter: Payer: Self-pay | Admitting: Neurology

## 2024-03-23 ENCOUNTER — Ambulatory Visit (INDEPENDENT_AMBULATORY_CARE_PROVIDER_SITE_OTHER): Payer: Self-pay | Admitting: Neurology

## 2024-03-23 ENCOUNTER — Ambulatory Visit: Payer: Self-pay | Admitting: Neurology

## 2024-03-23 VITALS — BP 105/76 | HR 67 | Resp 20 | Ht 64.5 in | Wt 212.0 lb

## 2024-03-23 DIAGNOSIS — G40109 Localization-related (focal) (partial) symptomatic epilepsy and epileptic syndromes with simple partial seizures, not intractable, without status epilepticus: Secondary | ICD-10-CM

## 2024-03-23 MED ORDER — TOPIRAMATE 100 MG PO TABS
150.0000 mg | ORAL_TABLET | Freq: Two times a day (BID) | ORAL | 3 refills | Status: DC
  Filled 2024-03-23: qty 270, 90d supply, fill #0
  Filled 2024-07-04: qty 270, 90d supply, fill #1

## 2024-03-23 NOTE — Progress Notes (Signed)
 NEUROLOGY FOLLOW UP OFFICE NOTE  Carolyn Miranda [id number] [birth year]  HISTORY OF PRESENT ILLNESS: I had the pleasure of seeing Carolyn Miranda in follow-up in the neurology clinic on 03/23/2024.  The patient was last seen 3 months ago for epilepsy. She is again  accompanied by her mother who helps supplement the history today.  Records and images were personally reviewed where available.  On her last visit, Topiramate  was increased to 100mg  in AM, 150mg  in PM. Repeat MRI and 24-hour EEG were ordered however not completed due to insurance issues. She was in the ER on 01/03/24 after an episode where she was unable to speak.She was getting ready for work, talking to her boyfriend, then suddenly she could not form any words. She could repeat a couple of words and tried to type, but was only able to type 3 words that did not make sense. Symptoms lasted 15-20 minutes, no associated headache, focal numbness/tingling/weakness, loss of awareness. She took Ativan  and felt better. It has happened a couple of times since then, the last was a couple of weeks ago while in line at [employer] and she could not talk. They have all occurred before going to work, which has been an increasingly stressful environment for her. Despite letters from her physicians, she states her job is refusing to accommodate her, forcing her to go on medical leave, which is adding to her stress level. She wakes up every single day in a panic, sick to her stomach.Her last day at work was 8/20, she has not had any further speech changes since then. She reports a fall in the bathtub yesterday.    History on Initial Assessment 12/15/2023: This is a pleasant 33 year old right-handed woman with a history of epilepsy, fibromyalgia, anxiety, depression, presenting to establish care for seizures. Records were reviewed. The first seizure occurred in January 2021, she woke up feeling off that morning and was putting on her makeup when her mother  witnessed her start having head deviation to the right followed by tonic stiffening. 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 She was seen by neurologist Dr. 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 She was started on seizure medication after she had a second seizure in March 2021, her mother reports she was staring and not responding then again had head deviation to the right with tonic stiffening. She bit the left side of her tongue. She was on Topiramate  100mg  BID without side effects or seizures for almost 4 years until she had a seizure on 09/17/23. No prior warning symptoms, her boyfriend heard her making sounds and found her convulsing with water going down her  mouth. It lasted 2 minutes, she was brought to the ER where bloodwork was unremarkable. Topiramate  level was 3.5. She denied missing any medications, no sleep deprivation. There was more stress recently. The weekend prior, she also had a GI illness with diarrhea and some vomiting. She saw her prior neurologist at  [facility] Neurology and EEG was ordered which was normal on 09/2023.   She denies any other episodes of staring/unresponsiveness, no loss of time, olfactory/gustatory hallucinations, deja vu, rising epigastric sensation, focal numbness/tingling/weakness, myoclonic jerks in the daytime. She has noticed hypnic jerks as she is going to sleep. She denies any significant headaches, she has had more recently which she attributes to the weather. She has some dizziness which she feels is related to the fibromyalgia. She has chronic neck/back/diffuse body pains. No diplopia, dysarthria/dysphagia, bowel/bladder dysfunction. She has trouble with sleep but tries to get 6-8 hours of sleep. She feels sleepy a lot. Memory is bad. She has to write everything down. She denies missing medications. She works as a Conservation officer, nature at [employer]. No pregnancy plans, she has an IUD.   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  EEG 09/2023 normal   PAST MEDICAL HISTORY: Past Medical History:  Diagnosis Date   Allergies    Anxiety    Asthma    Depression    Migraines    Seizure (HCC)    Urine incontinence      MEDICATIONS: Current Outpatient Medications on File Prior to Visit  Medication Sig Dispense Refill   albuterol  (VENTOLIN  HFA) 108 (90 Base) MCG/ACT inhaler Inhale 1 puff into the lungs every 4 (four) hours. 18 g 3   albuterol  (VENTOLIN  HFA) 108 (90 Base) MCG/ACT inhaler Inhale 2 puffs into the lungs every 6 (six) hours as needed for wheezing or shortness of breath. 18 g 0   clindamycin  (CLINDAGEL ) 1 % gel APPLY A THIN LAYER TO THE AFFECTED AREA(S) BY TOPICAL ROUTE 2 TIMES PER DAY 30 g 3   cyclobenzaprine  (FLEXERIL ) 5 MG tablet Take 1 tablet (5 mg total) by mouth at bedtime as needed for muscle spasms. 30 tablet 1   diclofenac  Sodium (VOLTAREN  ARTHRITIS PAIN) 1 % GEL Apply 4 g topically 4 (four) times daily. 150 g 3   DULoxetine  (CYMBALTA ) 30 MG capsule Take 1 capsule (30 mg total) by mouth daily. Take with a 60 mg dose for a total of 90 mg daily 30 capsule 2   DULoxetine  (CYMBALTA ) 60 MG capsule Take 1 capsule (60 mg total) by mouth daily. Take 60 mg in the evening 30 capsule 2   fluticasone  (FLONASE ) 50 MCG/ACT nasal spray Place 2 sprays into both nostrils daily. 16 g 6   fluticasone -salmeterol (ADVAIR ) 100-50 MCG/ACT AEPB Inhale 1 puff into the lungs 2 (two) times daily. 60 each 1  levonorgestrel (MIRENA) 20 MCG/DAY IUD 1 each by Intrauterine route once.     LORazepam  (ATIVAN ) 0.5 MG tablet Take 1 tablet (0.5 mg total) by mouth daily as needed for anxiety. Second line after propranolol  30 tablet 0   propranolol  (INDERAL ) 10 MG tablet Take 1 tablet (10 mg total) by mouth 3 (three) times daily as needed (anxiety). 90 tablet 2   topiramate  (TOPAMAX ) 100 MG tablet Take 1 tablet in AM, 1 and 1/2 tablets in PM 225 tablet 3   No current facility-administered medications on file prior to visit.    ALLERGIES: Allergies  Allergen Reactions   Bupropion  Nausea And Vomiting and Other (See Comments)   Celebrex  [Celecoxib ] Other (See Comments)    Caused wheezing    Lamotrigine Rash    Rash on  left side of face Rash on left side of face Rash on left side of face Rash on left side of face    Celexa  [Citalopram  Hydrobromide] Diarrhea and Nausea And Vomiting   Other     Dairy     FAMILY HISTORY: Family History  Problem Relation Age of Onset   Mental illness Mother    Drug abuse Mother        overmedicating rx by psych   Miscarriages / [place] Mother    Depression Mother    Arthritis Mother    Diabetes Father    Arthritis Maternal Grandmother    Asthma Maternal Grandmother     SOCIAL HISTORY: Social History   Socioeconomic History   Marital status: Single    Spouse name: Not on file   Number of children: Not on file   Years of education: Not on file   Highest education level: Not on file  Occupational History   Not on file  Tobacco Use   Smoking status: Never    Passive exposure: Current   Smokeless tobacco: Never  Vaping Use   Vaping status: Never Used  Substance and Sexual Activity   Alcohol use: Not Currently    Comment: socially   Drug use: No   Sexual activity: Not on file  Other Topics Concern   Not on file  Social History Narrative   Are you right handed or left handed? Right    Are you currently employed ? yes   What is your current occupation? Part time [employer]    Do you live at home alone? no   Who lives with you? Lives with boyfriend    What type of home do you live in: 1 story or 2 story?  1 story few steps going in        Social Drivers of Corporate investment banker Strain: Not on file  Food Insecurity: Not on file  Transportation Needs: Not on file  Physical Activity: Not on file  Stress: Not on file  Social Connections: Not on file  Intimate Partner Violence: Not on file     PHYSICAL EXAM: Vitals:   03/23/24 1000  BP: 105/76  Pulse: 67  Resp: 20  SpO2: 97%   General: No acute distress, anxious and tearful at times Head:  Normocephalic/atraumatic Skin/Extremities: No rash, no edema Neurological Exam: alert and  awake. No aphasia or dysarthria. Fund of knowledge is appropriate.  Attention and concentration are normal.   Cranial nerves: Pupils equal, round. Extraocular movements intact with no nystagmus. Visual fields full.  No facial asymmetry.  Motor: Bulk and tone normal, muscle strength 5/5 throughout with no pronator drift.  Finger to nose testing intact.  Gait narrow-based and steady, able to tandem walk adequately.  Romberg negative.   IMPRESSION: This is a pleasant 33 yo RH woman with a history of fibromyalgia, anxiety, depression, with epilepsy. She had been seizure-free for 4 years until 09/17/2023. Initial MRI in 2021 showed changes in the left temporal lobe, repeat MRI and 24-hour EEG will be done once insurance issues allow. She denies any convulsions since 08/2023 however has had recurrent episodes of aphasia in the setting of significant stress. Increase Topiramate  to 150mg  BID. This may help with anxiety as well. She was advised to seek Behavioral Health care with [facility]. She is aware of [place] driving laws to stop driving after a seizure until 6 months seizure-free. Follow-up in 3-4 months, call for any changes.   Thank you for allowing me to participate in her care.  Please do not hesitate to call for any questions or concerns.    Darice Shivers, M.D.   CC: Boby Mackintosh, NP-C

## 2024-03-23 NOTE — Patient Instructions (Addendum)
 Good to see you.  Increase Topiramate  100mg : take 1 and 1/2 tablet twice a day  2. Please see [facility] for Psychiatry and therapists  3. Follow-up in 3-4 months, call for any changes   Seizure Precautions: 1. If medication has been prescribed for you to prevent seizures, take it exactly as directed.  Do not stop taking the medicine without talking to your doctor first, even if you have not had a seizure in a long time.   2. Avoid activities in which a seizure would cause danger to yourself or to others.  Don't operate dangerous machinery, swim alone, or climb in high or dangerous places, such as on ladders, roofs, or girders.  Do not drive unless your doctor says you may.  3. If you have any warning that you may have a seizure, lay down in a safe place where you can't hurt yourself.    4.  No driving for 6 months from last seizure, as per [place]  state law.   Please refer to the following link on the Epilepsy Foundation of America's website for more information: http://www.epilepsyfoundation.org/answerplace/Social/driving/drivingu.cfm   5.  Maintain good sleep hygiene. Avoid alcohol.  6.  Notify your neurology if you are planning pregnancy or if you become pregnant.  7.  Contact your doctor if you have any problems that may be related to the medicine you are taking.  8.  Call 911 and bring the patient back to the ED if:        A.  The seizure lasts longer than 5 minutes.       B.  The patient doesn't awaken shortly after the seizure  C.  The patient has new problems such as difficulty seeing, speaking or moving  D.  The patient was injured during the seizure  E.  The patient has a temperature over 102 F (39C)  F.  The patient vomited and now is having trouble breathing

## 2024-03-27 ENCOUNTER — Other Ambulatory Visit: Payer: Self-pay | Admitting: Family Medicine

## 2024-03-27 DIAGNOSIS — J4520 Mild intermittent asthma, uncomplicated: Secondary | ICD-10-CM

## 2024-03-29 ENCOUNTER — Other Ambulatory Visit (HOSPITAL_COMMUNITY): Payer: Self-pay

## 2024-03-29 MED ORDER — ALBUTEROL SULFATE HFA 108 (90 BASE) MCG/ACT IN AERS
2.0000 | INHALATION_SPRAY | Freq: Four times a day (QID) | RESPIRATORY_TRACT | 0 refills | Status: DC | PRN
  Filled 2024-03-29: qty 6.7, 20d supply, fill #0

## 2024-05-19 ENCOUNTER — Other Ambulatory Visit (HOSPITAL_COMMUNITY): Payer: Self-pay

## 2024-05-19 ENCOUNTER — Other Ambulatory Visit (HOSPITAL_BASED_OUTPATIENT_CLINIC_OR_DEPARTMENT_OTHER): Payer: Self-pay

## 2024-07-04 ENCOUNTER — Other Ambulatory Visit (HOSPITAL_COMMUNITY): Payer: Self-pay

## 2024-07-05 ENCOUNTER — Other Ambulatory Visit (HOSPITAL_COMMUNITY): Payer: Self-pay

## 2024-07-12 ENCOUNTER — Encounter: Payer: Self-pay | Admitting: Neurology

## 2024-07-12 ENCOUNTER — Other Ambulatory Visit (HOSPITAL_COMMUNITY): Payer: Self-pay

## 2024-07-12 ENCOUNTER — Ambulatory Visit: Payer: Self-pay | Admitting: Neurology

## 2024-07-12 VITALS — BP 89/62 | HR 67 | Wt 209.0 lb

## 2024-07-12 DIAGNOSIS — G40109 Localization-related (focal) (partial) symptomatic epilepsy and epileptic syndromes with simple partial seizures, not intractable, without status epilepticus: Secondary | ICD-10-CM

## 2024-07-12 MED ORDER — TOPIRAMATE 100 MG PO TABS
150.0000 mg | ORAL_TABLET | Freq: Two times a day (BID) | ORAL | 3 refills | Status: AC
  Filled 2024-07-12: qty 270, 90d supply, fill #0

## 2024-07-12 NOTE — Patient Instructions (Addendum)
 It's good to see you.  Continue Topiramate  100mg : take 1 and 1/2 tablets twice a day  2. Follow-up in 4-5 months, call for any changes   Seizure Precautions: 1. If medication has been prescribed for you to prevent seizures, take it exactly as directed.  Do not stop taking the medicine without talking to your doctor first, even if you have not had a seizure in a long time.   2. Avoid activities in which a seizure would cause danger to yourself or to others.  Don't operate dangerous machinery, swim alone, or climb in high or dangerous places, such as on ladders, roofs, or girders.  Do not drive unless your doctor says you may.  3. If you have any warning that you may have a seizure, lay down in a safe place where you can't hurt yourself.    4.  No driving for 6 months from last seizure, as per [place]  state law.   Please refer to the following link on the [employer]'s website for more information: http://www.epilepsyfoundation.org/answerplace/Social/driving/drivingu.cfm   5.  Maintain good sleep hygiene. Avoid alcohol.  6.  Notify your neurology if you are planning pregnancy or if you become pregnant.  7.  Contact your doctor if you have any problems that may be related to the medicine you are taking.  8.  Call 911 and bring the patient back to the ED if:        A.  The seizure lasts longer than 5 minutes.       B.  The patient doesn't awaken shortly after the seizure  C.  The patient has new problems such as difficulty seeing, speaking or moving  D.  The patient was injured during the seizure  E.  The patient has a temperature over 102 F (39C)  F.  The patient vomited and now is having trouble breathing

## 2024-07-12 NOTE — Progress Notes (Unsigned)
 NEUROLOGY FOLLOW UP OFFICE NOTE  Carolyn Miranda [id number] [birth year]  HISTORY OF PRESENT ILLNESS: I had the pleasure of seeing Carolyn Miranda in follow-up in the neurology clinic on 07/12/2024.  The patient was last seen 4 months ago for epilepsy. She is alone in the office today.  Records and images were personally reviewed where available.  ***.    I had the pleasure of seeing Carolyn Miranda in follow-up in the neurology clinic on 03/23/2024.  The patient was last seen 3 months ago for epilepsy. She is again  accompanied by her mother who helps supplement the history today.  Records and images were personally reviewed where available.  On her last visit, Topiramate  was increased to 100mg  in AM, 150mg  in PM. Repeat MRI and 24-hour EEG were ordered however not completed due to insurance issues. She was in the ER on 01/03/24 after an episode where she was unable to speak.She was getting ready for work, talking to her boyfriend, then suddenly she could not form any words. She could repeat a couple of words and tried to type, but was only able to type 3 words that did not make sense. Symptoms lasted 15-20 minutes, no associated headache, focal numbness/tingling/weakness, loss of awareness. She took Ativan  and felt better. It has happened a couple of times since then, the last was a couple of weeks ago while in line at [employer] and she could not talk. They have all occurred before going to work, which has been an increasingly stressful environment for her. Despite letters from her physicians, she states her job is refusing to accommodate her, forcing her to go on medical leave, which is adding to her stress level. She wakes up every single day in a panic, sick to her stomach.Her last day at work was 8/20, she has not had any further speech changes since then. She reports a fall in the bathtub yesterday.    History on Initial Assessment 12/15/2023: This is a pleasant 33 year old right-handed woman with  a history of epilepsy, fibromyalgia, anxiety, depression, presenting to establish care for seizures. Records were reviewed. The first seizure occurred in January 2021, she woke up feeling off that morning and was putting on her makeup when her mother witnessed her start having head deviation to the right followed by tonic stiffening. 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 She was seen by neurologist Dr. 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 She was started on seizure medication after she had a second seizure in March 2021, her mother reports she was staring and not responding then again had head deviation to the right with tonic stiffening. She  bit the left side of her tongue. She was on Topiramate  100mg  BID without side effects or seizures for almost 4 years until she had a seizure on 09/17/23. No prior warning symptoms, her boyfriend heard her making sounds and found her convulsing with water going down her mouth. It lasted 2 minutes, she was brought to the ER where  bloodwork was unremarkable. Topiramate  level was 3.5. She denied missing any medications, no sleep deprivation. There was more stress recently. The weekend prior, she also had a GI illness with diarrhea and some vomiting. She saw her prior neurologist at [facility] and EEG was ordered which was normal on 09/2023.   She denies any other episodes of staring/unresponsiveness, no loss of time, olfactory/gustatory hallucinations, deja vu, rising epigastric sensation, focal numbness/tingling/weakness, myoclonic jerks in the daytime. She has noticed hypnic jerks as she is going to sleep. She denies any significant headaches, she has had more recently which she attributes to the weather. She has some dizziness which she feels is related to the fibromyalgia. She has chronic neck/back/diffuse body pains. No diplopia, dysarthria/dysphagia, bowel/bladder dysfunction. She has trouble with sleep but tries to get 6-8 hours of sleep. She feels sleepy a lot. Memory is bad. She has to write everything down. She denies missing medications. She works as a conservation officer, nature at [employer]. No pregnancy plans, she has an IUD.   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   CT sinuses showed small area of asymmetric dehiscence of the floor of the left middle cranial fossa, inseparable from the foramen spongiosum, corresponds to the previously described site of focal inferior  descent of the anterior left temporal lobe.  EEG 09/2023 normal   PAST MEDICAL HISTORY: Past Medical History:  Diagnosis Date   Allergies    Anxiety    Asthma    Depression    Migraines    Seizure (HCC)    Urine incontinence     MEDICATIONS: Medications Ordered Prior to Encounter[1]  ALLERGIES: Allergies[2]  FAMILY HISTORY: Family History  Problem Relation Age of Onset   Mental illness Mother    Drug abuse Mother        overmedicating rx by psych   Miscarriages / Stillbirths Mother    Depression Mother    Arthritis Mother    Diabetes Father    Arthritis Maternal Grandmother    Asthma Maternal Grandmother     SOCIAL HISTORY: Social History   Socioeconomic History   Marital status: Single    Spouse name: Not on file   Number of children: Not on file   Years of education: Not on file   Highest education level: Not on file  Occupational History   Not on file  Tobacco Use   Smoking status: Never    Passive exposure: Current   Smokeless tobacco: Never  Vaping Use   Vaping status: Never Used  Substance and Sexual Activity   Alcohol use: Not Currently    Comment: socially   Drug use: No   Sexual activity: Not  on file  Other Topics Concern   Not on file  Social History Narrative   Are you right handed or left handed? Right    Are you currently employed ? yes   What is your current occupation? Part time [employer]    Do you live at home alone? no   Who lives with you? Lives with boyfriend    What type of home do you live in: 1 story or 2 story?  1 story few steps going in        Social Drivers of Health   Tobacco Use: Medium Risk (07/12/2024)   Patient History    Smoking Tobacco Use: Never    Smokeless Tobacco Use: Never    Passive Exposure: Current  Financial Resource Strain: Not on file  Food Insecurity: Not on file  Transportation Needs: Not on file  Physical Activity: Not on file  Stress: Not on file  Social Connections: Not on file  Intimate  Partner Violence: Not on file  Depression (PHQ2-9): Low Risk (05/26/2023)   Depression (PHQ2-9)    PHQ-2 Score: 4  Recent Concern: Depression (PHQ2-9) - High Risk (05/06/2023)   Depression (PHQ2-9)    PHQ-2 Score: 14  Alcohol Screen: Not on file  Housing: Not on file  Utilities: Not on file  Health Literacy: Not on file     PHYSICAL EXAM: Vitals:   07/12/24 1555  BP: (!) 89/62  Pulse: 67  SpO2: 98%   General: No acute distress Head:  Normocephalic/atraumatic Skin/Extremities: No rash, no edema Neurological Exam: alert and oriented to person, place, and time. No aphasia or dysarthria. Fund of knowledge is appropriate.  Recent and remote memory are intact.  Attention and concentration are normal.   Cranial nerves: Pupils equal, round. Extraocular movements intact with no nystagmus. Visual fields full.  No facial asymmetry.  Motor: Bulk and tone normal, muscle strength 5/5 throughout with no pronator drift.   Finger to nose testing intact.  Gait narrow-based and steady, able to tandem walk adequately.  Romberg negative.   IMPRESSION: This is a pleasant 33 yo RH woman with a history of fibromyalgia, anxiety, depression, with epilepsy. She had been seizure-free for 4 years until 09/17/2023. Initial MRI in 2021 showed changes in the left temporal lobe, repeat MRI and 24-hour EEG will be done once insurance issues allow. She denies any convulsions since 08/2023 however has had recurrent episodes of aphasia in the setting of significant stress. Increase Topiramate  to 150mg  BID. This may help with anxiety as well. She was advised to seek Behavioral Health care with [facility]. She is aware of [place] driving laws to stop driving after a seizure until 6 months seizure-free. Follow-up in 3-4 months, call for any changes.     Thank you for allowing me to participate in *** care.  Please do not hesitate to call for any questions or concerns.  The duration of this appointment visit was ***  minutes of face-to-face time with the patient.  Greater than 50% of this time was spent in counseling, explanation of diagnosis, planning of further management, and coordination of care.   Darice Shivers, M.D.   CC: ***       [1]  Current Outpatient Medications on File Prior to Visit  Medication Sig Dispense Refill   albuterol  (VENTOLIN  HFA) 108 (90 Base) MCG/ACT inhaler Inhale 1 puff into the lungs every 4 (four) hours. 18 g 3   albuterol  (VENTOLIN  HFA) 108 (90 Base) MCG/ACT inhaler Inhale  2 puffs into the lungs every 6 (six) hours as needed for wheezing or shortness of breath. 6.7 g 0   cyclobenzaprine  (FLEXERIL ) 5 MG tablet Take 1 tablet (5 mg total) by mouth at bedtime as needed for muscle spasms. 30 tablet 1   diclofenac  Sodium (VOLTAREN  ARTHRITIS PAIN) 1 % GEL Apply 4 g topically 4 (four) times daily. 150 g 3   levonorgestrel (MIRENA) 20 MCG/DAY IUD 1 each by Intrauterine route once.     topiramate  (TOPAMAX ) 100 MG tablet Take 1.5 tablets (150 mg total) by mouth 2 (two) times daily. 270 tablet 3   clindamycin  (CLINDAGEL ) 1 % gel APPLY A THIN LAYER TO THE AFFECTED AREA(S) BY TOPICAL ROUTE 2 TIMES PER DAY (Patient not taking: Reported on 07/12/2024) 30 g 3   DULoxetine  (CYMBALTA ) 30 MG capsule Take 1 capsule (30 mg total) by mouth daily. Take with a 60 mg dose for a total of 90 mg daily 30 capsule 2   DULoxetine  (CYMBALTA ) 60 MG capsule Take 1 capsule (60 mg total) by mouth daily. Take 60 mg in the evening 30 capsule 2   fluticasone  (FLONASE ) 50 MCG/ACT nasal spray Place 2 sprays into both nostrils daily. (Patient not taking: Reported on 07/12/2024) 16 g 6   fluticasone -salmeterol (ADVAIR ) 100-50 MCG/ACT AEPB Inhale 1 puff into the lungs 2 (two) times daily. (Patient not taking: Reported on 07/12/2024) 60 each 1   LORazepam  (ATIVAN ) 0.5 MG tablet Take 1 tablet (0.5 mg total) by mouth daily as needed for anxiety. Second line after propranolol  30 tablet 0   propranolol  (INDERAL ) 10 MG  tablet Take 1 tablet (10 mg total) by mouth 3 (three) times daily as needed (anxiety). 90 tablet 2   No current facility-administered medications on file prior to visit.  [2]  Allergies Allergen Reactions   Bupropion  Nausea And Vomiting and Other (See Comments)   Celebrex  [Celecoxib ] Other (See Comments)    Caused wheezing    Lamotrigine Rash    Rash on left side of face Rash on left side of face Rash on left side of face Rash on left side of face    Celexa  [Citalopram  Hydrobromide] Diarrhea and Nausea And Vomiting   Other     Dairy

## 2024-07-13 ENCOUNTER — Other Ambulatory Visit (HOSPITAL_COMMUNITY): Payer: Self-pay

## 2024-07-21 ENCOUNTER — Other Ambulatory Visit: Payer: Self-pay | Admitting: Family Medicine

## 2024-07-21 DIAGNOSIS — J4520 Mild intermittent asthma, uncomplicated: Secondary | ICD-10-CM

## 2024-07-22 ENCOUNTER — Other Ambulatory Visit (HOSPITAL_COMMUNITY): Payer: Self-pay

## 2024-07-22 MED ORDER — ALBUTEROL SULFATE HFA 108 (90 BASE) MCG/ACT IN AERS
2.0000 | INHALATION_SPRAY | Freq: Four times a day (QID) | RESPIRATORY_TRACT | 0 refills | Status: AC | PRN
  Filled 2024-07-22: qty 6.7, 20d supply, fill #0

## 2024-09-02 ENCOUNTER — Encounter: Payer: Self-pay | Admitting: Neurology

## 2025-01-25 ENCOUNTER — Ambulatory Visit: Payer: Self-pay | Admitting: Neurology
